# Patient Record
Sex: Female | Born: 1945 | Race: White | Hispanic: No | Marital: Married | Smoking: Never smoker
Health system: Southern US, Community
[De-identification: ages and names within clinical notes are randomized; demographics above are authoritative.]

## PROBLEM LIST (undated history)

## (undated) DIAGNOSIS — I2510 Atherosclerotic heart disease of native coronary artery without angina pectoris: Secondary | ICD-10-CM

## (undated) DIAGNOSIS — I499 Cardiac arrhythmia, unspecified: Secondary | ICD-10-CM

## (undated) DIAGNOSIS — Z9889 Other specified postprocedural states: Secondary | ICD-10-CM

## (undated) DIAGNOSIS — K219 Gastro-esophageal reflux disease without esophagitis: Secondary | ICD-10-CM

## (undated) DIAGNOSIS — M1990 Unspecified osteoarthritis, unspecified site: Secondary | ICD-10-CM

## (undated) DIAGNOSIS — I10 Essential (primary) hypertension: Secondary | ICD-10-CM

## (undated) DIAGNOSIS — R112 Nausea with vomiting, unspecified: Secondary | ICD-10-CM

## (undated) DIAGNOSIS — F419 Anxiety disorder, unspecified: Secondary | ICD-10-CM

## (undated) HISTORY — PX: FRACTURE SURGERY: SHX138

## (undated) HISTORY — PX: CHOLECYSTECTOMY: SHX55

## (undated) HISTORY — PX: TUBAL LIGATION: SHX77

## (undated) HISTORY — PX: EYE SURGERY: SHX253

## (undated) HISTORY — PX: BACK SURGERY: SHX140

---

## 2014-05-24 DIAGNOSIS — K219 Gastro-esophageal reflux disease without esophagitis: Secondary | ICD-10-CM | POA: Diagnosis not present

## 2014-05-24 DIAGNOSIS — J209 Acute bronchitis, unspecified: Secondary | ICD-10-CM | POA: Diagnosis not present

## 2014-08-09 DIAGNOSIS — I10 Essential (primary) hypertension: Secondary | ICD-10-CM | POA: Diagnosis not present

## 2014-08-09 DIAGNOSIS — Z0000 Encounter for general adult medical examination without abnormal findings: Secondary | ICD-10-CM | POA: Diagnosis not present

## 2014-08-09 DIAGNOSIS — D649 Anemia, unspecified: Secondary | ICD-10-CM | POA: Diagnosis not present

## 2014-08-09 DIAGNOSIS — E780 Pure hypercholesterolemia: Secondary | ICD-10-CM | POA: Diagnosis not present

## 2014-08-09 DIAGNOSIS — Z79899 Other long term (current) drug therapy: Secondary | ICD-10-CM | POA: Diagnosis not present

## 2014-08-09 DIAGNOSIS — E876 Hypokalemia: Secondary | ICD-10-CM | POA: Diagnosis not present

## 2014-08-21 DIAGNOSIS — Z1231 Encounter for screening mammogram for malignant neoplasm of breast: Secondary | ICD-10-CM | POA: Diagnosis not present

## 2014-09-05 DIAGNOSIS — K5730 Diverticulosis of large intestine without perforation or abscess without bleeding: Secondary | ICD-10-CM | POA: Diagnosis not present

## 2014-09-05 DIAGNOSIS — Z86010 Personal history of colonic polyps: Secondary | ICD-10-CM | POA: Diagnosis not present

## 2014-09-05 DIAGNOSIS — R194 Change in bowel habit: Secondary | ICD-10-CM | POA: Diagnosis not present

## 2014-10-04 DIAGNOSIS — H524 Presbyopia: Secondary | ICD-10-CM | POA: Diagnosis not present

## 2014-10-04 DIAGNOSIS — H5210 Myopia, unspecified eye: Secondary | ICD-10-CM | POA: Diagnosis not present

## 2014-10-15 DIAGNOSIS — N12 Tubulo-interstitial nephritis, not specified as acute or chronic: Secondary | ICD-10-CM | POA: Diagnosis not present

## 2014-10-17 DIAGNOSIS — N12 Tubulo-interstitial nephritis, not specified as acute or chronic: Secondary | ICD-10-CM | POA: Diagnosis not present

## 2014-11-21 DIAGNOSIS — M545 Low back pain: Secondary | ICD-10-CM | POA: Diagnosis not present

## 2014-11-21 DIAGNOSIS — N390 Urinary tract infection, site not specified: Secondary | ICD-10-CM | POA: Diagnosis not present

## 2014-11-28 DIAGNOSIS — M546 Pain in thoracic spine: Secondary | ICD-10-CM | POA: Diagnosis not present

## 2014-11-28 DIAGNOSIS — M5489 Other dorsalgia: Secondary | ICD-10-CM | POA: Diagnosis not present

## 2014-11-29 DIAGNOSIS — M5489 Other dorsalgia: Secondary | ICD-10-CM | POA: Diagnosis not present

## 2014-11-29 DIAGNOSIS — M546 Pain in thoracic spine: Secondary | ICD-10-CM | POA: Diagnosis not present

## 2014-12-03 DIAGNOSIS — M546 Pain in thoracic spine: Secondary | ICD-10-CM | POA: Diagnosis not present

## 2014-12-03 DIAGNOSIS — M5489 Other dorsalgia: Secondary | ICD-10-CM | POA: Diagnosis not present

## 2014-12-07 DIAGNOSIS — M546 Pain in thoracic spine: Secondary | ICD-10-CM | POA: Diagnosis not present

## 2014-12-07 DIAGNOSIS — M5489 Other dorsalgia: Secondary | ICD-10-CM | POA: Diagnosis not present

## 2014-12-12 DIAGNOSIS — M546 Pain in thoracic spine: Secondary | ICD-10-CM | POA: Diagnosis not present

## 2014-12-12 DIAGNOSIS — M5489 Other dorsalgia: Secondary | ICD-10-CM | POA: Diagnosis not present

## 2014-12-14 DIAGNOSIS — M5489 Other dorsalgia: Secondary | ICD-10-CM | POA: Diagnosis not present

## 2014-12-14 DIAGNOSIS — M546 Pain in thoracic spine: Secondary | ICD-10-CM | POA: Diagnosis not present

## 2014-12-17 DIAGNOSIS — M5489 Other dorsalgia: Secondary | ICD-10-CM | POA: Diagnosis not present

## 2014-12-17 DIAGNOSIS — M546 Pain in thoracic spine: Secondary | ICD-10-CM | POA: Diagnosis not present

## 2014-12-20 DIAGNOSIS — M546 Pain in thoracic spine: Secondary | ICD-10-CM | POA: Diagnosis not present

## 2014-12-20 DIAGNOSIS — M5489 Other dorsalgia: Secondary | ICD-10-CM | POA: Diagnosis not present

## 2014-12-24 DIAGNOSIS — M5489 Other dorsalgia: Secondary | ICD-10-CM | POA: Diagnosis not present

## 2014-12-24 DIAGNOSIS — M546 Pain in thoracic spine: Secondary | ICD-10-CM | POA: Diagnosis not present

## 2014-12-28 DIAGNOSIS — M5489 Other dorsalgia: Secondary | ICD-10-CM | POA: Diagnosis not present

## 2014-12-28 DIAGNOSIS — M546 Pain in thoracic spine: Secondary | ICD-10-CM | POA: Diagnosis not present

## 2014-12-31 DIAGNOSIS — N390 Urinary tract infection, site not specified: Secondary | ICD-10-CM | POA: Diagnosis not present

## 2014-12-31 DIAGNOSIS — M5489 Other dorsalgia: Secondary | ICD-10-CM | POA: Diagnosis not present

## 2014-12-31 DIAGNOSIS — Z23 Encounter for immunization: Secondary | ICD-10-CM | POA: Diagnosis not present

## 2014-12-31 DIAGNOSIS — M546 Pain in thoracic spine: Secondary | ICD-10-CM | POA: Diagnosis not present

## 2015-01-02 DIAGNOSIS — M546 Pain in thoracic spine: Secondary | ICD-10-CM | POA: Diagnosis not present

## 2015-01-02 DIAGNOSIS — M5489 Other dorsalgia: Secondary | ICD-10-CM | POA: Diagnosis not present

## 2015-01-07 DIAGNOSIS — M546 Pain in thoracic spine: Secondary | ICD-10-CM | POA: Diagnosis not present

## 2015-01-07 DIAGNOSIS — M5489 Other dorsalgia: Secondary | ICD-10-CM | POA: Diagnosis not present

## 2015-01-09 DIAGNOSIS — M5489 Other dorsalgia: Secondary | ICD-10-CM | POA: Diagnosis not present

## 2015-01-09 DIAGNOSIS — M546 Pain in thoracic spine: Secondary | ICD-10-CM | POA: Diagnosis not present

## 2015-01-14 DIAGNOSIS — M5489 Other dorsalgia: Secondary | ICD-10-CM | POA: Diagnosis not present

## 2015-01-14 DIAGNOSIS — M546 Pain in thoracic spine: Secondary | ICD-10-CM | POA: Diagnosis not present

## 2015-01-16 DIAGNOSIS — M5489 Other dorsalgia: Secondary | ICD-10-CM | POA: Diagnosis not present

## 2015-01-16 DIAGNOSIS — M546 Pain in thoracic spine: Secondary | ICD-10-CM | POA: Diagnosis not present

## 2015-05-20 DIAGNOSIS — M25551 Pain in right hip: Secondary | ICD-10-CM | POA: Diagnosis not present

## 2015-05-20 DIAGNOSIS — M5431 Sciatica, right side: Secondary | ICD-10-CM | POA: Diagnosis not present

## 2015-05-20 DIAGNOSIS — M5136 Other intervertebral disc degeneration, lumbar region: Secondary | ICD-10-CM | POA: Diagnosis not present

## 2015-05-20 DIAGNOSIS — M4807 Spinal stenosis, lumbosacral region: Secondary | ICD-10-CM | POA: Diagnosis not present

## 2015-08-28 DIAGNOSIS — D649 Anemia, unspecified: Secondary | ICD-10-CM | POA: Diagnosis not present

## 2015-08-28 DIAGNOSIS — Z79899 Other long term (current) drug therapy: Secondary | ICD-10-CM | POA: Diagnosis not present

## 2015-08-28 DIAGNOSIS — Z1231 Encounter for screening mammogram for malignant neoplasm of breast: Secondary | ICD-10-CM | POA: Diagnosis not present

## 2015-08-28 DIAGNOSIS — E876 Hypokalemia: Secondary | ICD-10-CM | POA: Diagnosis not present

## 2015-08-28 DIAGNOSIS — I10 Essential (primary) hypertension: Secondary | ICD-10-CM | POA: Diagnosis not present

## 2015-08-28 DIAGNOSIS — E785 Hyperlipidemia, unspecified: Secondary | ICD-10-CM | POA: Diagnosis not present

## 2015-08-28 DIAGNOSIS — M545 Low back pain: Secondary | ICD-10-CM | POA: Diagnosis not present

## 2015-08-28 DIAGNOSIS — R635 Abnormal weight gain: Secondary | ICD-10-CM | POA: Diagnosis not present

## 2015-08-28 DIAGNOSIS — Z0000 Encounter for general adult medical examination without abnormal findings: Secondary | ICD-10-CM | POA: Diagnosis not present

## 2015-09-06 DIAGNOSIS — Z1231 Encounter for screening mammogram for malignant neoplasm of breast: Secondary | ICD-10-CM | POA: Diagnosis not present

## 2015-09-25 DIAGNOSIS — N6489 Other specified disorders of breast: Secondary | ICD-10-CM | POA: Diagnosis not present

## 2015-09-25 DIAGNOSIS — R928 Other abnormal and inconclusive findings on diagnostic imaging of breast: Secondary | ICD-10-CM | POA: Diagnosis not present

## 2015-10-29 DIAGNOSIS — Z9181 History of falling: Secondary | ICD-10-CM | POA: Diagnosis not present

## 2015-10-29 DIAGNOSIS — R635 Abnormal weight gain: Secondary | ICD-10-CM | POA: Diagnosis not present

## 2015-10-29 DIAGNOSIS — Z1389 Encounter for screening for other disorder: Secondary | ICD-10-CM | POA: Diagnosis not present

## 2015-11-21 DIAGNOSIS — H524 Presbyopia: Secondary | ICD-10-CM | POA: Diagnosis not present

## 2015-11-21 DIAGNOSIS — H5210 Myopia, unspecified eye: Secondary | ICD-10-CM | POA: Diagnosis not present

## 2015-12-11 DIAGNOSIS — F410 Panic disorder [episodic paroxysmal anxiety] without agoraphobia: Secondary | ICD-10-CM | POA: Diagnosis not present

## 2015-12-11 DIAGNOSIS — J0120 Acute ethmoidal sinusitis, unspecified: Secondary | ICD-10-CM | POA: Diagnosis not present

## 2016-01-09 DIAGNOSIS — Z23 Encounter for immunization: Secondary | ICD-10-CM | POA: Diagnosis not present

## 2016-01-22 DIAGNOSIS — J0120 Acute ethmoidal sinusitis, unspecified: Secondary | ICD-10-CM | POA: Diagnosis not present

## 2016-11-24 DIAGNOSIS — K219 Gastro-esophageal reflux disease without esophagitis: Secondary | ICD-10-CM | POA: Diagnosis not present

## 2016-11-24 DIAGNOSIS — Z6823 Body mass index (BMI) 23.0-23.9, adult: Secondary | ICD-10-CM | POA: Diagnosis not present

## 2016-11-24 DIAGNOSIS — F410 Panic disorder [episodic paroxysmal anxiety] without agoraphobia: Secondary | ICD-10-CM | POA: Diagnosis not present

## 2016-11-24 DIAGNOSIS — Z0000 Encounter for general adult medical examination without abnormal findings: Secondary | ICD-10-CM | POA: Diagnosis not present

## 2016-11-24 DIAGNOSIS — Z79899 Other long term (current) drug therapy: Secondary | ICD-10-CM | POA: Diagnosis not present

## 2016-11-24 DIAGNOSIS — I10 Essential (primary) hypertension: Secondary | ICD-10-CM | POA: Diagnosis not present

## 2016-11-24 DIAGNOSIS — Z9181 History of falling: Secondary | ICD-10-CM | POA: Diagnosis not present

## 2016-11-24 DIAGNOSIS — E785 Hyperlipidemia, unspecified: Secondary | ICD-10-CM | POA: Diagnosis not present

## 2016-11-24 DIAGNOSIS — Z86010 Personal history of colonic polyps: Secondary | ICD-10-CM | POA: Diagnosis not present

## 2016-11-26 DIAGNOSIS — H5213 Myopia, bilateral: Secondary | ICD-10-CM | POA: Diagnosis not present

## 2016-12-07 DIAGNOSIS — H2513 Age-related nuclear cataract, bilateral: Secondary | ICD-10-CM | POA: Diagnosis not present

## 2016-12-07 DIAGNOSIS — H2511 Age-related nuclear cataract, right eye: Secondary | ICD-10-CM | POA: Diagnosis not present

## 2016-12-24 DIAGNOSIS — Z1231 Encounter for screening mammogram for malignant neoplasm of breast: Secondary | ICD-10-CM | POA: Diagnosis not present

## 2017-01-06 DIAGNOSIS — Z23 Encounter for immunization: Secondary | ICD-10-CM | POA: Diagnosis not present

## 2017-01-14 DIAGNOSIS — H2511 Age-related nuclear cataract, right eye: Secondary | ICD-10-CM | POA: Diagnosis not present

## 2017-01-15 DIAGNOSIS — H2512 Age-related nuclear cataract, left eye: Secondary | ICD-10-CM | POA: Diagnosis not present

## 2017-01-28 DIAGNOSIS — H2512 Age-related nuclear cataract, left eye: Secondary | ICD-10-CM | POA: Diagnosis not present

## 2017-02-05 DIAGNOSIS — S3991XA Unspecified injury of abdomen, initial encounter: Secondary | ICD-10-CM | POA: Diagnosis not present

## 2017-02-05 DIAGNOSIS — S31609A Unspecified open wound of abdominal wall, unspecified quadrant with penetration into peritoneal cavity, initial encounter: Secondary | ICD-10-CM | POA: Diagnosis not present

## 2017-02-05 DIAGNOSIS — S0990XA Unspecified injury of head, initial encounter: Secondary | ICD-10-CM | POA: Diagnosis not present

## 2017-02-05 DIAGNOSIS — R42 Dizziness and giddiness: Secondary | ICD-10-CM | POA: Diagnosis not present

## 2017-02-05 DIAGNOSIS — S199XXA Unspecified injury of neck, initial encounter: Secondary | ICD-10-CM | POA: Diagnosis not present

## 2017-02-05 DIAGNOSIS — M79604 Pain in right leg: Secondary | ICD-10-CM | POA: Diagnosis not present

## 2017-02-05 DIAGNOSIS — Z79899 Other long term (current) drug therapy: Secondary | ICD-10-CM | POA: Diagnosis not present

## 2017-02-05 DIAGNOSIS — Z7982 Long term (current) use of aspirin: Secondary | ICD-10-CM | POA: Diagnosis not present

## 2017-02-05 DIAGNOSIS — S3690XA Unspecified injury of unspecified intra-abdominal organ, initial encounter: Secondary | ICD-10-CM | POA: Diagnosis not present

## 2017-02-05 DIAGNOSIS — R109 Unspecified abdominal pain: Secondary | ICD-10-CM | POA: Diagnosis not present

## 2017-02-05 DIAGNOSIS — S2241XA Multiple fractures of ribs, right side, initial encounter for closed fracture: Secondary | ICD-10-CM | POA: Diagnosis not present

## 2017-02-05 DIAGNOSIS — I10 Essential (primary) hypertension: Secondary | ICD-10-CM | POA: Diagnosis not present

## 2017-02-06 DIAGNOSIS — R0781 Pleurodynia: Secondary | ICD-10-CM | POA: Diagnosis not present

## 2017-02-06 DIAGNOSIS — I10 Essential (primary) hypertension: Secondary | ICD-10-CM | POA: Diagnosis not present

## 2017-02-06 DIAGNOSIS — R079 Chest pain, unspecified: Secondary | ICD-10-CM | POA: Diagnosis not present

## 2017-02-06 DIAGNOSIS — Z7982 Long term (current) use of aspirin: Secondary | ICD-10-CM | POA: Diagnosis not present

## 2017-02-06 DIAGNOSIS — Y998 Other external cause status: Secondary | ICD-10-CM | POA: Diagnosis not present

## 2017-02-06 DIAGNOSIS — Z7409 Other reduced mobility: Secondary | ICD-10-CM | POA: Diagnosis not present

## 2017-02-06 DIAGNOSIS — S2241XA Multiple fractures of ribs, right side, initial encounter for closed fracture: Secondary | ICD-10-CM | POA: Diagnosis not present

## 2017-02-06 DIAGNOSIS — Y92410 Unspecified street and highway as the place of occurrence of the external cause: Secondary | ICD-10-CM | POA: Diagnosis not present

## 2017-02-06 DIAGNOSIS — R0602 Shortness of breath: Secondary | ICD-10-CM | POA: Diagnosis not present

## 2017-02-17 DIAGNOSIS — S2241XD Multiple fractures of ribs, right side, subsequent encounter for fracture with routine healing: Secondary | ICD-10-CM | POA: Diagnosis not present

## 2017-03-01 DIAGNOSIS — Z1339 Encounter for screening examination for other mental health and behavioral disorders: Secondary | ICD-10-CM | POA: Diagnosis not present

## 2017-03-01 DIAGNOSIS — Z6823 Body mass index (BMI) 23.0-23.9, adult: Secondary | ICD-10-CM | POA: Diagnosis not present

## 2017-03-01 DIAGNOSIS — M5432 Sciatica, left side: Secondary | ICD-10-CM | POA: Diagnosis not present

## 2017-03-17 DIAGNOSIS — M5431 Sciatica, right side: Secondary | ICD-10-CM | POA: Diagnosis not present

## 2017-03-17 DIAGNOSIS — Z6823 Body mass index (BMI) 23.0-23.9, adult: Secondary | ICD-10-CM | POA: Diagnosis not present

## 2017-03-30 ENCOUNTER — Other Ambulatory Visit: Payer: Self-pay | Admitting: Family Medicine

## 2017-03-30 DIAGNOSIS — M5431 Sciatica, right side: Secondary | ICD-10-CM

## 2017-04-02 ENCOUNTER — Inpatient Hospital Stay
Admission: RE | Admit: 2017-04-02 | Discharge: 2017-04-02 | Disposition: A | Discharge status: Home / Self Care | Payer: Self-pay | Source: Ambulatory Visit | Attending: Family Medicine | Admitting: Family Medicine

## 2017-04-16 ENCOUNTER — Ambulatory Visit
Admission: RE | Admit: 2017-04-16 | Discharge: 2017-04-16 | Disposition: A | Discharge status: Home / Self Care | Payer: Medicare HMO | Source: Ambulatory Visit | Attending: Family Medicine | Admitting: Family Medicine

## 2017-04-16 DIAGNOSIS — M5431 Sciatica, right side: Secondary | ICD-10-CM

## 2017-04-16 DIAGNOSIS — M48061 Spinal stenosis, lumbar region without neurogenic claudication: Secondary | ICD-10-CM | POA: Diagnosis not present

## 2017-04-29 DIAGNOSIS — M4316 Spondylolisthesis, lumbar region: Secondary | ICD-10-CM | POA: Diagnosis not present

## 2017-04-29 DIAGNOSIS — M5416 Radiculopathy, lumbar region: Secondary | ICD-10-CM | POA: Diagnosis not present

## 2017-04-29 DIAGNOSIS — M545 Low back pain: Secondary | ICD-10-CM | POA: Diagnosis not present

## 2017-04-29 DIAGNOSIS — M47816 Spondylosis without myelopathy or radiculopathy, lumbar region: Secondary | ICD-10-CM | POA: Diagnosis not present

## 2017-05-12 DIAGNOSIS — M5416 Radiculopathy, lumbar region: Secondary | ICD-10-CM | POA: Diagnosis not present

## 2017-05-17 DIAGNOSIS — Z961 Presence of intraocular lens: Secondary | ICD-10-CM | POA: Diagnosis not present

## 2017-05-17 DIAGNOSIS — H1820 Unspecified corneal edema: Secondary | ICD-10-CM | POA: Diagnosis not present

## 2017-06-01 DIAGNOSIS — H1851 Endothelial corneal dystrophy: Secondary | ICD-10-CM | POA: Diagnosis not present

## 2017-06-01 DIAGNOSIS — Z961 Presence of intraocular lens: Secondary | ICD-10-CM | POA: Diagnosis not present

## 2017-06-07 DIAGNOSIS — M5416 Radiculopathy, lumbar region: Secondary | ICD-10-CM | POA: Diagnosis not present

## 2017-06-23 DIAGNOSIS — H1851 Endothelial corneal dystrophy: Secondary | ICD-10-CM | POA: Diagnosis not present

## 2017-06-23 DIAGNOSIS — Z961 Presence of intraocular lens: Secondary | ICD-10-CM | POA: Diagnosis not present

## 2017-07-06 DIAGNOSIS — M7062 Trochanteric bursitis, left hip: Secondary | ICD-10-CM | POA: Diagnosis not present

## 2017-07-06 DIAGNOSIS — M47816 Spondylosis without myelopathy or radiculopathy, lumbar region: Secondary | ICD-10-CM | POA: Diagnosis not present

## 2017-07-13 DIAGNOSIS — M7062 Trochanteric bursitis, left hip: Secondary | ICD-10-CM | POA: Diagnosis not present

## 2017-07-13 DIAGNOSIS — M47816 Spondylosis without myelopathy or radiculopathy, lumbar region: Secondary | ICD-10-CM | POA: Diagnosis not present

## 2017-07-15 DIAGNOSIS — M7062 Trochanteric bursitis, left hip: Secondary | ICD-10-CM | POA: Diagnosis not present

## 2017-07-15 DIAGNOSIS — M47816 Spondylosis without myelopathy or radiculopathy, lumbar region: Secondary | ICD-10-CM | POA: Diagnosis not present

## 2017-07-20 DIAGNOSIS — R69 Illness, unspecified: Secondary | ICD-10-CM | POA: Diagnosis not present

## 2017-07-22 DIAGNOSIS — M7062 Trochanteric bursitis, left hip: Secondary | ICD-10-CM | POA: Diagnosis not present

## 2017-07-22 DIAGNOSIS — M47816 Spondylosis without myelopathy or radiculopathy, lumbar region: Secondary | ICD-10-CM | POA: Diagnosis not present

## 2017-07-27 DIAGNOSIS — M7062 Trochanteric bursitis, left hip: Secondary | ICD-10-CM | POA: Diagnosis not present

## 2017-07-27 DIAGNOSIS — M47816 Spondylosis without myelopathy or radiculopathy, lumbar region: Secondary | ICD-10-CM | POA: Diagnosis not present

## 2017-07-29 DIAGNOSIS — M7062 Trochanteric bursitis, left hip: Secondary | ICD-10-CM | POA: Diagnosis not present

## 2017-07-29 DIAGNOSIS — M47816 Spondylosis without myelopathy or radiculopathy, lumbar region: Secondary | ICD-10-CM | POA: Diagnosis not present

## 2017-08-02 DIAGNOSIS — M47816 Spondylosis without myelopathy or radiculopathy, lumbar region: Secondary | ICD-10-CM | POA: Diagnosis not present

## 2017-08-02 DIAGNOSIS — M7062 Trochanteric bursitis, left hip: Secondary | ICD-10-CM | POA: Diagnosis not present

## 2017-08-04 DIAGNOSIS — H04123 Dry eye syndrome of bilateral lacrimal glands: Secondary | ICD-10-CM | POA: Diagnosis not present

## 2017-08-04 DIAGNOSIS — Z961 Presence of intraocular lens: Secondary | ICD-10-CM | POA: Diagnosis not present

## 2017-08-04 DIAGNOSIS — H1851 Endothelial corneal dystrophy: Secondary | ICD-10-CM | POA: Diagnosis not present

## 2017-08-05 DIAGNOSIS — M7062 Trochanteric bursitis, left hip: Secondary | ICD-10-CM | POA: Diagnosis not present

## 2017-08-05 DIAGNOSIS — M47816 Spondylosis without myelopathy or radiculopathy, lumbar region: Secondary | ICD-10-CM | POA: Diagnosis not present

## 2017-08-12 DIAGNOSIS — M7062 Trochanteric bursitis, left hip: Secondary | ICD-10-CM | POA: Diagnosis not present

## 2017-08-12 DIAGNOSIS — M47816 Spondylosis without myelopathy or radiculopathy, lumbar region: Secondary | ICD-10-CM | POA: Diagnosis not present

## 2017-08-13 DIAGNOSIS — M47816 Spondylosis without myelopathy or radiculopathy, lumbar region: Secondary | ICD-10-CM | POA: Diagnosis not present

## 2017-08-13 DIAGNOSIS — M7062 Trochanteric bursitis, left hip: Secondary | ICD-10-CM | POA: Diagnosis not present

## 2017-08-24 DIAGNOSIS — M5416 Radiculopathy, lumbar region: Secondary | ICD-10-CM | POA: Diagnosis not present

## 2017-08-24 DIAGNOSIS — M47816 Spondylosis without myelopathy or radiculopathy, lumbar region: Secondary | ICD-10-CM | POA: Diagnosis not present

## 2017-08-24 DIAGNOSIS — M7062 Trochanteric bursitis, left hip: Secondary | ICD-10-CM | POA: Diagnosis not present

## 2017-08-24 DIAGNOSIS — M4316 Spondylolisthesis, lumbar region: Secondary | ICD-10-CM | POA: Diagnosis not present

## 2017-09-24 DIAGNOSIS — M47816 Spondylosis without myelopathy or radiculopathy, lumbar region: Secondary | ICD-10-CM | POA: Diagnosis not present

## 2017-09-24 DIAGNOSIS — M7062 Trochanteric bursitis, left hip: Secondary | ICD-10-CM | POA: Diagnosis not present

## 2017-09-27 DIAGNOSIS — M47816 Spondylosis without myelopathy or radiculopathy, lumbar region: Secondary | ICD-10-CM | POA: Diagnosis not present

## 2017-09-27 DIAGNOSIS — M7062 Trochanteric bursitis, left hip: Secondary | ICD-10-CM | POA: Diagnosis not present

## 2017-09-29 DIAGNOSIS — M7062 Trochanteric bursitis, left hip: Secondary | ICD-10-CM | POA: Diagnosis not present

## 2017-09-29 DIAGNOSIS — M47816 Spondylosis without myelopathy or radiculopathy, lumbar region: Secondary | ICD-10-CM | POA: Diagnosis not present

## 2017-10-04 DIAGNOSIS — M7062 Trochanteric bursitis, left hip: Secondary | ICD-10-CM | POA: Diagnosis not present

## 2017-10-04 DIAGNOSIS — M47816 Spondylosis without myelopathy or radiculopathy, lumbar region: Secondary | ICD-10-CM | POA: Diagnosis not present

## 2017-10-14 DIAGNOSIS — M47816 Spondylosis without myelopathy or radiculopathy, lumbar region: Secondary | ICD-10-CM | POA: Diagnosis not present

## 2017-10-14 DIAGNOSIS — M7062 Trochanteric bursitis, left hip: Secondary | ICD-10-CM | POA: Diagnosis not present

## 2017-12-01 DIAGNOSIS — I10 Essential (primary) hypertension: Secondary | ICD-10-CM | POA: Diagnosis not present

## 2017-12-01 DIAGNOSIS — K219 Gastro-esophageal reflux disease without esophagitis: Secondary | ICD-10-CM | POA: Diagnosis not present

## 2017-12-01 DIAGNOSIS — M5442 Lumbago with sciatica, left side: Secondary | ICD-10-CM | POA: Diagnosis not present

## 2017-12-01 DIAGNOSIS — Z0000 Encounter for general adult medical examination without abnormal findings: Secondary | ICD-10-CM | POA: Diagnosis not present

## 2017-12-01 DIAGNOSIS — Z23 Encounter for immunization: Secondary | ICD-10-CM | POA: Diagnosis not present

## 2017-12-01 DIAGNOSIS — Z1339 Encounter for screening examination for other mental health and behavioral disorders: Secondary | ICD-10-CM | POA: Diagnosis not present

## 2017-12-01 DIAGNOSIS — E785 Hyperlipidemia, unspecified: Secondary | ICD-10-CM | POA: Diagnosis not present

## 2017-12-01 DIAGNOSIS — Z79899 Other long term (current) drug therapy: Secondary | ICD-10-CM | POA: Diagnosis not present

## 2017-12-01 DIAGNOSIS — G8929 Other chronic pain: Secondary | ICD-10-CM | POA: Diagnosis not present

## 2017-12-14 DIAGNOSIS — H524 Presbyopia: Secondary | ICD-10-CM | POA: Diagnosis not present

## 2017-12-27 DIAGNOSIS — Z1231 Encounter for screening mammogram for malignant neoplasm of breast: Secondary | ICD-10-CM | POA: Diagnosis not present

## 2018-02-17 DIAGNOSIS — M5136 Other intervertebral disc degeneration, lumbar region: Secondary | ICD-10-CM | POA: Diagnosis not present

## 2018-02-17 DIAGNOSIS — M62830 Muscle spasm of back: Secondary | ICD-10-CM | POA: Diagnosis not present

## 2018-02-17 DIAGNOSIS — M9904 Segmental and somatic dysfunction of sacral region: Secondary | ICD-10-CM | POA: Diagnosis not present

## 2018-02-17 DIAGNOSIS — M9903 Segmental and somatic dysfunction of lumbar region: Secondary | ICD-10-CM | POA: Diagnosis not present

## 2018-02-17 DIAGNOSIS — M5137 Other intervertebral disc degeneration, lumbosacral region: Secondary | ICD-10-CM | POA: Diagnosis not present

## 2018-02-17 DIAGNOSIS — M5442 Lumbago with sciatica, left side: Secondary | ICD-10-CM | POA: Diagnosis not present

## 2018-02-22 DIAGNOSIS — M5137 Other intervertebral disc degeneration, lumbosacral region: Secondary | ICD-10-CM | POA: Diagnosis not present

## 2018-02-22 DIAGNOSIS — M9903 Segmental and somatic dysfunction of lumbar region: Secondary | ICD-10-CM | POA: Diagnosis not present

## 2018-02-22 DIAGNOSIS — M5442 Lumbago with sciatica, left side: Secondary | ICD-10-CM | POA: Diagnosis not present

## 2018-02-22 DIAGNOSIS — M62830 Muscle spasm of back: Secondary | ICD-10-CM | POA: Diagnosis not present

## 2018-02-22 DIAGNOSIS — M5136 Other intervertebral disc degeneration, lumbar region: Secondary | ICD-10-CM | POA: Diagnosis not present

## 2018-02-22 DIAGNOSIS — M9904 Segmental and somatic dysfunction of sacral region: Secondary | ICD-10-CM | POA: Diagnosis not present

## 2018-02-24 DIAGNOSIS — M5137 Other intervertebral disc degeneration, lumbosacral region: Secondary | ICD-10-CM | POA: Diagnosis not present

## 2018-02-24 DIAGNOSIS — M5136 Other intervertebral disc degeneration, lumbar region: Secondary | ICD-10-CM | POA: Diagnosis not present

## 2018-02-24 DIAGNOSIS — M62830 Muscle spasm of back: Secondary | ICD-10-CM | POA: Diagnosis not present

## 2018-02-24 DIAGNOSIS — M9904 Segmental and somatic dysfunction of sacral region: Secondary | ICD-10-CM | POA: Diagnosis not present

## 2018-02-24 DIAGNOSIS — M9903 Segmental and somatic dysfunction of lumbar region: Secondary | ICD-10-CM | POA: Diagnosis not present

## 2018-02-24 DIAGNOSIS — M5442 Lumbago with sciatica, left side: Secondary | ICD-10-CM | POA: Diagnosis not present

## 2018-02-25 DIAGNOSIS — M5137 Other intervertebral disc degeneration, lumbosacral region: Secondary | ICD-10-CM | POA: Diagnosis not present

## 2018-02-25 DIAGNOSIS — M9904 Segmental and somatic dysfunction of sacral region: Secondary | ICD-10-CM | POA: Diagnosis not present

## 2018-02-25 DIAGNOSIS — M62830 Muscle spasm of back: Secondary | ICD-10-CM | POA: Diagnosis not present

## 2018-02-25 DIAGNOSIS — M5136 Other intervertebral disc degeneration, lumbar region: Secondary | ICD-10-CM | POA: Diagnosis not present

## 2018-02-25 DIAGNOSIS — M9903 Segmental and somatic dysfunction of lumbar region: Secondary | ICD-10-CM | POA: Diagnosis not present

## 2018-02-25 DIAGNOSIS — M5442 Lumbago with sciatica, left side: Secondary | ICD-10-CM | POA: Diagnosis not present

## 2018-02-28 DIAGNOSIS — M9904 Segmental and somatic dysfunction of sacral region: Secondary | ICD-10-CM | POA: Diagnosis not present

## 2018-02-28 DIAGNOSIS — M62830 Muscle spasm of back: Secondary | ICD-10-CM | POA: Diagnosis not present

## 2018-02-28 DIAGNOSIS — M5442 Lumbago with sciatica, left side: Secondary | ICD-10-CM | POA: Diagnosis not present

## 2018-02-28 DIAGNOSIS — M5136 Other intervertebral disc degeneration, lumbar region: Secondary | ICD-10-CM | POA: Diagnosis not present

## 2018-02-28 DIAGNOSIS — M5137 Other intervertebral disc degeneration, lumbosacral region: Secondary | ICD-10-CM | POA: Diagnosis not present

## 2018-02-28 DIAGNOSIS — M9903 Segmental and somatic dysfunction of lumbar region: Secondary | ICD-10-CM | POA: Diagnosis not present

## 2018-03-03 DIAGNOSIS — M5136 Other intervertebral disc degeneration, lumbar region: Secondary | ICD-10-CM | POA: Diagnosis not present

## 2018-03-03 DIAGNOSIS — M62830 Muscle spasm of back: Secondary | ICD-10-CM | POA: Diagnosis not present

## 2018-03-03 DIAGNOSIS — M9904 Segmental and somatic dysfunction of sacral region: Secondary | ICD-10-CM | POA: Diagnosis not present

## 2018-03-03 DIAGNOSIS — M9903 Segmental and somatic dysfunction of lumbar region: Secondary | ICD-10-CM | POA: Diagnosis not present

## 2018-03-03 DIAGNOSIS — M5442 Lumbago with sciatica, left side: Secondary | ICD-10-CM | POA: Diagnosis not present

## 2018-03-03 DIAGNOSIS — M5137 Other intervertebral disc degeneration, lumbosacral region: Secondary | ICD-10-CM | POA: Diagnosis not present

## 2018-03-04 DIAGNOSIS — M5137 Other intervertebral disc degeneration, lumbosacral region: Secondary | ICD-10-CM | POA: Diagnosis not present

## 2018-03-04 DIAGNOSIS — M9904 Segmental and somatic dysfunction of sacral region: Secondary | ICD-10-CM | POA: Diagnosis not present

## 2018-03-04 DIAGNOSIS — M5442 Lumbago with sciatica, left side: Secondary | ICD-10-CM | POA: Diagnosis not present

## 2018-03-04 DIAGNOSIS — M62830 Muscle spasm of back: Secondary | ICD-10-CM | POA: Diagnosis not present

## 2018-03-04 DIAGNOSIS — M9903 Segmental and somatic dysfunction of lumbar region: Secondary | ICD-10-CM | POA: Diagnosis not present

## 2018-03-04 DIAGNOSIS — M5136 Other intervertebral disc degeneration, lumbar region: Secondary | ICD-10-CM | POA: Diagnosis not present

## 2018-03-07 DIAGNOSIS — M9904 Segmental and somatic dysfunction of sacral region: Secondary | ICD-10-CM | POA: Diagnosis not present

## 2018-03-07 DIAGNOSIS — M5442 Lumbago with sciatica, left side: Secondary | ICD-10-CM | POA: Diagnosis not present

## 2018-03-07 DIAGNOSIS — M62830 Muscle spasm of back: Secondary | ICD-10-CM | POA: Diagnosis not present

## 2018-03-07 DIAGNOSIS — M9903 Segmental and somatic dysfunction of lumbar region: Secondary | ICD-10-CM | POA: Diagnosis not present

## 2018-03-07 DIAGNOSIS — M5136 Other intervertebral disc degeneration, lumbar region: Secondary | ICD-10-CM | POA: Diagnosis not present

## 2018-03-07 DIAGNOSIS — M5137 Other intervertebral disc degeneration, lumbosacral region: Secondary | ICD-10-CM | POA: Diagnosis not present

## 2018-03-08 DIAGNOSIS — M5442 Lumbago with sciatica, left side: Secondary | ICD-10-CM | POA: Diagnosis not present

## 2018-03-08 DIAGNOSIS — M5136 Other intervertebral disc degeneration, lumbar region: Secondary | ICD-10-CM | POA: Diagnosis not present

## 2018-03-08 DIAGNOSIS — M62830 Muscle spasm of back: Secondary | ICD-10-CM | POA: Diagnosis not present

## 2018-03-08 DIAGNOSIS — M9904 Segmental and somatic dysfunction of sacral region: Secondary | ICD-10-CM | POA: Diagnosis not present

## 2018-03-08 DIAGNOSIS — M5137 Other intervertebral disc degeneration, lumbosacral region: Secondary | ICD-10-CM | POA: Diagnosis not present

## 2018-03-08 DIAGNOSIS — M9903 Segmental and somatic dysfunction of lumbar region: Secondary | ICD-10-CM | POA: Diagnosis not present

## 2018-03-15 DIAGNOSIS — M9904 Segmental and somatic dysfunction of sacral region: Secondary | ICD-10-CM | POA: Diagnosis not present

## 2018-03-15 DIAGNOSIS — M5137 Other intervertebral disc degeneration, lumbosacral region: Secondary | ICD-10-CM | POA: Diagnosis not present

## 2018-03-15 DIAGNOSIS — M62830 Muscle spasm of back: Secondary | ICD-10-CM | POA: Diagnosis not present

## 2018-03-15 DIAGNOSIS — M5136 Other intervertebral disc degeneration, lumbar region: Secondary | ICD-10-CM | POA: Diagnosis not present

## 2018-03-15 DIAGNOSIS — M5442 Lumbago with sciatica, left side: Secondary | ICD-10-CM | POA: Diagnosis not present

## 2018-03-15 DIAGNOSIS — M9903 Segmental and somatic dysfunction of lumbar region: Secondary | ICD-10-CM | POA: Diagnosis not present

## 2018-03-17 DIAGNOSIS — M62830 Muscle spasm of back: Secondary | ICD-10-CM | POA: Diagnosis not present

## 2018-03-17 DIAGNOSIS — M5136 Other intervertebral disc degeneration, lumbar region: Secondary | ICD-10-CM | POA: Diagnosis not present

## 2018-03-17 DIAGNOSIS — M5442 Lumbago with sciatica, left side: Secondary | ICD-10-CM | POA: Diagnosis not present

## 2018-03-17 DIAGNOSIS — M9904 Segmental and somatic dysfunction of sacral region: Secondary | ICD-10-CM | POA: Diagnosis not present

## 2018-03-17 DIAGNOSIS — M5137 Other intervertebral disc degeneration, lumbosacral region: Secondary | ICD-10-CM | POA: Diagnosis not present

## 2018-03-17 DIAGNOSIS — M9903 Segmental and somatic dysfunction of lumbar region: Secondary | ICD-10-CM | POA: Diagnosis not present

## 2018-03-18 DIAGNOSIS — M5136 Other intervertebral disc degeneration, lumbar region: Secondary | ICD-10-CM | POA: Diagnosis not present

## 2018-03-18 DIAGNOSIS — M5137 Other intervertebral disc degeneration, lumbosacral region: Secondary | ICD-10-CM | POA: Diagnosis not present

## 2018-03-18 DIAGNOSIS — M9903 Segmental and somatic dysfunction of lumbar region: Secondary | ICD-10-CM | POA: Diagnosis not present

## 2018-03-18 DIAGNOSIS — M9904 Segmental and somatic dysfunction of sacral region: Secondary | ICD-10-CM | POA: Diagnosis not present

## 2018-03-18 DIAGNOSIS — M62830 Muscle spasm of back: Secondary | ICD-10-CM | POA: Diagnosis not present

## 2018-03-18 DIAGNOSIS — M5442 Lumbago with sciatica, left side: Secondary | ICD-10-CM | POA: Diagnosis not present

## 2018-03-21 DIAGNOSIS — M62830 Muscle spasm of back: Secondary | ICD-10-CM | POA: Diagnosis not present

## 2018-03-21 DIAGNOSIS — M9903 Segmental and somatic dysfunction of lumbar region: Secondary | ICD-10-CM | POA: Diagnosis not present

## 2018-03-21 DIAGNOSIS — M5136 Other intervertebral disc degeneration, lumbar region: Secondary | ICD-10-CM | POA: Diagnosis not present

## 2018-03-21 DIAGNOSIS — M5137 Other intervertebral disc degeneration, lumbosacral region: Secondary | ICD-10-CM | POA: Diagnosis not present

## 2018-03-21 DIAGNOSIS — M5442 Lumbago with sciatica, left side: Secondary | ICD-10-CM | POA: Diagnosis not present

## 2018-03-21 DIAGNOSIS — M9904 Segmental and somatic dysfunction of sacral region: Secondary | ICD-10-CM | POA: Diagnosis not present

## 2018-03-24 DIAGNOSIS — M5136 Other intervertebral disc degeneration, lumbar region: Secondary | ICD-10-CM | POA: Diagnosis not present

## 2018-03-24 DIAGNOSIS — M9904 Segmental and somatic dysfunction of sacral region: Secondary | ICD-10-CM | POA: Diagnosis not present

## 2018-03-24 DIAGNOSIS — M5442 Lumbago with sciatica, left side: Secondary | ICD-10-CM | POA: Diagnosis not present

## 2018-03-24 DIAGNOSIS — M5137 Other intervertebral disc degeneration, lumbosacral region: Secondary | ICD-10-CM | POA: Diagnosis not present

## 2018-03-24 DIAGNOSIS — M62830 Muscle spasm of back: Secondary | ICD-10-CM | POA: Diagnosis not present

## 2018-03-24 DIAGNOSIS — M9903 Segmental and somatic dysfunction of lumbar region: Secondary | ICD-10-CM | POA: Diagnosis not present

## 2018-03-25 DIAGNOSIS — M5442 Lumbago with sciatica, left side: Secondary | ICD-10-CM | POA: Diagnosis not present

## 2018-03-25 DIAGNOSIS — M5136 Other intervertebral disc degeneration, lumbar region: Secondary | ICD-10-CM | POA: Diagnosis not present

## 2018-03-25 DIAGNOSIS — M62830 Muscle spasm of back: Secondary | ICD-10-CM | POA: Diagnosis not present

## 2018-03-25 DIAGNOSIS — M5137 Other intervertebral disc degeneration, lumbosacral region: Secondary | ICD-10-CM | POA: Diagnosis not present

## 2018-03-25 DIAGNOSIS — M9904 Segmental and somatic dysfunction of sacral region: Secondary | ICD-10-CM | POA: Diagnosis not present

## 2018-03-25 DIAGNOSIS — M9903 Segmental and somatic dysfunction of lumbar region: Secondary | ICD-10-CM | POA: Diagnosis not present

## 2018-03-28 DIAGNOSIS — M9903 Segmental and somatic dysfunction of lumbar region: Secondary | ICD-10-CM | POA: Diagnosis not present

## 2018-03-28 DIAGNOSIS — M5442 Lumbago with sciatica, left side: Secondary | ICD-10-CM | POA: Diagnosis not present

## 2018-03-28 DIAGNOSIS — M9904 Segmental and somatic dysfunction of sacral region: Secondary | ICD-10-CM | POA: Diagnosis not present

## 2018-03-28 DIAGNOSIS — M5137 Other intervertebral disc degeneration, lumbosacral region: Secondary | ICD-10-CM | POA: Diagnosis not present

## 2018-03-28 DIAGNOSIS — M5136 Other intervertebral disc degeneration, lumbar region: Secondary | ICD-10-CM | POA: Diagnosis not present

## 2018-03-28 DIAGNOSIS — M62830 Muscle spasm of back: Secondary | ICD-10-CM | POA: Diagnosis not present

## 2018-04-05 DIAGNOSIS — M5137 Other intervertebral disc degeneration, lumbosacral region: Secondary | ICD-10-CM | POA: Diagnosis not present

## 2018-04-05 DIAGNOSIS — M5442 Lumbago with sciatica, left side: Secondary | ICD-10-CM | POA: Diagnosis not present

## 2018-04-05 DIAGNOSIS — M5136 Other intervertebral disc degeneration, lumbar region: Secondary | ICD-10-CM | POA: Diagnosis not present

## 2018-04-05 DIAGNOSIS — M9903 Segmental and somatic dysfunction of lumbar region: Secondary | ICD-10-CM | POA: Diagnosis not present

## 2018-04-05 DIAGNOSIS — M9904 Segmental and somatic dysfunction of sacral region: Secondary | ICD-10-CM | POA: Diagnosis not present

## 2018-04-05 DIAGNOSIS — M62830 Muscle spasm of back: Secondary | ICD-10-CM | POA: Diagnosis not present

## 2018-04-05 DIAGNOSIS — H1820 Unspecified corneal edema: Secondary | ICD-10-CM | POA: Diagnosis not present

## 2018-04-05 DIAGNOSIS — H1851 Endothelial corneal dystrophy: Secondary | ICD-10-CM | POA: Diagnosis not present

## 2018-04-11 DIAGNOSIS — H1851 Endothelial corneal dystrophy: Secondary | ICD-10-CM | POA: Diagnosis not present

## 2018-04-12 DIAGNOSIS — M62830 Muscle spasm of back: Secondary | ICD-10-CM | POA: Diagnosis not present

## 2018-04-12 DIAGNOSIS — M5137 Other intervertebral disc degeneration, lumbosacral region: Secondary | ICD-10-CM | POA: Diagnosis not present

## 2018-04-12 DIAGNOSIS — M5442 Lumbago with sciatica, left side: Secondary | ICD-10-CM | POA: Diagnosis not present

## 2018-04-12 DIAGNOSIS — M5136 Other intervertebral disc degeneration, lumbar region: Secondary | ICD-10-CM | POA: Diagnosis not present

## 2018-04-12 DIAGNOSIS — M9904 Segmental and somatic dysfunction of sacral region: Secondary | ICD-10-CM | POA: Diagnosis not present

## 2018-04-12 DIAGNOSIS — M9903 Segmental and somatic dysfunction of lumbar region: Secondary | ICD-10-CM | POA: Diagnosis not present

## 2018-04-21 DIAGNOSIS — H1851 Endothelial corneal dystrophy: Secondary | ICD-10-CM | POA: Diagnosis not present

## 2018-04-22 DIAGNOSIS — M5137 Other intervertebral disc degeneration, lumbosacral region: Secondary | ICD-10-CM | POA: Diagnosis not present

## 2018-04-22 DIAGNOSIS — M62830 Muscle spasm of back: Secondary | ICD-10-CM | POA: Diagnosis not present

## 2018-04-22 DIAGNOSIS — M9904 Segmental and somatic dysfunction of sacral region: Secondary | ICD-10-CM | POA: Diagnosis not present

## 2018-04-22 DIAGNOSIS — M5442 Lumbago with sciatica, left side: Secondary | ICD-10-CM | POA: Diagnosis not present

## 2018-04-22 DIAGNOSIS — M5136 Other intervertebral disc degeneration, lumbar region: Secondary | ICD-10-CM | POA: Diagnosis not present

## 2018-04-22 DIAGNOSIS — M9903 Segmental and somatic dysfunction of lumbar region: Secondary | ICD-10-CM | POA: Diagnosis not present

## 2018-04-26 DIAGNOSIS — M9903 Segmental and somatic dysfunction of lumbar region: Secondary | ICD-10-CM | POA: Diagnosis not present

## 2018-04-26 DIAGNOSIS — M5137 Other intervertebral disc degeneration, lumbosacral region: Secondary | ICD-10-CM | POA: Diagnosis not present

## 2018-04-26 DIAGNOSIS — M62830 Muscle spasm of back: Secondary | ICD-10-CM | POA: Diagnosis not present

## 2018-04-26 DIAGNOSIS — M9904 Segmental and somatic dysfunction of sacral region: Secondary | ICD-10-CM | POA: Diagnosis not present

## 2018-04-26 DIAGNOSIS — M5442 Lumbago with sciatica, left side: Secondary | ICD-10-CM | POA: Diagnosis not present

## 2018-04-26 DIAGNOSIS — M5136 Other intervertebral disc degeneration, lumbar region: Secondary | ICD-10-CM | POA: Diagnosis not present

## 2018-05-10 DIAGNOSIS — M5136 Other intervertebral disc degeneration, lumbar region: Secondary | ICD-10-CM | POA: Diagnosis not present

## 2018-05-10 DIAGNOSIS — M9903 Segmental and somatic dysfunction of lumbar region: Secondary | ICD-10-CM | POA: Diagnosis not present

## 2018-05-10 DIAGNOSIS — M9904 Segmental and somatic dysfunction of sacral region: Secondary | ICD-10-CM | POA: Diagnosis not present

## 2018-05-10 DIAGNOSIS — M5137 Other intervertebral disc degeneration, lumbosacral region: Secondary | ICD-10-CM | POA: Diagnosis not present

## 2018-05-10 DIAGNOSIS — M62830 Muscle spasm of back: Secondary | ICD-10-CM | POA: Diagnosis not present

## 2018-05-10 DIAGNOSIS — M5442 Lumbago with sciatica, left side: Secondary | ICD-10-CM | POA: Diagnosis not present

## 2018-05-17 DIAGNOSIS — H1851 Endothelial corneal dystrophy: Secondary | ICD-10-CM | POA: Diagnosis not present

## 2018-05-17 DIAGNOSIS — H1820 Unspecified corneal edema: Secondary | ICD-10-CM | POA: Diagnosis not present

## 2018-05-24 DIAGNOSIS — M5442 Lumbago with sciatica, left side: Secondary | ICD-10-CM | POA: Diagnosis not present

## 2018-05-24 DIAGNOSIS — M62830 Muscle spasm of back: Secondary | ICD-10-CM | POA: Diagnosis not present

## 2018-05-24 DIAGNOSIS — M9904 Segmental and somatic dysfunction of sacral region: Secondary | ICD-10-CM | POA: Diagnosis not present

## 2018-05-24 DIAGNOSIS — M9903 Segmental and somatic dysfunction of lumbar region: Secondary | ICD-10-CM | POA: Diagnosis not present

## 2018-05-24 DIAGNOSIS — M5137 Other intervertebral disc degeneration, lumbosacral region: Secondary | ICD-10-CM | POA: Diagnosis not present

## 2018-05-24 DIAGNOSIS — M5136 Other intervertebral disc degeneration, lumbar region: Secondary | ICD-10-CM | POA: Diagnosis not present

## 2018-06-21 DIAGNOSIS — M62830 Muscle spasm of back: Secondary | ICD-10-CM | POA: Diagnosis not present

## 2018-06-21 DIAGNOSIS — M9903 Segmental and somatic dysfunction of lumbar region: Secondary | ICD-10-CM | POA: Diagnosis not present

## 2018-06-21 DIAGNOSIS — M9904 Segmental and somatic dysfunction of sacral region: Secondary | ICD-10-CM | POA: Diagnosis not present

## 2018-06-21 DIAGNOSIS — M5442 Lumbago with sciatica, left side: Secondary | ICD-10-CM | POA: Diagnosis not present

## 2018-06-21 DIAGNOSIS — M5136 Other intervertebral disc degeneration, lumbar region: Secondary | ICD-10-CM | POA: Diagnosis not present

## 2018-06-21 DIAGNOSIS — M5137 Other intervertebral disc degeneration, lumbosacral region: Secondary | ICD-10-CM | POA: Diagnosis not present

## 2018-07-19 DIAGNOSIS — M5137 Other intervertebral disc degeneration, lumbosacral region: Secondary | ICD-10-CM | POA: Diagnosis not present

## 2018-07-19 DIAGNOSIS — M62830 Muscle spasm of back: Secondary | ICD-10-CM | POA: Diagnosis not present

## 2018-07-19 DIAGNOSIS — M5136 Other intervertebral disc degeneration, lumbar region: Secondary | ICD-10-CM | POA: Diagnosis not present

## 2018-07-19 DIAGNOSIS — M9904 Segmental and somatic dysfunction of sacral region: Secondary | ICD-10-CM | POA: Diagnosis not present

## 2018-07-19 DIAGNOSIS — M9903 Segmental and somatic dysfunction of lumbar region: Secondary | ICD-10-CM | POA: Diagnosis not present

## 2018-07-19 DIAGNOSIS — M5442 Lumbago with sciatica, left side: Secondary | ICD-10-CM | POA: Diagnosis not present

## 2018-08-23 DIAGNOSIS — M9903 Segmental and somatic dysfunction of lumbar region: Secondary | ICD-10-CM | POA: Diagnosis not present

## 2018-08-23 DIAGNOSIS — M5137 Other intervertebral disc degeneration, lumbosacral region: Secondary | ICD-10-CM | POA: Diagnosis not present

## 2018-08-23 DIAGNOSIS — M9904 Segmental and somatic dysfunction of sacral region: Secondary | ICD-10-CM | POA: Diagnosis not present

## 2018-08-23 DIAGNOSIS — M5442 Lumbago with sciatica, left side: Secondary | ICD-10-CM | POA: Diagnosis not present

## 2018-08-23 DIAGNOSIS — M5136 Other intervertebral disc degeneration, lumbar region: Secondary | ICD-10-CM | POA: Diagnosis not present

## 2018-08-23 DIAGNOSIS — M62830 Muscle spasm of back: Secondary | ICD-10-CM | POA: Diagnosis not present

## 2018-09-06 DIAGNOSIS — J0120 Acute ethmoidal sinusitis, unspecified: Secondary | ICD-10-CM | POA: Diagnosis not present

## 2018-09-06 DIAGNOSIS — Z6824 Body mass index (BMI) 24.0-24.9, adult: Secondary | ICD-10-CM | POA: Diagnosis not present

## 2018-09-27 DIAGNOSIS — M5442 Lumbago with sciatica, left side: Secondary | ICD-10-CM | POA: Diagnosis not present

## 2018-09-27 DIAGNOSIS — M5136 Other intervertebral disc degeneration, lumbar region: Secondary | ICD-10-CM | POA: Diagnosis not present

## 2018-09-27 DIAGNOSIS — M62830 Muscle spasm of back: Secondary | ICD-10-CM | POA: Diagnosis not present

## 2018-09-27 DIAGNOSIS — M9903 Segmental and somatic dysfunction of lumbar region: Secondary | ICD-10-CM | POA: Diagnosis not present

## 2018-09-27 DIAGNOSIS — M9904 Segmental and somatic dysfunction of sacral region: Secondary | ICD-10-CM | POA: Diagnosis not present

## 2018-09-27 DIAGNOSIS — M5137 Other intervertebral disc degeneration, lumbosacral region: Secondary | ICD-10-CM | POA: Diagnosis not present

## 2018-10-25 DIAGNOSIS — M9904 Segmental and somatic dysfunction of sacral region: Secondary | ICD-10-CM | POA: Diagnosis not present

## 2018-10-25 DIAGNOSIS — M5136 Other intervertebral disc degeneration, lumbar region: Secondary | ICD-10-CM | POA: Diagnosis not present

## 2018-10-25 DIAGNOSIS — M62830 Muscle spasm of back: Secondary | ICD-10-CM | POA: Diagnosis not present

## 2018-10-25 DIAGNOSIS — M5137 Other intervertebral disc degeneration, lumbosacral region: Secondary | ICD-10-CM | POA: Diagnosis not present

## 2018-10-25 DIAGNOSIS — M9903 Segmental and somatic dysfunction of lumbar region: Secondary | ICD-10-CM | POA: Diagnosis not present

## 2018-10-25 DIAGNOSIS — M5442 Lumbago with sciatica, left side: Secondary | ICD-10-CM | POA: Diagnosis not present

## 2018-11-22 DIAGNOSIS — M9904 Segmental and somatic dysfunction of sacral region: Secondary | ICD-10-CM | POA: Diagnosis not present

## 2018-11-22 DIAGNOSIS — M62830 Muscle spasm of back: Secondary | ICD-10-CM | POA: Diagnosis not present

## 2018-11-22 DIAGNOSIS — M5137 Other intervertebral disc degeneration, lumbosacral region: Secondary | ICD-10-CM | POA: Diagnosis not present

## 2018-11-22 DIAGNOSIS — M5136 Other intervertebral disc degeneration, lumbar region: Secondary | ICD-10-CM | POA: Diagnosis not present

## 2018-11-22 DIAGNOSIS — M9903 Segmental and somatic dysfunction of lumbar region: Secondary | ICD-10-CM | POA: Diagnosis not present

## 2018-11-22 DIAGNOSIS — M5442 Lumbago with sciatica, left side: Secondary | ICD-10-CM | POA: Diagnosis not present

## 2018-12-20 DIAGNOSIS — M5442 Lumbago with sciatica, left side: Secondary | ICD-10-CM | POA: Diagnosis not present

## 2018-12-20 DIAGNOSIS — M5137 Other intervertebral disc degeneration, lumbosacral region: Secondary | ICD-10-CM | POA: Diagnosis not present

## 2018-12-20 DIAGNOSIS — M9903 Segmental and somatic dysfunction of lumbar region: Secondary | ICD-10-CM | POA: Diagnosis not present

## 2018-12-20 DIAGNOSIS — M62830 Muscle spasm of back: Secondary | ICD-10-CM | POA: Diagnosis not present

## 2018-12-20 DIAGNOSIS — H18513 Endothelial corneal dystrophy, bilateral: Secondary | ICD-10-CM | POA: Diagnosis not present

## 2018-12-20 DIAGNOSIS — M5136 Other intervertebral disc degeneration, lumbar region: Secondary | ICD-10-CM | POA: Diagnosis not present

## 2018-12-20 DIAGNOSIS — H5203 Hypermetropia, bilateral: Secondary | ICD-10-CM | POA: Diagnosis not present

## 2018-12-20 DIAGNOSIS — M9904 Segmental and somatic dysfunction of sacral region: Secondary | ICD-10-CM | POA: Diagnosis not present

## 2018-12-23 DIAGNOSIS — Z0000 Encounter for general adult medical examination without abnormal findings: Secondary | ICD-10-CM | POA: Diagnosis not present

## 2018-12-23 DIAGNOSIS — K219 Gastro-esophageal reflux disease without esophagitis: Secondary | ICD-10-CM | POA: Diagnosis not present

## 2018-12-23 DIAGNOSIS — Z1331 Encounter for screening for depression: Secondary | ICD-10-CM | POA: Diagnosis not present

## 2018-12-23 DIAGNOSIS — Z23 Encounter for immunization: Secondary | ICD-10-CM | POA: Diagnosis not present

## 2018-12-23 DIAGNOSIS — Z79899 Other long term (current) drug therapy: Secondary | ICD-10-CM | POA: Diagnosis not present

## 2018-12-23 DIAGNOSIS — I10 Essential (primary) hypertension: Secondary | ICD-10-CM | POA: Diagnosis not present

## 2018-12-23 DIAGNOSIS — Z6824 Body mass index (BMI) 24.0-24.9, adult: Secondary | ICD-10-CM | POA: Diagnosis not present

## 2018-12-23 DIAGNOSIS — F410 Panic disorder [episodic paroxysmal anxiety] without agoraphobia: Secondary | ICD-10-CM | POA: Diagnosis not present

## 2019-01-12 DIAGNOSIS — Z1231 Encounter for screening mammogram for malignant neoplasm of breast: Secondary | ICD-10-CM | POA: Diagnosis not present

## 2019-01-17 DIAGNOSIS — M5442 Lumbago with sciatica, left side: Secondary | ICD-10-CM | POA: Diagnosis not present

## 2019-01-17 DIAGNOSIS — M62830 Muscle spasm of back: Secondary | ICD-10-CM | POA: Diagnosis not present

## 2019-01-17 DIAGNOSIS — M5136 Other intervertebral disc degeneration, lumbar region: Secondary | ICD-10-CM | POA: Diagnosis not present

## 2019-01-17 DIAGNOSIS — M9904 Segmental and somatic dysfunction of sacral region: Secondary | ICD-10-CM | POA: Diagnosis not present

## 2019-01-17 DIAGNOSIS — M9903 Segmental and somatic dysfunction of lumbar region: Secondary | ICD-10-CM | POA: Diagnosis not present

## 2019-01-17 DIAGNOSIS — M5137 Other intervertebral disc degeneration, lumbosacral region: Secondary | ICD-10-CM | POA: Diagnosis not present

## 2019-01-27 DIAGNOSIS — J309 Allergic rhinitis, unspecified: Secondary | ICD-10-CM | POA: Diagnosis not present

## 2019-01-27 DIAGNOSIS — Z6824 Body mass index (BMI) 24.0-24.9, adult: Secondary | ICD-10-CM | POA: Diagnosis not present

## 2019-02-02 DIAGNOSIS — Z20828 Contact with and (suspected) exposure to other viral communicable diseases: Secondary | ICD-10-CM | POA: Diagnosis not present

## 2019-02-14 DIAGNOSIS — M5136 Other intervertebral disc degeneration, lumbar region: Secondary | ICD-10-CM | POA: Diagnosis not present

## 2019-02-14 DIAGNOSIS — M5442 Lumbago with sciatica, left side: Secondary | ICD-10-CM | POA: Diagnosis not present

## 2019-02-14 DIAGNOSIS — M62830 Muscle spasm of back: Secondary | ICD-10-CM | POA: Diagnosis not present

## 2019-02-14 DIAGNOSIS — M9904 Segmental and somatic dysfunction of sacral region: Secondary | ICD-10-CM | POA: Diagnosis not present

## 2019-02-14 DIAGNOSIS — M5137 Other intervertebral disc degeneration, lumbosacral region: Secondary | ICD-10-CM | POA: Diagnosis not present

## 2019-02-14 DIAGNOSIS — M9903 Segmental and somatic dysfunction of lumbar region: Secondary | ICD-10-CM | POA: Diagnosis not present

## 2019-03-29 DIAGNOSIS — M5136 Other intervertebral disc degeneration, lumbar region: Secondary | ICD-10-CM | POA: Diagnosis not present

## 2019-03-29 DIAGNOSIS — M9904 Segmental and somatic dysfunction of sacral region: Secondary | ICD-10-CM | POA: Diagnosis not present

## 2019-03-29 DIAGNOSIS — M5442 Lumbago with sciatica, left side: Secondary | ICD-10-CM | POA: Diagnosis not present

## 2019-03-29 DIAGNOSIS — M5137 Other intervertebral disc degeneration, lumbosacral region: Secondary | ICD-10-CM | POA: Diagnosis not present

## 2019-03-29 DIAGNOSIS — M62830 Muscle spasm of back: Secondary | ICD-10-CM | POA: Diagnosis not present

## 2019-03-29 DIAGNOSIS — M9903 Segmental and somatic dysfunction of lumbar region: Secondary | ICD-10-CM | POA: Diagnosis not present

## 2019-04-24 DIAGNOSIS — H18513 Endothelial corneal dystrophy, bilateral: Secondary | ICD-10-CM | POA: Diagnosis not present

## 2019-04-26 DIAGNOSIS — M5442 Lumbago with sciatica, left side: Secondary | ICD-10-CM | POA: Diagnosis not present

## 2019-04-26 DIAGNOSIS — M9903 Segmental and somatic dysfunction of lumbar region: Secondary | ICD-10-CM | POA: Diagnosis not present

## 2019-04-26 DIAGNOSIS — M5136 Other intervertebral disc degeneration, lumbar region: Secondary | ICD-10-CM | POA: Diagnosis not present

## 2019-04-26 DIAGNOSIS — M5137 Other intervertebral disc degeneration, lumbosacral region: Secondary | ICD-10-CM | POA: Diagnosis not present

## 2019-04-26 DIAGNOSIS — M62830 Muscle spasm of back: Secondary | ICD-10-CM | POA: Diagnosis not present

## 2019-04-26 DIAGNOSIS — M9904 Segmental and somatic dysfunction of sacral region: Secondary | ICD-10-CM | POA: Diagnosis not present

## 2019-05-24 DIAGNOSIS — M5136 Other intervertebral disc degeneration, lumbar region: Secondary | ICD-10-CM | POA: Diagnosis not present

## 2019-05-24 DIAGNOSIS — M5137 Other intervertebral disc degeneration, lumbosacral region: Secondary | ICD-10-CM | POA: Diagnosis not present

## 2019-05-24 DIAGNOSIS — M62830 Muscle spasm of back: Secondary | ICD-10-CM | POA: Diagnosis not present

## 2019-05-24 DIAGNOSIS — M5442 Lumbago with sciatica, left side: Secondary | ICD-10-CM | POA: Diagnosis not present

## 2019-05-24 DIAGNOSIS — M9904 Segmental and somatic dysfunction of sacral region: Secondary | ICD-10-CM | POA: Diagnosis not present

## 2019-05-24 DIAGNOSIS — M9903 Segmental and somatic dysfunction of lumbar region: Secondary | ICD-10-CM | POA: Diagnosis not present

## 2019-06-08 DIAGNOSIS — H1812 Bullous keratopathy, left eye: Secondary | ICD-10-CM | POA: Diagnosis not present

## 2019-06-08 DIAGNOSIS — Z961 Presence of intraocular lens: Secondary | ICD-10-CM | POA: Diagnosis not present

## 2019-06-08 DIAGNOSIS — H18519 Endothelial corneal dystrophy, unspecified eye: Secondary | ICD-10-CM | POA: Diagnosis not present

## 2019-06-21 DIAGNOSIS — M9904 Segmental and somatic dysfunction of sacral region: Secondary | ICD-10-CM | POA: Diagnosis not present

## 2019-06-21 DIAGNOSIS — M9903 Segmental and somatic dysfunction of lumbar region: Secondary | ICD-10-CM | POA: Diagnosis not present

## 2019-06-21 DIAGNOSIS — M5136 Other intervertebral disc degeneration, lumbar region: Secondary | ICD-10-CM | POA: Diagnosis not present

## 2019-06-21 DIAGNOSIS — M5137 Other intervertebral disc degeneration, lumbosacral region: Secondary | ICD-10-CM | POA: Diagnosis not present

## 2019-06-21 DIAGNOSIS — M5442 Lumbago with sciatica, left side: Secondary | ICD-10-CM | POA: Diagnosis not present

## 2019-06-21 DIAGNOSIS — M62830 Muscle spasm of back: Secondary | ICD-10-CM | POA: Diagnosis not present

## 2019-07-05 DIAGNOSIS — H18512 Endothelial corneal dystrophy, left eye: Secondary | ICD-10-CM | POA: Diagnosis not present

## 2019-07-05 DIAGNOSIS — I10 Essential (primary) hypertension: Secondary | ICD-10-CM | POA: Diagnosis not present

## 2019-07-05 DIAGNOSIS — M1990 Unspecified osteoarthritis, unspecified site: Secondary | ICD-10-CM | POA: Diagnosis not present

## 2019-07-05 DIAGNOSIS — Z7982 Long term (current) use of aspirin: Secondary | ICD-10-CM | POA: Diagnosis not present

## 2019-07-05 DIAGNOSIS — Z9841 Cataract extraction status, right eye: Secondary | ICD-10-CM | POA: Diagnosis not present

## 2019-07-05 DIAGNOSIS — H1812 Bullous keratopathy, left eye: Secondary | ICD-10-CM | POA: Diagnosis not present

## 2019-07-05 DIAGNOSIS — Z79899 Other long term (current) drug therapy: Secondary | ICD-10-CM | POA: Diagnosis not present

## 2019-07-05 DIAGNOSIS — K219 Gastro-esophageal reflux disease without esophagitis: Secondary | ICD-10-CM | POA: Diagnosis not present

## 2019-07-05 DIAGNOSIS — Z9842 Cataract extraction status, left eye: Secondary | ICD-10-CM | POA: Diagnosis not present

## 2019-08-10 DIAGNOSIS — M62830 Muscle spasm of back: Secondary | ICD-10-CM | POA: Diagnosis not present

## 2019-08-10 DIAGNOSIS — M9904 Segmental and somatic dysfunction of sacral region: Secondary | ICD-10-CM | POA: Diagnosis not present

## 2019-08-10 DIAGNOSIS — M5442 Lumbago with sciatica, left side: Secondary | ICD-10-CM | POA: Diagnosis not present

## 2019-08-10 DIAGNOSIS — M5137 Other intervertebral disc degeneration, lumbosacral region: Secondary | ICD-10-CM | POA: Diagnosis not present

## 2019-08-10 DIAGNOSIS — M5136 Other intervertebral disc degeneration, lumbar region: Secondary | ICD-10-CM | POA: Diagnosis not present

## 2019-08-10 DIAGNOSIS — M9903 Segmental and somatic dysfunction of lumbar region: Secondary | ICD-10-CM | POA: Diagnosis not present

## 2019-08-24 DIAGNOSIS — H18513 Endothelial corneal dystrophy, bilateral: Secondary | ICD-10-CM | POA: Diagnosis not present

## 2019-09-07 DIAGNOSIS — M62830 Muscle spasm of back: Secondary | ICD-10-CM | POA: Diagnosis not present

## 2019-09-07 DIAGNOSIS — M5136 Other intervertebral disc degeneration, lumbar region: Secondary | ICD-10-CM | POA: Diagnosis not present

## 2019-09-07 DIAGNOSIS — M5137 Other intervertebral disc degeneration, lumbosacral region: Secondary | ICD-10-CM | POA: Diagnosis not present

## 2019-09-07 DIAGNOSIS — M5442 Lumbago with sciatica, left side: Secondary | ICD-10-CM | POA: Diagnosis not present

## 2019-09-07 DIAGNOSIS — M9904 Segmental and somatic dysfunction of sacral region: Secondary | ICD-10-CM | POA: Diagnosis not present

## 2019-09-07 DIAGNOSIS — M9903 Segmental and somatic dysfunction of lumbar region: Secondary | ICD-10-CM | POA: Diagnosis not present

## 2019-10-02 DIAGNOSIS — M5136 Other intervertebral disc degeneration, lumbar region: Secondary | ICD-10-CM | POA: Diagnosis not present

## 2019-10-02 DIAGNOSIS — M5442 Lumbago with sciatica, left side: Secondary | ICD-10-CM | POA: Diagnosis not present

## 2019-10-02 DIAGNOSIS — M9904 Segmental and somatic dysfunction of sacral region: Secondary | ICD-10-CM | POA: Diagnosis not present

## 2019-10-02 DIAGNOSIS — M62830 Muscle spasm of back: Secondary | ICD-10-CM | POA: Diagnosis not present

## 2019-10-02 DIAGNOSIS — M9903 Segmental and somatic dysfunction of lumbar region: Secondary | ICD-10-CM | POA: Diagnosis not present

## 2019-10-02 DIAGNOSIS — M5137 Other intervertebral disc degeneration, lumbosacral region: Secondary | ICD-10-CM | POA: Diagnosis not present

## 2019-10-05 IMAGING — MR MR LUMBAR SPINE W/O CM
4 of 5 series · 20 of 48 positions shown · non-contrast
Comparison: Plain films lumbar spine 05/20/2015.

CLINICAL DATA: Low back pain radiating into the left buttock. Left
leg numbness. Symptoms are chronic. No known injury.

EXAM:
MRI LUMBAR SPINE WITHOUT CONTRAST
TECHNIQUE: Multiplanar, multisequence MR imaging of the lumbar spine was
performed. No intravenous contrast was administered.

[Series 2: T2 · sagittal · 4.0mm · 0.73mm/px · 4 of 16 slices shown (1 of 2)]
[im 1/16]
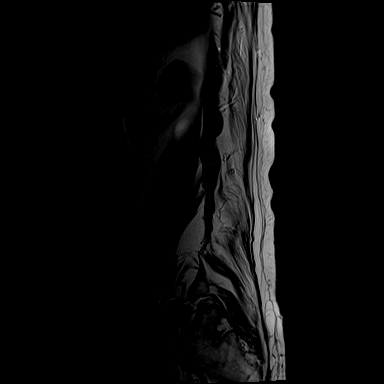
[im 6/16]
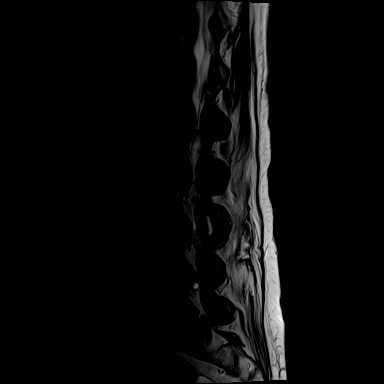
[im 11/16]
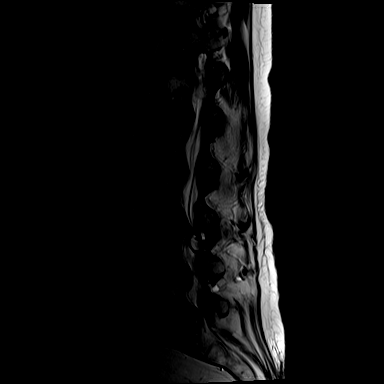
[im 16/16]
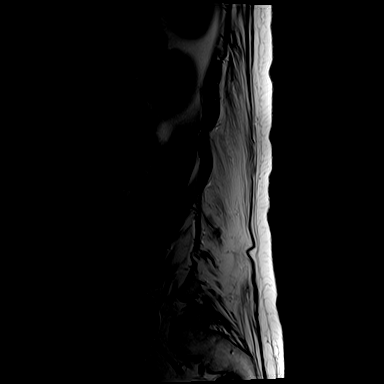

[Series 4: T1 · sagittal · 4.0mm · 0.73mm/px · 3 of 16 slices shown (1 of 2)]
[im 1/16]
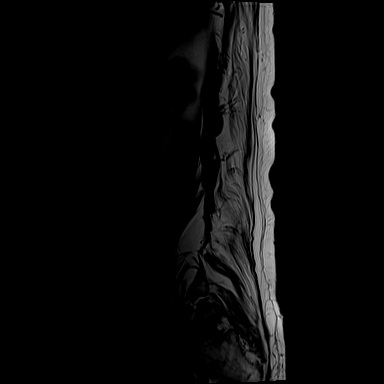
[im 8/16]
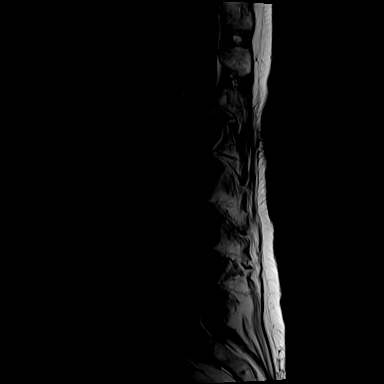
[im 16/16]
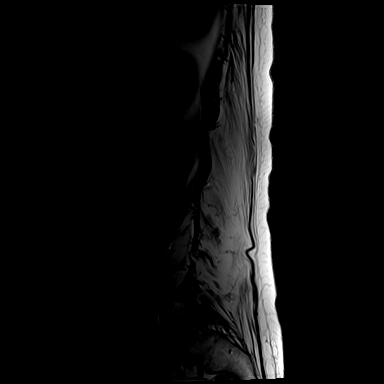

[Series 5: T2 · axial · 4.0mm · 0.39mm/px · z∈[-83,+134]mm · 10 of 54 slices shown (2 of 2)]
[im 4/54]
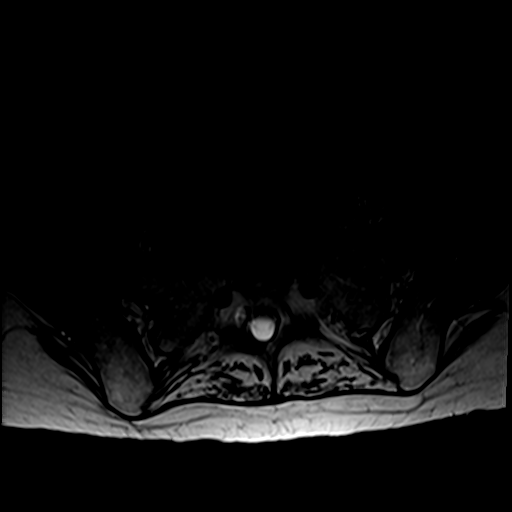
[im 7/54]
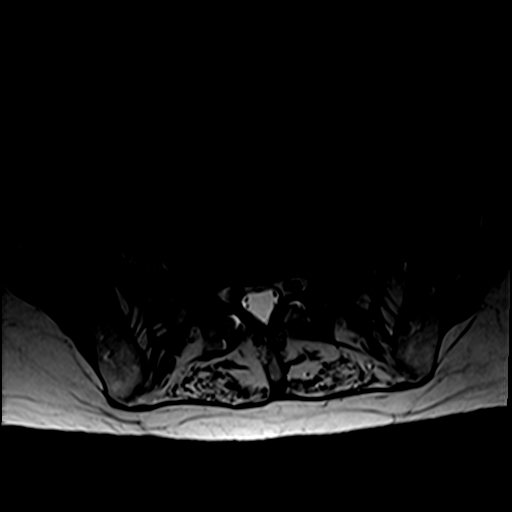
[im 10/54]
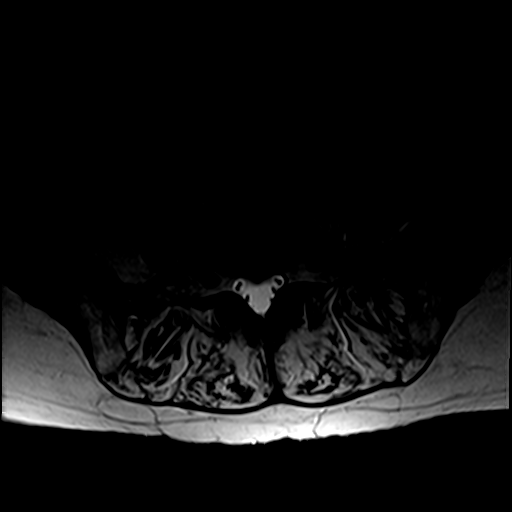
[im 17/54]
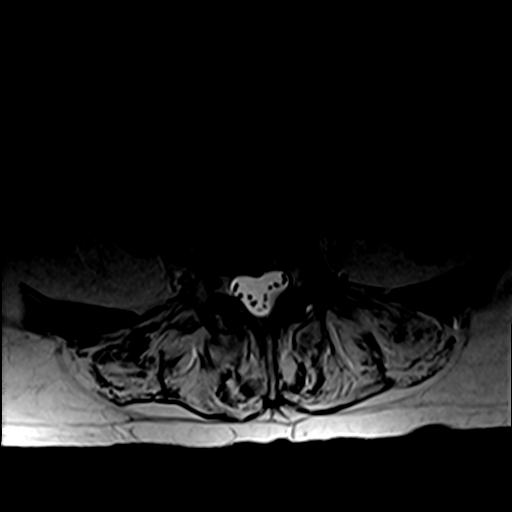
[im 24/54]
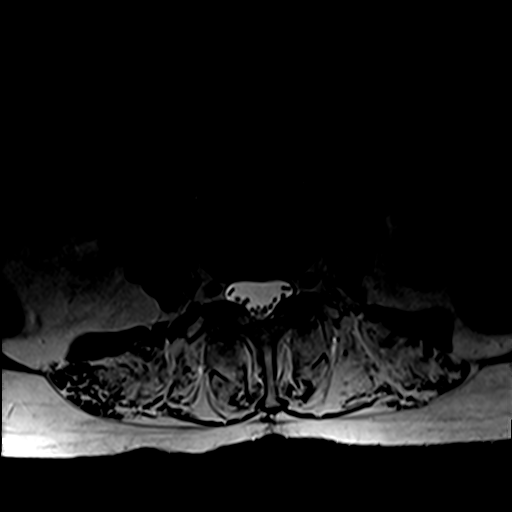
[im 27/54]
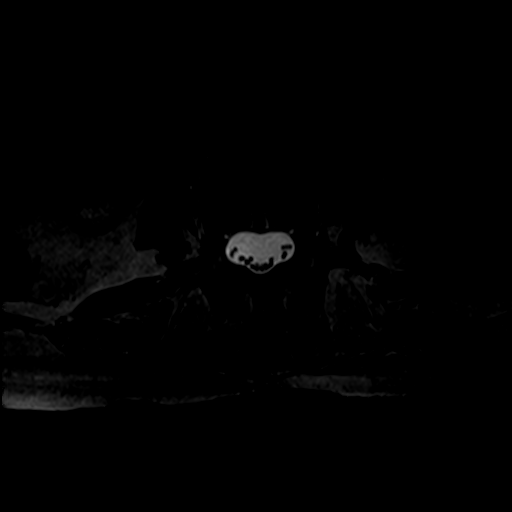
[im 30/54]
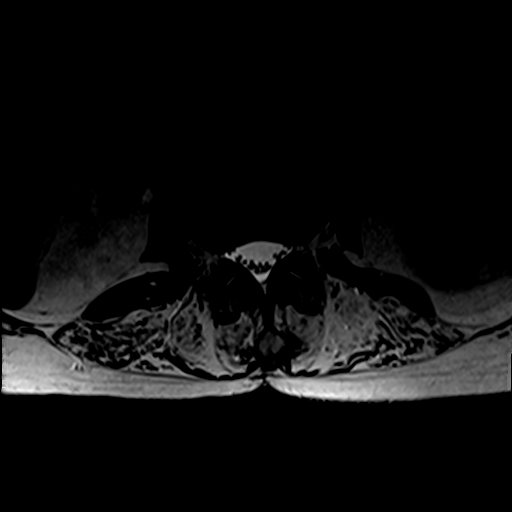
[im 37/54]
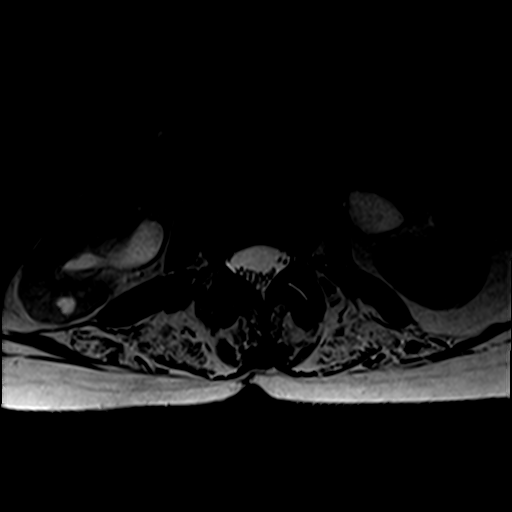
[im 44/54]
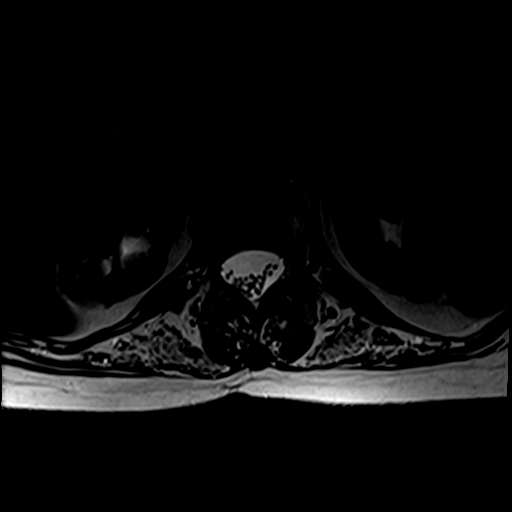
[im 47/54]
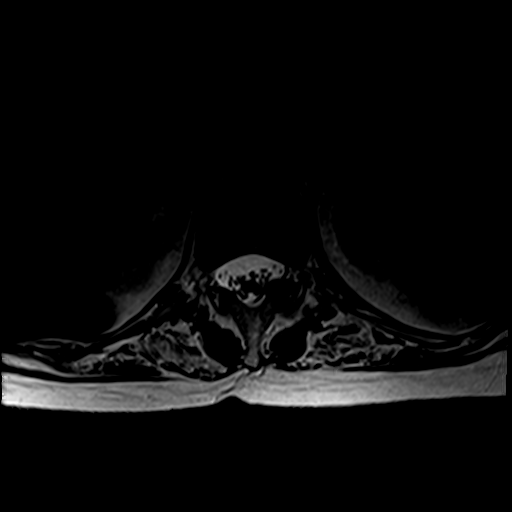

[Series 6: T1 · axial · 4.0mm · 0.31mm/px · z∈[-68,+134]mm · 3 of 54 slices shown (2 of 2)]
[im 7/54]
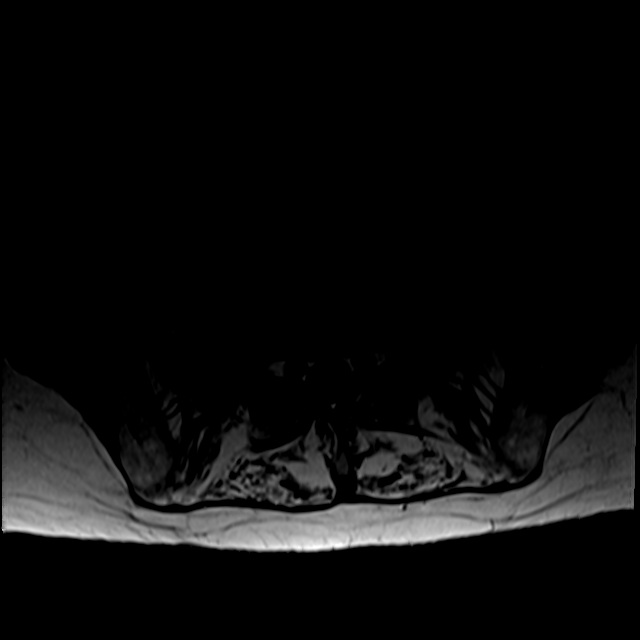
[im 27/54]
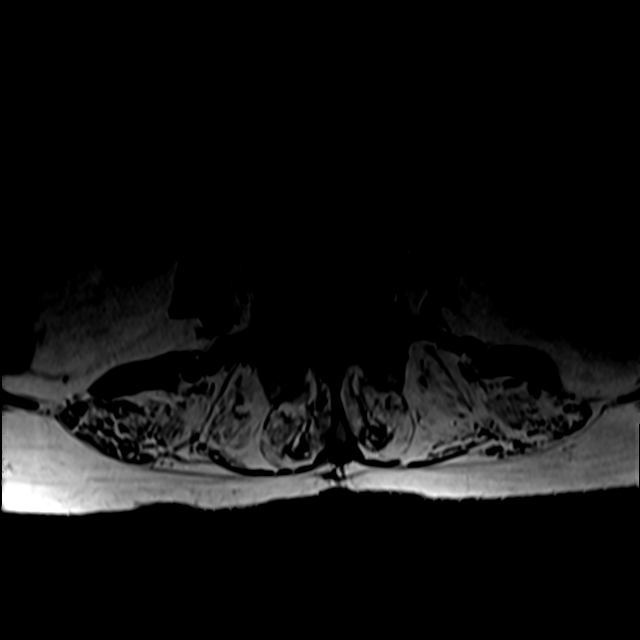
[im 47/54]
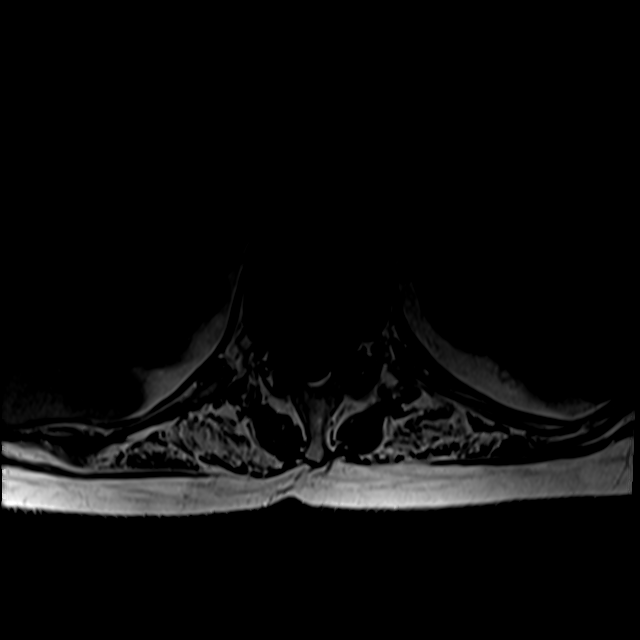

[20 of 48 positions shown; findings below may reference images not displayed]

FINDINGS: Segmentation:  Standard.

Alignment: Trace anterolisthesis L3 on L4 due to facet arthropathy
is noted. Alignment is otherwise maintained.

Vertebrae: No fracture or worrisome lesion. Small Schmorl's node in
the superior endplate of L3 is noted.

Conus medullaris and cauda equina: Conus extends to the T12-L1
level. Conus and cauda equina appear normal.

Paraspinal and other soft tissues: Small bilateral renal cysts are
seen.

Disc levels:

T11-12: Negative.

T12-L1: Facet degenerative disease on the left. No disc bulge or
protrusion. The central canal and foramina are open.

L1-2: Mild facet degenerative change. Small left paracentral
protrusion. The central canal and foramina are open.

L2-3: Mild facet degenerative change and a shallow disc bulge
without stenosis.

L3-4: Moderate to advanced facet arthropathy is worse on the left.
The disc is uncovered with a broad-based bulge. There is a synovial
cyst off the anterior margin of the left facet extending caudally in
the left subarticular recess. The cyst measures 1.3 cm transverse by
0.6 cm AP by 1.4 cm craniocaudal and impinges on the descending left
L4 root. The cyst also deforms the posterior aspect of the thecal
sac. There is moderate central canal stenosis overall at L3-4 and
narrowing in the right subarticular recess. The foramina are open.

L4-5: The patient has a broad-based central and left-sided disc
protrusion with caudal extension in the left subarticular recess.
The disc protrusion impinges on the descending right L5 and S1
roots.

L5-S1: Shallow broad-based disc bulge and mild-to-moderate facet
degenerative change. There is mild central canal stenosis overall.
Also seen is some narrowing in the subarticular recesses which could
irritate the descending S1 roots.
IMPRESSION: Large synovial cyst off the anterior margin of the left facet with
caudal extension at L3-4 disc in the left subarticular recess and
impinges on the descending left L4 root. There is moderate central
canal stenosis overall at L3-4 and narrowing in the right
subarticular recess.

Broad-based central and right side disc protrusion at L4-5 with
caudal extending disc in the right subarticular recess. The disc
causes impingement on the descending right L4 and L5 roots.

Narrowing in the subarticular recesses at L5-S1 due to shallow disc
bulge and facet arthropathy.

## 2019-10-30 DIAGNOSIS — M62830 Muscle spasm of back: Secondary | ICD-10-CM | POA: Diagnosis not present

## 2019-10-30 DIAGNOSIS — M9903 Segmental and somatic dysfunction of lumbar region: Secondary | ICD-10-CM | POA: Diagnosis not present

## 2019-10-30 DIAGNOSIS — M9904 Segmental and somatic dysfunction of sacral region: Secondary | ICD-10-CM | POA: Diagnosis not present

## 2019-10-30 DIAGNOSIS — M5136 Other intervertebral disc degeneration, lumbar region: Secondary | ICD-10-CM | POA: Diagnosis not present

## 2019-10-30 DIAGNOSIS — M5137 Other intervertebral disc degeneration, lumbosacral region: Secondary | ICD-10-CM | POA: Diagnosis not present

## 2019-10-30 DIAGNOSIS — M5442 Lumbago with sciatica, left side: Secondary | ICD-10-CM | POA: Diagnosis not present

## 2019-11-17 DIAGNOSIS — Z79899 Other long term (current) drug therapy: Secondary | ICD-10-CM | POA: Diagnosis not present

## 2019-11-17 DIAGNOSIS — S52612A Displaced fracture of left ulna styloid process, initial encounter for closed fracture: Secondary | ICD-10-CM | POA: Diagnosis not present

## 2019-11-17 DIAGNOSIS — S52502A Unspecified fracture of the lower end of left radius, initial encounter for closed fracture: Secondary | ICD-10-CM | POA: Diagnosis not present

## 2019-11-17 DIAGNOSIS — S52572A Other intraarticular fracture of lower end of left radius, initial encounter for closed fracture: Secondary | ICD-10-CM | POA: Diagnosis not present

## 2019-11-17 DIAGNOSIS — Z7982 Long term (current) use of aspirin: Secondary | ICD-10-CM | POA: Diagnosis not present

## 2019-11-17 DIAGNOSIS — I10 Essential (primary) hypertension: Secondary | ICD-10-CM | POA: Diagnosis not present

## 2019-11-18 DIAGNOSIS — S52502A Unspecified fracture of the lower end of left radius, initial encounter for closed fracture: Secondary | ICD-10-CM | POA: Diagnosis not present

## 2019-11-18 DIAGNOSIS — S62002A Unspecified fracture of navicular [scaphoid] bone of left wrist, initial encounter for closed fracture: Secondary | ICD-10-CM | POA: Diagnosis not present

## 2019-11-21 DIAGNOSIS — S52502A Unspecified fracture of the lower end of left radius, initial encounter for closed fracture: Secondary | ICD-10-CM | POA: Diagnosis not present

## 2019-11-21 DIAGNOSIS — Z1152 Encounter for screening for COVID-19: Secondary | ICD-10-CM | POA: Diagnosis not present

## 2019-11-22 DIAGNOSIS — Z79899 Other long term (current) drug therapy: Secondary | ICD-10-CM | POA: Diagnosis not present

## 2019-11-22 DIAGNOSIS — Z7982 Long term (current) use of aspirin: Secondary | ICD-10-CM | POA: Diagnosis not present

## 2019-11-22 DIAGNOSIS — I10 Essential (primary) hypertension: Secondary | ICD-10-CM | POA: Diagnosis not present

## 2019-11-22 DIAGNOSIS — M1990 Unspecified osteoarthritis, unspecified site: Secondary | ICD-10-CM | POA: Diagnosis not present

## 2019-11-22 DIAGNOSIS — S52572A Other intraarticular fracture of lower end of left radius, initial encounter for closed fracture: Secondary | ICD-10-CM | POA: Diagnosis not present

## 2019-11-22 DIAGNOSIS — G8918 Other acute postprocedural pain: Secondary | ICD-10-CM | POA: Diagnosis not present

## 2019-11-22 DIAGNOSIS — Z86010 Personal history of colonic polyps: Secondary | ICD-10-CM | POA: Diagnosis not present

## 2019-11-22 DIAGNOSIS — K219 Gastro-esophageal reflux disease without esophagitis: Secondary | ICD-10-CM | POA: Diagnosis not present

## 2019-11-22 DIAGNOSIS — Z886 Allergy status to analgesic agent status: Secondary | ICD-10-CM | POA: Diagnosis not present

## 2019-11-22 DIAGNOSIS — Z881 Allergy status to other antibiotic agents status: Secondary | ICD-10-CM | POA: Diagnosis not present

## 2019-12-01 DIAGNOSIS — H1812 Bullous keratopathy, left eye: Secondary | ICD-10-CM | POA: Diagnosis not present

## 2019-12-01 DIAGNOSIS — H18519 Endothelial corneal dystrophy, unspecified eye: Secondary | ICD-10-CM | POA: Diagnosis not present

## 2019-12-01 DIAGNOSIS — Z947 Corneal transplant status: Secondary | ICD-10-CM | POA: Diagnosis not present

## 2019-12-05 DIAGNOSIS — S52509A Unspecified fracture of the lower end of unspecified radius, initial encounter for closed fracture: Secondary | ICD-10-CM | POA: Diagnosis not present

## 2019-12-05 DIAGNOSIS — S52502A Unspecified fracture of the lower end of left radius, initial encounter for closed fracture: Secondary | ICD-10-CM | POA: Diagnosis not present

## 2019-12-09 DIAGNOSIS — Z23 Encounter for immunization: Secondary | ICD-10-CM | POA: Diagnosis not present

## 2020-01-04 DIAGNOSIS — M5136 Other intervertebral disc degeneration, lumbar region: Secondary | ICD-10-CM | POA: Diagnosis not present

## 2020-01-04 DIAGNOSIS — M5137 Other intervertebral disc degeneration, lumbosacral region: Secondary | ICD-10-CM | POA: Diagnosis not present

## 2020-01-04 DIAGNOSIS — M9903 Segmental and somatic dysfunction of lumbar region: Secondary | ICD-10-CM | POA: Diagnosis not present

## 2020-01-04 DIAGNOSIS — M9904 Segmental and somatic dysfunction of sacral region: Secondary | ICD-10-CM | POA: Diagnosis not present

## 2020-01-04 DIAGNOSIS — M62830 Muscle spasm of back: Secondary | ICD-10-CM | POA: Diagnosis not present

## 2020-01-04 DIAGNOSIS — M5442 Lumbago with sciatica, left side: Secondary | ICD-10-CM | POA: Diagnosis not present

## 2020-01-16 DIAGNOSIS — S52502A Unspecified fracture of the lower end of left radius, initial encounter for closed fracture: Secondary | ICD-10-CM | POA: Diagnosis not present

## 2020-01-18 DIAGNOSIS — Z79899 Other long term (current) drug therapy: Secondary | ICD-10-CM | POA: Diagnosis not present

## 2020-01-18 DIAGNOSIS — K219 Gastro-esophageal reflux disease without esophagitis: Secondary | ICD-10-CM | POA: Diagnosis not present

## 2020-01-18 DIAGNOSIS — F410 Panic disorder [episodic paroxysmal anxiety] without agoraphobia: Secondary | ICD-10-CM | POA: Diagnosis not present

## 2020-01-18 DIAGNOSIS — E7800 Pure hypercholesterolemia, unspecified: Secondary | ICD-10-CM | POA: Diagnosis not present

## 2020-01-18 DIAGNOSIS — Z8781 Personal history of (healed) traumatic fracture: Secondary | ICD-10-CM | POA: Diagnosis not present

## 2020-01-18 DIAGNOSIS — Z86010 Personal history of colonic polyps: Secondary | ICD-10-CM | POA: Diagnosis not present

## 2020-01-18 DIAGNOSIS — J309 Allergic rhinitis, unspecified: Secondary | ICD-10-CM | POA: Diagnosis not present

## 2020-01-18 DIAGNOSIS — Z0000 Encounter for general adult medical examination without abnormal findings: Secondary | ICD-10-CM | POA: Diagnosis not present

## 2020-01-18 DIAGNOSIS — I10 Essential (primary) hypertension: Secondary | ICD-10-CM | POA: Diagnosis not present

## 2020-02-15 DIAGNOSIS — M5136 Other intervertebral disc degeneration, lumbar region: Secondary | ICD-10-CM | POA: Diagnosis not present

## 2020-02-15 DIAGNOSIS — M62830 Muscle spasm of back: Secondary | ICD-10-CM | POA: Diagnosis not present

## 2020-02-15 DIAGNOSIS — M9903 Segmental and somatic dysfunction of lumbar region: Secondary | ICD-10-CM | POA: Diagnosis not present

## 2020-02-15 DIAGNOSIS — M5137 Other intervertebral disc degeneration, lumbosacral region: Secondary | ICD-10-CM | POA: Diagnosis not present

## 2020-02-15 DIAGNOSIS — M9904 Segmental and somatic dysfunction of sacral region: Secondary | ICD-10-CM | POA: Diagnosis not present

## 2020-02-15 DIAGNOSIS — M5442 Lumbago with sciatica, left side: Secondary | ICD-10-CM | POA: Diagnosis not present

## 2020-02-27 DIAGNOSIS — H1812 Bullous keratopathy, left eye: Secondary | ICD-10-CM | POA: Diagnosis not present

## 2020-02-27 DIAGNOSIS — H18513 Endothelial corneal dystrophy, bilateral: Secondary | ICD-10-CM | POA: Diagnosis not present

## 2020-02-27 DIAGNOSIS — Z947 Corneal transplant status: Secondary | ICD-10-CM | POA: Diagnosis not present

## 2020-02-29 DIAGNOSIS — Z1231 Encounter for screening mammogram for malignant neoplasm of breast: Secondary | ICD-10-CM | POA: Diagnosis not present

## 2020-03-21 DIAGNOSIS — M9904 Segmental and somatic dysfunction of sacral region: Secondary | ICD-10-CM | POA: Diagnosis not present

## 2020-03-21 DIAGNOSIS — M62830 Muscle spasm of back: Secondary | ICD-10-CM | POA: Diagnosis not present

## 2020-03-21 DIAGNOSIS — M5137 Other intervertebral disc degeneration, lumbosacral region: Secondary | ICD-10-CM | POA: Diagnosis not present

## 2020-03-21 DIAGNOSIS — M5136 Other intervertebral disc degeneration, lumbar region: Secondary | ICD-10-CM | POA: Diagnosis not present

## 2020-03-21 DIAGNOSIS — M9903 Segmental and somatic dysfunction of lumbar region: Secondary | ICD-10-CM | POA: Diagnosis not present

## 2020-03-21 DIAGNOSIS — M5442 Lumbago with sciatica, left side: Secondary | ICD-10-CM | POA: Diagnosis not present

## 2020-05-29 DIAGNOSIS — Z86010 Personal history of colonic polyps: Secondary | ICD-10-CM | POA: Diagnosis not present

## 2020-05-29 DIAGNOSIS — K5730 Diverticulosis of large intestine without perforation or abscess without bleeding: Secondary | ICD-10-CM | POA: Diagnosis not present

## 2020-05-29 DIAGNOSIS — Z1211 Encounter for screening for malignant neoplasm of colon: Secondary | ICD-10-CM | POA: Diagnosis not present

## 2020-06-11 DIAGNOSIS — M48062 Spinal stenosis, lumbar region with neurogenic claudication: Secondary | ICD-10-CM | POA: Diagnosis not present

## 2020-06-11 DIAGNOSIS — M4316 Spondylolisthesis, lumbar region: Secondary | ICD-10-CM | POA: Diagnosis not present

## 2020-06-11 DIAGNOSIS — M7138 Other bursal cyst, other site: Secondary | ICD-10-CM | POA: Diagnosis not present

## 2020-06-17 DIAGNOSIS — M545 Low back pain, unspecified: Secondary | ICD-10-CM | POA: Diagnosis not present

## 2020-06-17 DIAGNOSIS — M4316 Spondylolisthesis, lumbar region: Secondary | ICD-10-CM | POA: Diagnosis not present

## 2020-06-20 DIAGNOSIS — Z6824 Body mass index (BMI) 24.0-24.9, adult: Secondary | ICD-10-CM | POA: Diagnosis not present

## 2020-06-20 DIAGNOSIS — J309 Allergic rhinitis, unspecified: Secondary | ICD-10-CM | POA: Diagnosis not present

## 2020-07-09 DIAGNOSIS — M4316 Spondylolisthesis, lumbar region: Secondary | ICD-10-CM | POA: Diagnosis not present

## 2020-07-09 DIAGNOSIS — M48062 Spinal stenosis, lumbar region with neurogenic claudication: Secondary | ICD-10-CM | POA: Diagnosis not present

## 2020-07-09 DIAGNOSIS — M5416 Radiculopathy, lumbar region: Secondary | ICD-10-CM | POA: Diagnosis not present

## 2020-07-09 DIAGNOSIS — R030 Elevated blood-pressure reading, without diagnosis of hypertension: Secondary | ICD-10-CM | POA: Diagnosis not present

## 2020-09-30 DIAGNOSIS — L820 Inflamed seborrheic keratosis: Secondary | ICD-10-CM | POA: Diagnosis not present

## 2020-09-30 DIAGNOSIS — D1801 Hemangioma of skin and subcutaneous tissue: Secondary | ICD-10-CM | POA: Diagnosis not present

## 2020-09-30 DIAGNOSIS — L821 Other seborrheic keratosis: Secondary | ICD-10-CM | POA: Diagnosis not present

## 2020-09-30 DIAGNOSIS — L578 Other skin changes due to chronic exposure to nonionizing radiation: Secondary | ICD-10-CM | POA: Diagnosis not present

## 2020-10-11 DIAGNOSIS — M4316 Spondylolisthesis, lumbar region: Secondary | ICD-10-CM | POA: Diagnosis not present

## 2020-10-11 DIAGNOSIS — M48062 Spinal stenosis, lumbar region with neurogenic claudication: Secondary | ICD-10-CM | POA: Diagnosis not present

## 2020-10-14 ENCOUNTER — Other Ambulatory Visit: Payer: Self-pay | Admitting: Neurosurgery

## 2020-10-24 DIAGNOSIS — H52223 Regular astigmatism, bilateral: Secondary | ICD-10-CM | POA: Diagnosis not present

## 2020-10-24 DIAGNOSIS — H524 Presbyopia: Secondary | ICD-10-CM | POA: Diagnosis not present

## 2020-10-24 DIAGNOSIS — H5212 Myopia, left eye: Secondary | ICD-10-CM | POA: Diagnosis not present

## 2020-10-24 DIAGNOSIS — H18513 Endothelial corneal dystrophy, bilateral: Secondary | ICD-10-CM | POA: Diagnosis not present

## 2020-11-07 ENCOUNTER — Other Ambulatory Visit: Payer: Self-pay | Admitting: Neurosurgery

## 2020-11-13 NOTE — Progress Notes (Signed)
Surgical Instructions    Your procedure is scheduled on 11/25/20.  Report to [facility] Main Entrance "A" at 5:30 A.M., then check in with the Admitting office.  Call this number if you have problems the morning of surgery:  [phone]   If you have any questions prior to your surgery date call [phone]: Open Monday-Friday 8am-4pm    Remember:  Do not eat after midnight the night before your surgery  You may drink clear liquids until 4:30 am  the morning of your surgery.   Clear liquids allowed are: Water, Non-Citrus Juices (without pulp), Carbonated Beverages, Clear Tea, Black Coffee ONLY (NO MILK, CREAM OR POWDERED CREAMER of any kind), and Gatorade    Take these medicines the morning of surgery with A SIP OF WATER:  prednisoLONE acetate (PRED FORTE)  If Needed: acetaminophen (TYLENOL) ALPRAZolam (XANAX) famotidine (PEPCID)    As of today, STOP taking any Aspirin (unless otherwise instructed by your surgeon) Aleve, Naproxen, Ibuprofen, Motrin, Advil, Goody's, BC's, all herbal medications, fish oil, and all vitamins.          Do not wear jewelry or makeup Do not wear lotions, powders, perfumes or deodorant. Do not shave 48 hours prior to surgery.   Do not bring valuables to the hospital. DO Not wear nail polish, gel polish, artificial nails, or any other type of covering on  natural nails including finger and toenails. If patients have artificial nails, gel coating, etc. that need to be removed by a nail salon please have this removed prior to surgery or surgery may need to be canceled/delayed if the surgeon/ anesthesia feels like the patient is unable to be adequately monitored.             [facility] is not responsible for any belongings or valuables.  Do NOT Smoke (Tobacco/Vaping) or drink Alcohol 24 hours prior to your procedure If you use a CPAP at night, you may bring all equipment for your overnight stay.   Contacts, glasses, dentures or bridgework may not be  worn into surgery, please bring cases for these belongings   For patients admitted to the hospital, discharge time will be determined by your treatment team.   Patients discharged the day of surgery will not be allowed to drive home, and someone needs to stay with them for 24 hours.  ONLY 1 SUPPORT PERSON MAY BE PRESENT WHILE YOU ARE IN SURGERY. IF YOU ARE TO BE ADMITTED ONCE YOU ARE IN YOUR ROOM YOU WILL BE ALLOWED TWO (2) VISITORS.  Minor children may have two parents present. Special consideration for safety and communication needs will be reviewed on a case by case basis.  Special instructions:    Oral Hygiene is also important to reduce your risk of infection.  Remember - BRUSH YOUR TEETH THE MORNING OF SURGERY WITH YOUR REGULAR TOOTHPASTE   [place]- Preparing For Surgery  Before surgery, you can play an important role. Because skin is not sterile, your skin needs to be as free of germs as possible. You can reduce the number of germs on your skin by washing with CHG (chlorahexidine gluconate) Soap before surgery.  CHG is an antiseptic cleaner which kills germs and bonds with the skin to continue killing germs even after washing.     Please do not use if you have an allergy to CHG or antibacterial soaps. If your skin becomes reddened/irritated stop using the CHG.  Do not shave (including legs and underarms) for at least 48 hours  prior to first CHG shower. It is OK to shave your face.  Please follow these instructions carefully.     Shower the NIGHT BEFORE SURGERY and the MORNING OF SURGERY with CHG Soap.   If you chose to wash your hair, wash your hair first as usual with your normal shampoo. After you shampoo, rinse your hair and body thoroughly to remove the shampoo.  Then [employer] and genitals (private parts) with your normal soap and rinse thoroughly to remove soap.  After that Use CHG Soap as you would any other liquid soap. You can apply CHG directly to the skin and wash  gently with a scrungie or a clean washcloth.   Apply the CHG Soap to your body ONLY FROM THE NECK DOWN.  Do not use on open wounds or open sores. Avoid contact with your eyes, ears, mouth and genitals (private parts). Wash Face and genitals (private parts)  with your normal soap.   Wash thoroughly, paying special attention to the area where your surgery will be performed.  Thoroughly rinse your body with warm water from the neck down.  DO NOT shower/wash with your normal soap after using and rinsing off the CHG Soap.  Pat yourself dry with a CLEAN TOWEL.  Wear CLEAN PAJAMAS to bed the night before surgery  Place CLEAN SHEETS on your bed the night before your surgery  DO NOT SLEEP WITH PETS.   Day of Surgery:  Take a shower with CHG soap. Wear Clean/Comfortable clothing the morning of surgery Do not apply any deodorants/lotions.   Remember to brush your teeth WITH YOUR REGULAR TOOTHPASTE.   Please read over the following fact sheets that you were given.

## 2020-11-14 ENCOUNTER — Encounter (HOSPITAL_COMMUNITY)
Admission: RE | Admit: 2020-11-14 | Discharge: 2020-11-14 | Disposition: A | Discharge status: Home / Self Care | Payer: Medicare HMO | Source: Ambulatory Visit | Attending: Neurosurgery | Admitting: Neurosurgery

## 2020-11-14 ENCOUNTER — Encounter (HOSPITAL_COMMUNITY): Payer: Self-pay

## 2020-11-14 ENCOUNTER — Other Ambulatory Visit: Payer: Self-pay

## 2020-11-14 DIAGNOSIS — Z01818 Encounter for other preprocedural examination: Secondary | ICD-10-CM | POA: Insufficient documentation

## 2020-11-14 HISTORY — DX: Unspecified osteoarthritis, unspecified site: M19.90

## 2020-11-14 HISTORY — DX: Nausea with vomiting, unspecified: R11.2

## 2020-11-14 HISTORY — DX: Gastro-esophageal reflux disease without esophagitis: K21.9

## 2020-11-14 HISTORY — DX: Anxiety disorder, unspecified: F41.9

## 2020-11-14 HISTORY — DX: Other specified postprocedural states: Z98.890

## 2020-11-14 LAB — BASIC METABOLIC PANEL
Anion gap: 10 (ref 5–15)
BUN: 17 mg/dL (ref 8–23)
CO2: 25 mmol/L (ref 22–32)
Calcium: 9.8 mg/dL (ref 8.9–10.3)
Chloride: 105 mmol/L (ref 98–111)
Creatinine, Ser: 0.98 mg/dL (ref 0.44–1.00)
GFR, Estimated: >60 mL/min (ref >60)
Glucose, Bld: 84 mg/dL (ref 70–99)
Potassium: 4.3 mmol/L (ref 3.5–5.1)
Sodium: 140 mmol/L (ref 135–145)

## 2020-11-14 NOTE — Progress Notes (Signed)
Pt was a difficult blood draw and only able to obtain sample for BMP. Pt is coming for Covid test on 9/8. CBC and T&S will be drawn at that time.

## 2020-11-14 NOTE — Progress Notes (Addendum)
PCP - Mateo Flow MD Cardiologist - none  PPM/ICD - denies Device Orders -  Rep Notified -   Chest x-ray - n/a EKG - 11/14/20 Stress Test - none ECHO - none Cardiac Cath - none  Sleep Study - no CPAP -   Fasting Blood Sugar - n/a Checks Blood Sugar _____ times a day  Blood Thinner Instructions:n/a Aspirin Instructions:Pt states she has instructions from Dr. Arnoldo Morale on when to stop Aspirin.   ERAS Protcol -clears until 0430 PRE-SURGERY Ensure or G2-   COVID TEST- scheduled for 8/8 at 1000 am    Anesthesia review: no.  Patient denies shortness of breath, fever, cough and chest pain at PAT appointment   All instructions explained to the patient, with a verbal understanding of the material. Patient agrees to go over the instructions while at home for a better understanding. Patient also instructed to self quarantine after being tested for COVID-19. The opportunity to ask questions was provided.

## 2020-11-15 LAB — SURGICAL PCR SCREEN
MRSA, PCR: POSITIVE — AB
Staphylococcus aureus: POSITIVE — AB

## 2020-11-15 NOTE — Progress Notes (Signed)
Spoke with Nickie at Dr. Cam Hai office. 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

## 2020-11-21 ENCOUNTER — Other Ambulatory Visit (HOSPITAL_COMMUNITY)
Admission: RE | Admit: 2020-11-21 | Discharge: 2020-11-21 | Disposition: A | Discharge status: Home / Self Care | Payer: Medicare HMO | Source: Ambulatory Visit | Attending: Neurosurgery | Admitting: Neurosurgery

## 2020-11-21 DIAGNOSIS — Z20822 Contact with and (suspected) exposure to covid-19: Secondary | ICD-10-CM | POA: Diagnosis not present

## 2020-11-21 DIAGNOSIS — Z01812 Encounter for preprocedural laboratory examination: Secondary | ICD-10-CM | POA: Diagnosis not present

## 2020-11-21 LAB — CBC
HCT: 42.2 % (ref 36.0–46.0)
Hemoglobin: 13.9 g/dL (ref 12.0–15.0)
MCH: 28.8 pg (ref 26.0–34.0)
MCHC: 32.9 g/dL (ref 30.0–36.0)
MCV: 87.4 fL (ref 80.0–100.0)
Platelets: 207 10*3/uL (ref 150–400)
RBC: 4.83 MIL/uL (ref 3.87–5.11)
RDW: 13.5 % (ref 11.5–15.5)
WBC: 7.2 10*3/uL (ref 4.0–10.5)
nRBC: 0 % (ref 0.0–0.2)

## 2020-11-21 LAB — TYPE AND SCREEN
ABO/RH(D): A NEG
Antibody Screen: NEGATIVE

## 2020-11-21 LAB — SARS CORONAVIRUS 2 (TAT 6-24 HRS): SARS Coronavirus 2: NEGATIVE

## 2020-11-24 NOTE — Anesthesia Preprocedure Evaluation (Addendum)
Anesthesia Evaluation  Patient identified by MRN, date of birth, ID band Patient awake    Reviewed: Allergy & Precautions, NPO status , Patient's Chart, lab work & pertinent test results  History of Anesthesia Complications (+) PONV and history of anesthetic complications  Airway Mallampati: II  TM Distance: >3 FB Neck ROM: Full    Dental no notable dental hx.    Pulmonary neg pulmonary ROS,    Pulmonary exam normal breath sounds clear to auscultation       Cardiovascular Exercise Tolerance: Good negative cardio ROS Normal cardiovascular exam Rhythm:Regular Rate:Normal  Sinus bradycardia Septal infarct , age undetermined Abnormal ECG No previous tracing Confirmed by Shelva Majestic [id number]) on 11/14/2020 6:14:18 PM   Neuro/Psych Anxiety negative neurological ROS     GI/Hepatic Neg liver ROS, GERD  ,  Endo/Other  negative endocrine ROS  Renal/GU negative Renal ROS  negative genitourinary   Musculoskeletal  (+) Arthritis ,   Abdominal   Peds negative pediatric ROS (+)  Hematology negative hematology ROS (+)   Anesthesia Other Findings   Reproductive/Obstetrics negative OB ROS                           Anesthesia Physical Anesthesia Plan  ASA: 2  Anesthesia Plan: General   Post-op Pain Management:    Induction: Intravenous  PONV Risk Score and Plan: Treatment may vary due to age or medical condition, Midazolam, Ondansetron, Dexamethasone and Propofol infusion  Airway Management Planned: Oral ETT  Additional Equipment:   Intra-op Plan:   Post-operative Plan: Extubation in OR  Informed Consent: I have reviewed the patients History and Physical, chart, labs and discussed the procedure including the risks, benefits and alternatives for the proposed anesthesia with the patient or authorized representative who has indicated his/her understanding and acceptance.     Dental advisory  given  Plan Discussed with: CRNA, Anesthesiologist and Surgeon  Anesthesia Plan Comments:        Anesthesia Quick Evaluation

## 2020-11-25 ENCOUNTER — Encounter (HOSPITAL_COMMUNITY): Payer: Self-pay | Admitting: Neurosurgery

## 2020-11-25 ENCOUNTER — Inpatient Hospital Stay (HOSPITAL_COMMUNITY): Payer: Medicare HMO

## 2020-11-25 ENCOUNTER — Other Ambulatory Visit: Payer: Self-pay

## 2020-11-25 ENCOUNTER — Inpatient Hospital Stay (HOSPITAL_COMMUNITY)
Admission: RE | Disposition: A | Discharge status: Home / Self Care | Payer: Self-pay | Source: Home / Self Care | Attending: Neurosurgery

## 2020-11-25 ENCOUNTER — Inpatient Hospital Stay (HOSPITAL_COMMUNITY): Payer: Medicare HMO | Admitting: Certified Registered Nurse Anesthetist

## 2020-11-25 ENCOUNTER — Inpatient Hospital Stay (HOSPITAL_COMMUNITY)
Admission: RE | Admit: 2020-11-25 | Discharge: 2020-11-27 | DRG: 455 | Disposition: A | Discharge status: Home / Self Care | Payer: Medicare HMO | Attending: Neurosurgery | Admitting: Neurosurgery

## 2020-11-25 DIAGNOSIS — M48062 Spinal stenosis, lumbar region with neurogenic claudication: Secondary | ICD-10-CM | POA: Diagnosis not present

## 2020-11-25 DIAGNOSIS — M4326 Fusion of spine, lumbar region: Secondary | ICD-10-CM | POA: Diagnosis not present

## 2020-11-25 DIAGNOSIS — Z20822 Contact with and (suspected) exposure to covid-19: Secondary | ICD-10-CM | POA: Diagnosis present

## 2020-11-25 DIAGNOSIS — F419 Anxiety disorder, unspecified: Secondary | ICD-10-CM | POA: Diagnosis not present

## 2020-11-25 DIAGNOSIS — K219 Gastro-esophageal reflux disease without esophagitis: Secondary | ICD-10-CM | POA: Diagnosis not present

## 2020-11-25 DIAGNOSIS — Z7982 Long term (current) use of aspirin: Secondary | ICD-10-CM

## 2020-11-25 DIAGNOSIS — M4316 Spondylolisthesis, lumbar region: Secondary | ICD-10-CM | POA: Diagnosis not present

## 2020-11-25 DIAGNOSIS — M4726 Other spondylosis with radiculopathy, lumbar region: Secondary | ICD-10-CM | POA: Diagnosis not present

## 2020-11-25 DIAGNOSIS — Z885 Allergy status to narcotic agent status: Secondary | ICD-10-CM | POA: Diagnosis not present

## 2020-11-25 DIAGNOSIS — Z882 Allergy status to sulfonamides status: Secondary | ICD-10-CM

## 2020-11-25 DIAGNOSIS — M1990 Unspecified osteoarthritis, unspecified site: Secondary | ICD-10-CM | POA: Diagnosis present

## 2020-11-25 DIAGNOSIS — Z79899 Other long term (current) drug therapy: Secondary | ICD-10-CM | POA: Diagnosis not present

## 2020-11-25 DIAGNOSIS — Z419 Encounter for procedure for purposes other than remedying health state, unspecified: Secondary | ICD-10-CM

## 2020-11-25 DIAGNOSIS — Z981 Arthrodesis status: Secondary | ICD-10-CM | POA: Diagnosis not present

## 2020-11-25 DIAGNOSIS — M5416 Radiculopathy, lumbar region: Secondary | ICD-10-CM | POA: Diagnosis not present

## 2020-11-25 HISTORY — DX: Spondylolisthesis, lumbar region: M43.16

## 2020-11-25 LAB — ABO/RH: ABO/RH(D): A NEG

## 2020-11-25 SURGERY — POSTERIOR LUMBAR FUSION 2 LEVEL
Anesthesia: General | Site: Spine Lumbar

## 2020-11-25 MED ORDER — DOCUSATE SODIUM 100 MG PO CAPS
100.0000 mg | ORAL_CAPSULE | Freq: Two times a day (BID) | ORAL | Status: DC
  Administered 2020-11-25 – 2020-11-27 (×5): 100 mg via ORAL
  Filled 2020-11-25 (×5): qty 1

## 2020-11-25 MED ORDER — ONDANSETRON HCL 4 MG/2ML IJ SOLN: 4.0000 mg | Freq: Four times a day (QID) | INTRAMUSCULAR | Status: DC | PRN

## 2020-11-25 MED ORDER — PROPOFOL 10 MG/ML IV BOLUS
INTRAVENOUS | Status: AC
  Filled 2020-11-25: qty 40

## 2020-11-25 MED ORDER — CEFAZOLIN SODIUM-DEXTROSE 1-4 GM/50ML-% IV SOLN: 1.0000 g | Freq: Four times a day (QID) | INTRAVENOUS | Status: DC

## 2020-11-25 MED ORDER — CHLORHEXIDINE GLUCONATE CLOTH 2 % EX PADS: 6.0000 | MEDICATED_PAD | Freq: Once | CUTANEOUS | Status: DC

## 2020-11-25 MED ORDER — ROCURONIUM BROMIDE 10 MG/ML (PF) SYRINGE
PREFILLED_SYRINGE | INTRAVENOUS | Status: AC
  Filled 2020-11-25: qty 10

## 2020-11-25 MED ORDER — FENTANYL CITRATE (PF) 250 MCG/5ML IJ SOLN
INTRAMUSCULAR | Status: DC | PRN
  Administered 2020-11-25 (×3): 50 ug via INTRAVENOUS

## 2020-11-25 MED ORDER — PHENYLEPHRINE HCL-NACL 20-0.9 MG/250ML-% IV SOLN
INTRAVENOUS | Status: DC | PRN
  Administered 2020-11-25: 25 ug/min via INTRAVENOUS

## 2020-11-25 MED ORDER — ALPRAZOLAM 0.25 MG PO TABS
0.2500 mg | ORAL_TABLET | Freq: Two times a day (BID) | ORAL | Status: DC | PRN
Start: 2020-11-25 — End: 2020-11-27
  Administered 2020-11-26: 0.25 mg via ORAL
  Filled 2020-11-25: qty 1

## 2020-11-25 MED ORDER — PHENYLEPHRINE 40 MCG/ML (10ML) SYRINGE FOR IV PUSH (FOR BLOOD PRESSURE SUPPORT)
PREFILLED_SYRINGE | INTRAVENOUS | Status: AC
  Filled 2020-11-25: qty 10

## 2020-11-25 MED ORDER — LIDOCAINE 2% (20 MG/ML) 5 ML SYRINGE
INTRAMUSCULAR | Status: AC
  Filled 2020-11-25: qty 5

## 2020-11-25 MED ORDER — FAMOTIDINE 20 MG PO TABS
20.0000 mg | ORAL_TABLET | Freq: Every day | ORAL | Status: DC | PRN
  Administered 2020-11-25 – 2020-11-26 (×2): 20 mg via ORAL
  Filled 2020-11-25 (×2): qty 1

## 2020-11-25 MED ORDER — PROPOFOL 500 MG/50ML IV EMUL
INTRAVENOUS | Status: DC | PRN
  Administered 2020-11-25: 20 ug/kg/min via INTRAVENOUS

## 2020-11-25 MED ORDER — EPHEDRINE 5 MG/ML INJ
INTRAVENOUS | Status: AC
  Filled 2020-11-25: qty 5

## 2020-11-25 MED ORDER — LACTATED RINGERS IV SOLN: INTRAVENOUS | Status: DC

## 2020-11-25 MED ORDER — SCOPOLAMINE 1 MG/3DAYS TD PT72
1.0000 | MEDICATED_PATCH | TRANSDERMAL | Status: DC
  Administered 2020-11-25: 1.5 mg via TRANSDERMAL
  Filled 2020-11-25: qty 1

## 2020-11-25 MED ORDER — ZOLPIDEM TARTRATE 5 MG PO TABS: 5.0000 mg | ORAL_TABLET | Freq: Every evening | ORAL | Status: DC | PRN

## 2020-11-25 MED ORDER — CYCLOBENZAPRINE HCL 10 MG PO TABS
10.0000 mg | ORAL_TABLET | Freq: Three times a day (TID) | ORAL | Status: DC | PRN
  Administered 2020-11-25 – 2020-11-26 (×3): 10 mg via ORAL
  Filled 2020-11-25 (×3): qty 1

## 2020-11-25 MED ORDER — ONDANSETRON HCL 4 MG/2ML IJ SOLN
INTRAMUSCULAR | Status: AC
  Filled 2020-11-25: qty 2

## 2020-11-25 MED ORDER — OXYCODONE HCL 5 MG PO TABS
10.0000 mg | ORAL_TABLET | ORAL | Status: DC | PRN
Start: 2020-11-25 — End: 2020-11-26
  Administered 2020-11-25 – 2020-11-26 (×4): 10 mg via ORAL
  Filled 2020-11-25 (×4): qty 2

## 2020-11-25 MED ORDER — PHENYLEPHRINE 40 MCG/ML (10ML) SYRINGE FOR IV PUSH (FOR BLOOD PRESSURE SUPPORT)
PREFILLED_SYRINGE | INTRAVENOUS | Status: DC | PRN
  Administered 2020-11-25: 80 ug via INTRAVENOUS

## 2020-11-25 MED ORDER — SUGAMMADEX SODIUM 200 MG/2ML IV SOLN
INTRAVENOUS | Status: DC | PRN
Start: 2020-11-25 — End: 2020-11-25
  Administered 2020-11-25: 200 mg via INTRAVENOUS

## 2020-11-25 MED ORDER — DEXAMETHASONE SODIUM PHOSPHATE 10 MG/ML IJ SOLN
INTRAMUSCULAR | Status: DC | PRN
  Administered 2020-11-25: 10 mg via INTRAVENOUS

## 2020-11-25 MED ORDER — BISOPROLOL-HYDROCHLOROTHIAZIDE 2.5-6.25 MG PO TABS
1.0000 | ORAL_TABLET | Freq: Every morning | ORAL | Status: DC
  Filled 2020-11-25: qty 1

## 2020-11-25 MED ORDER — 0.9 % SODIUM CHLORIDE (POUR BTL) OPTIME
TOPICAL | Status: DC | PRN
  Administered 2020-11-25 (×2): 1000 mL

## 2020-11-25 MED ORDER — FENTANYL CITRATE (PF) 100 MCG/2ML IJ SOLN
INTRAMUSCULAR | Status: AC
  Filled 2020-11-25: qty 2

## 2020-11-25 MED ORDER — ACETAMINOPHEN 500 MG PO TABS
1000.0000 mg | ORAL_TABLET | Freq: Four times a day (QID) | ORAL | Status: AC
  Administered 2020-11-25 – 2020-11-26 (×4): 1000 mg via ORAL
  Filled 2020-11-25 (×4): qty 2

## 2020-11-25 MED ORDER — VANCOMYCIN HCL 1000 MG IV SOLR
INTRAVENOUS | Status: DC | PRN
  Administered 2020-11-25: 1000 mg via INTRAVENOUS

## 2020-11-25 MED ORDER — LIDOCAINE 2% (20 MG/ML) 5 ML SYRINGE
INTRAMUSCULAR | Status: DC | PRN
  Administered 2020-11-25: 80 mg via INTRAVENOUS

## 2020-11-25 MED ORDER — THROMBIN 5000 UNITS EX SOLR
OROMUCOSAL | Status: DC | PRN
  Administered 2020-11-25 (×2): 5 mL via TOPICAL

## 2020-11-25 MED ORDER — OXYCODONE HCL 5 MG PO TABS
5.0000 mg | ORAL_TABLET | ORAL | Status: DC | PRN
Start: 2020-11-25 — End: 2020-11-27
  Administered 2020-11-25 – 2020-11-27 (×3): 5 mg via ORAL
  Filled 2020-11-25 (×3): qty 1

## 2020-11-25 MED ORDER — ACETAMINOPHEN 325 MG PO TABS: 650.0000 mg | ORAL_TABLET | ORAL | Status: DC | PRN

## 2020-11-25 MED ORDER — ACETAMINOPHEN 650 MG RE SUPP: 650.0000 mg | RECTAL | Status: DC | PRN

## 2020-11-25 MED ORDER — ROCURONIUM BROMIDE 10 MG/ML (PF) SYRINGE
PREFILLED_SYRINGE | INTRAVENOUS | Status: DC | PRN
Start: 2020-11-25 — End: 2020-11-25
  Administered 2020-11-25 (×3): 20 mg via INTRAVENOUS
  Administered 2020-11-25: 100 mg via INTRAVENOUS

## 2020-11-25 MED ORDER — CEFAZOLIN SODIUM-DEXTROSE 2-4 GM/100ML-% IV SOLN
2.0000 g | INTRAVENOUS | Status: AC
  Administered 2020-11-25: 2 g via INTRAVENOUS
  Filled 2020-11-25: qty 100

## 2020-11-25 MED ORDER — VANCOMYCIN HCL IN DEXTROSE 1-5 GM/200ML-% IV SOLN
1000.0000 mg | Freq: Once | INTRAVENOUS | Status: AC
  Administered 2020-11-25: 1000 mg via INTRAVENOUS
  Filled 2020-11-25: qty 200

## 2020-11-25 MED ORDER — MIDAZOLAM HCL 2 MG/2ML IJ SOLN
INTRAMUSCULAR | Status: AC
  Filled 2020-11-25: qty 2

## 2020-11-25 MED ORDER — DEXAMETHASONE SODIUM PHOSPHATE 10 MG/ML IJ SOLN
INTRAMUSCULAR | Status: AC
  Filled 2020-11-25: qty 1

## 2020-11-25 MED ORDER — HEMOSTATIC AGENTS (NO CHARGE) OPTIME
TOPICAL | Status: DC | PRN
  Administered 2020-11-25: 1 via TOPICAL

## 2020-11-25 MED ORDER — PROPOFOL 1000 MG/100ML IV EMUL
INTRAVENOUS | Status: AC
  Filled 2020-11-25: qty 100

## 2020-11-25 MED ORDER — MENTHOL 3 MG MT LOZG
1.0000 | LOZENGE | OROMUCOSAL | Status: DC | PRN
  Filled 2020-11-25: qty 9

## 2020-11-25 MED ORDER — PROPOFOL 10 MG/ML IV BOLUS
INTRAVENOUS | Status: DC | PRN
  Administered 2020-11-25: 10 mg via INTRAVENOUS
  Administered 2020-11-25: 150 mg via INTRAVENOUS

## 2020-11-25 MED ORDER — BUPIVACAINE-EPINEPHRINE 0.5% -1:200000 IJ SOLN
INTRAMUSCULAR | Status: AC
  Filled 2020-11-25: qty 1

## 2020-11-25 MED ORDER — FENTANYL CITRATE (PF) 100 MCG/2ML IJ SOLN
25.0000 ug | INTRAMUSCULAR | Status: DC | PRN
  Administered 2020-11-25 (×3): 50 ug via INTRAVENOUS

## 2020-11-25 MED ORDER — MORPHINE SULFATE (PF) 4 MG/ML IV SOLN: 4.0000 mg | INTRAVENOUS | Status: DC | PRN

## 2020-11-25 MED ORDER — GLYCOPYRROLATE PF 0.2 MG/ML IJ SOSY
PREFILLED_SYRINGE | INTRAMUSCULAR | Status: DC | PRN
  Administered 2020-11-25: .1 mg via INTRAVENOUS

## 2020-11-25 MED ORDER — BUPIVACAINE-EPINEPHRINE (PF) 0.5% -1:200000 IJ SOLN
INTRAMUSCULAR | Status: DC | PRN
  Administered 2020-11-25: 10 mL

## 2020-11-25 MED ORDER — SODIUM CHLORIDE 0.9% FLUSH
3.0000 mL | Freq: Two times a day (BID) | INTRAVENOUS | Status: DC
  Administered 2020-11-25 (×2): 3 mL via INTRAVENOUS

## 2020-11-25 MED ORDER — PROMETHAZINE HCL 25 MG/ML IJ SOLN: 6.2500 mg | INTRAMUSCULAR | Status: DC | PRN

## 2020-11-25 MED ORDER — BACITRACIN ZINC 500 UNIT/GM EX OINT
TOPICAL_OINTMENT | CUTANEOUS | Status: DC | PRN
  Administered 2020-11-25: 1 via TOPICAL

## 2020-11-25 MED ORDER — SODIUM CHLORIDE 0.9% FLUSH: 3.0000 mL | INTRAVENOUS | Status: DC | PRN

## 2020-11-25 MED ORDER — ALBUMIN HUMAN 5 % IV SOLN: INTRAVENOUS | Status: DC | PRN

## 2020-11-25 MED ORDER — ONDANSETRON HCL 4 MG/2ML IJ SOLN
INTRAMUSCULAR | Status: DC | PRN
  Administered 2020-11-25: 4 mg via INTRAVENOUS

## 2020-11-25 MED ORDER — CHLORHEXIDINE GLUCONATE 0.12 % MT SOLN
15.0000 mL | Freq: Once | OROMUCOSAL | Status: AC
  Administered 2020-11-25: 15 mL via OROMUCOSAL
  Filled 2020-11-25: qty 15

## 2020-11-25 MED ORDER — PHENOL 1.4 % MT LIQD: 1.0000 | OROMUCOSAL | Status: DC | PRN

## 2020-11-25 MED ORDER — BISACODYL 10 MG RE SUPP: 10.0000 mg | Freq: Every day | RECTAL | Status: DC | PRN

## 2020-11-25 MED ORDER — BUPIVACAINE LIPOSOME 1.3 % IJ SUSP
INTRAMUSCULAR | Status: AC
  Filled 2020-11-25: qty 20

## 2020-11-25 MED ORDER — VANCOMYCIN HCL IN DEXTROSE 1-5 GM/200ML-% IV SOLN
INTRAVENOUS | Status: AC
  Filled 2020-11-25: qty 200

## 2020-11-25 MED ORDER — ORAL CARE MOUTH RINSE: 15.0000 mL | Freq: Once | OROMUCOSAL | Status: AC

## 2020-11-25 MED ORDER — THROMBIN 5000 UNITS EX SOLR
CUTANEOUS | Status: AC
  Filled 2020-11-25: qty 5000

## 2020-11-25 MED ORDER — ONDANSETRON HCL 4 MG PO TABS: 4.0000 mg | ORAL_TABLET | Freq: Four times a day (QID) | ORAL | Status: DC | PRN

## 2020-11-25 MED ORDER — BUPIVACAINE LIPOSOME 1.3 % IJ SUSP
INTRAMUSCULAR | Status: DC | PRN
  Administered 2020-11-25: 20 mL

## 2020-11-25 MED ORDER — MIDAZOLAM HCL 2 MG/2ML IJ SOLN
INTRAMUSCULAR | Status: DC | PRN
Start: 2020-11-25 — End: 2020-11-25
  Administered 2020-11-25: 2 mg via INTRAVENOUS

## 2020-11-25 MED ORDER — BACITRACIN ZINC 500 UNIT/GM EX OINT
TOPICAL_OINTMENT | CUTANEOUS | Status: AC
  Filled 2020-11-25: qty 28.35

## 2020-11-25 MED ORDER — SODIUM CHLORIDE 0.9 % IV SOLN
250.0000 mL | INTRAVENOUS | Status: DC
  Administered 2020-11-25: 250 mL via INTRAVENOUS

## 2020-11-25 MED ORDER — ACETAMINOPHEN 500 MG PO TABS
1000.0000 mg | ORAL_TABLET | Freq: Once | ORAL | Status: AC
  Administered 2020-11-25: 1000 mg via ORAL
  Filled 2020-11-25: qty 2

## 2020-11-25 MED ORDER — DROPERIDOL 2.5 MG/ML IJ SOLN: 0.6250 mg | Freq: Once | INTRAMUSCULAR | Status: DC | PRN

## 2020-11-25 MED ORDER — BISOPROLOL FUMARATE 5 MG PO TABS
2.5000 mg | ORAL_TABLET | Freq: Every day | ORAL | Status: DC
  Administered 2020-11-26 – 2020-11-27 (×2): 2.5 mg via ORAL
  Filled 2020-11-25 (×2): qty 0.5

## 2020-11-25 MED ORDER — FENTANYL CITRATE (PF) 250 MCG/5ML IJ SOLN
INTRAMUSCULAR | Status: AC
  Filled 2020-11-25: qty 5

## 2020-11-25 SURGICAL SUPPLY — 67 items
BAG COUNTER SPONGE SURGICOUNT (BAG) ×2 IMPLANT
BASKET BONE COLLECTION (BASKET) ×2 IMPLANT
BENZOIN TINCTURE PRP APPL 2/3 (GAUZE/BANDAGES/DRESSINGS) ×2 IMPLANT
BLADE CLIPPER SURG (BLADE) IMPLANT
BUR MATCHSTICK NEURO 3.0 LAGG (BURR) ×2 IMPLANT
BUR PRECISION FLUTE 6.0 (BURR) ×2 IMPLANT
CANISTER SUCT 3000ML PPV (MISCELLANEOUS) ×2 IMPLANT
CAP LOCK DLX THRD (Cap) ×12 IMPLANT
CARTRIDGE OIL MAESTRO DRILL (MISCELLANEOUS) ×1 IMPLANT
CLSR STERI-STRIP ANTIMIC 1/2X4 (GAUZE/BANDAGES/DRESSINGS) ×2 IMPLANT
CNTNR URN SCR LID CUP LEK RST (MISCELLANEOUS) ×1 IMPLANT
CONT SPEC 4OZ STRL OR WHT (MISCELLANEOUS) ×1
COVER BACK TABLE 60X90IN (DRAPES) ×2 IMPLANT
DECANTER SPIKE VIAL GLASS SM (MISCELLANEOUS) ×2 IMPLANT
DIFFUSER DRILL AIR PNEUMATIC (MISCELLANEOUS) ×2 IMPLANT
DRAPE C-ARM 42X72 X-RAY (DRAPES) ×4 IMPLANT
DRAPE HALF SHEET 40X57 (DRAPES) ×2 IMPLANT
DRAPE LAPAROTOMY 100X72X124 (DRAPES) ×2 IMPLANT
DRAPE SURG 17X23 STRL (DRAPES) ×8 IMPLANT
DRSG OPSITE POSTOP 4X6 (GAUZE/BANDAGES/DRESSINGS) ×2 IMPLANT
DRSG OPSITE POSTOP 4X8 (GAUZE/BANDAGES/DRESSINGS) ×2 IMPLANT
ELECT BLADE 4.0 EZ CLEAN MEGAD (MISCELLANEOUS) ×2
ELECT REM PT RETURN 9FT ADLT (ELECTROSURGICAL) ×2
ELECTRODE BLDE 4.0 EZ CLN MEGD (MISCELLANEOUS) ×1 IMPLANT
ELECTRODE REM PT RTRN 9FT ADLT (ELECTROSURGICAL) ×1 IMPLANT
GAUZE 4X4 16PLY ~~LOC~~+RFID DBL (SPONGE) ×2 IMPLANT
GLOVE EXAM NITRILE XL STR (GLOVE) IMPLANT
GLOVE SURG ENC MOIS LTX SZ8 (GLOVE) ×4 IMPLANT
GLOVE SURG ENC MOIS LTX SZ8.5 (GLOVE) ×4 IMPLANT
GLOVE SURG UNDER POLY LF SZ6.5 (GLOVE) ×6 IMPLANT
GLOVE SURG UNDER POLY LF SZ7 (GLOVE) ×4 IMPLANT
GOWN STRL REUS W/ TWL LRG LVL3 (GOWN DISPOSABLE) ×3 IMPLANT
GOWN STRL REUS W/ TWL XL LVL3 (GOWN DISPOSABLE) ×2 IMPLANT
GOWN STRL REUS W/TWL 2XL LVL3 (GOWN DISPOSABLE) IMPLANT
GOWN STRL REUS W/TWL LRG LVL3 (GOWN DISPOSABLE) ×3
GOWN STRL REUS W/TWL XL LVL3 (GOWN DISPOSABLE) ×2
HEMOSTAT POWDER KIT SURGIFOAM (HEMOSTASIS) ×4 IMPLANT
KIT BASIN OR (CUSTOM PROCEDURE TRAY) ×2 IMPLANT
KIT TURNOVER KIT B (KITS) ×2 IMPLANT
MILL MEDIUM DISP (BLADE) ×2 IMPLANT
NEEDLE HYPO 21X1.5 SAFETY (NEEDLE) ×2 IMPLANT
NEEDLE HYPO 22GX1.5 SAFETY (NEEDLE) ×2 IMPLANT
NS IRRIG 1000ML POUR BTL (IV SOLUTION) ×4 IMPLANT
OIL CARTRIDGE MAESTRO DRILL (MISCELLANEOUS) ×2
PACK LAMINECTOMY NEURO (CUSTOM PROCEDURE TRAY) ×2 IMPLANT
PAD ARMBOARD 7.5X6 YLW CONV (MISCELLANEOUS) ×6 IMPLANT
PATTIES SURGICAL .5 X1 (DISPOSABLE) IMPLANT
PATTIES SURGICAL 1X1 (DISPOSABLE) ×2 IMPLANT
PUTTY DBM 10CC CALC GRAN (Putty) ×4 IMPLANT
ROD CURVED TI 6.35X80 (Rod) ×4 IMPLANT
SCREW PA CREO DLX 6.5X50 (Screw) ×2 IMPLANT
SCREW PA DLX CREO 7.5X45 (Screw) ×2 IMPLANT
SCREW PA DLX CREO 7.5X50 (Screw) ×8 IMPLANT
SPACER ALTERA 10X31 8-12MM-8 (Spacer) ×2 IMPLANT
SPACER ALTERA 10X31-15 (Spacer) ×2 IMPLANT
SPONGE NEURO XRAY DETECT 1X3 (DISPOSABLE) IMPLANT
SPONGE SURGIFOAM ABS GEL 100 (HEMOSTASIS) ×2 IMPLANT
SPONGE T-LAP 4X18 ~~LOC~~+RFID (SPONGE) ×6 IMPLANT
STRIP CLOSURE SKIN 1/2X4 (GAUZE/BANDAGES/DRESSINGS) ×2 IMPLANT
SUT VIC AB 1 CT1 18XBRD ANBCTR (SUTURE) ×2 IMPLANT
SUT VIC AB 1 CT1 8-18 (SUTURE) ×2
SUT VIC AB 2-0 CP2 18 (SUTURE) ×4 IMPLANT
SYR 20ML LL LF (SYRINGE) IMPLANT
TOWEL GREEN STERILE (TOWEL DISPOSABLE) ×2 IMPLANT
TOWEL GREEN STERILE FF (TOWEL DISPOSABLE) ×2 IMPLANT
TRAY FOLEY MTR SLVR 16FR STAT (SET/KITS/TRAYS/PACK) ×2 IMPLANT
WATER STERILE IRR 1000ML POUR (IV SOLUTION) ×2 IMPLANT

## 2020-11-25 NOTE — Transfer of Care (Signed)
Immediate Anesthesia Transfer of Care Note  Patient: Ann Diaz  Procedure(s) Performed: Posterior Lumbar Interbody Fusion,  POSTERIOR INSTRUMENTATION. Lumbar three-four, Lumbar four-five (Spine Lumbar)  Patient Location: PACU  Anesthesia Type:General  Level of Consciousness: drowsy  Airway & Oxygen Therapy: Patient Spontanous Breathing and Patient connected to face mask oxygen  Post-op Assessment: Report given to RN and Post -op Vital signs reviewed and stable  Post vital signs: Reviewed and stable  Last Vitals:  Vitals Value Taken Time  BP 132/64 11/25/20 1235  Temp    Pulse 82 11/25/20 1236  Resp 13 11/25/20 1236  SpO2 100 % 11/25/20 1236  Vitals shown include unvalidated device data.  Last Pain:  Vitals:   11/25/20 0614  TempSrc:   PainSc: 4       Patients Stated Pain Goal: 3 ([id number])  Complications: No notable events documented.

## 2020-11-25 NOTE — H&P (Signed)
Subjective: The patient is a 75 year old white female who is complained of back and left great and right leg pain consistent with neurogenic claudication/lumbar radiculopathy.  She has failed medical management and was worked up with lumbar x-rays and lumbar MRI which demonstrated an L3-4 and L4-5 spondylolisthesis and spinal stenosis.  I discussed the various treatment options with her.  She has decided proceed with surgery.  Past Medical History:  Diagnosis Date   Anxiety    Arthritis    GERD (gastroesophageal reflux disease)    PONV (postoperative nausea and vomiting)     Past Surgical History:  Procedure Laterality Date   CHOLECYSTECTOMY     EYE SURGERY     FRACTURE SURGERY     TUBAL LIGATION      Allergies  Allergen Reactions   Sulfa Antibiotics Nausea And Vomiting and Other (See Comments)    hypokalemia SEVERE hypokalemia    Codeine Nausea And Vomiting   Morphine Nausea And Vomiting   Meperidine Hives    Social History   Tobacco Use   Smoking status: Never   Smokeless tobacco: Never  Substance Use Topics   Alcohol use: Never    History reviewed. No pertinent family history. Prior to Admission medications   Medication Sig Start Date End Date Taking? Authorizing Provider  acetaminophen (TYLENOL) 500 MG tablet Take 1,000 mg by mouth every 6 (six) hours as needed (for pain.).   Yes [provider]  ALPRAZolam (XANAX) 0.5 MG tablet Take 0.25-0.5 mg by mouth 2 (two) times daily as needed for anxiety. 07/09/20  Yes [provider]  aspirin EC 81 MG tablet Take 81 mg by mouth in the morning. Swallow whole.   Yes [provider]  bisoprolol-hydrochlorothiazide (ZIAC) 2.5-6.25 MG tablet Take 1 tablet by mouth in the morning. 09/21/20  Yes [provider]  Calcium Carb-Cholecalciferol (CALCIUM 600+D3 PO) Take 1 tablet by mouth in the morning.   Yes [provider]  famotidine (PEPCID) 20 MG tablet Take 20 mg by mouth daily as needed for  heartburn or indigestion.   Yes [provider]  ibuprofen (ADVIL) 200 MG tablet Take 400 mg by mouth every 8 (eight) hours as needed (for pain.).   Yes [provider]  Multiple Vitamin (MULTIVITAMIN WITH MINERALS) TABS tablet Take 1 tablet by mouth in the morning.   Yes [provider]  naproxen sodium (ALEVE) 220 MG tablet Take 440 mg by mouth 2 (two) times daily as needed (pain.).   Yes [provider]  Omega-3 Fatty Acids (FISH OIL) 1000 MG CAPS Take 1,000 mg by mouth in the morning.   Yes [provider]  oxymetazoline (AFRIN) 0.05 % nasal spray Place 1 spray into both nostrils at bedtime.   Yes [provider]  prednisoLONE acetate (PRED FORTE) 1 % ophthalmic suspension Place 1 drop into the left eye 2 (two) times a week. Mondays & Wednesdays   Yes [provider]     Review of Systems  Positive ROS: As above  All other systems have been reviewed and were otherwise negative with the exception of those mentioned in the HPI and as above.  Objective: Vital signs in last 24 hours: Temp:  [97.9 F (36.6 C)] 97.9 F (36.6 C) (09/12 0548) Pulse Rate:  [65] 65 (09/12 0548) Resp:  [18] 18 (09/12 0548) BP: (161)/(77) 161/77 (09/12 0548) SpO2:  [99 %] 99 % (09/12 0548) Weight:  [73.5 kg] 73.5 kg (09/12 0548) Estimated body mass index is  23.24 kg/m as calculated from the following:   Height as of this encounter: '5\' 10"'$  (1.778 m).   Weight as of this encounter: 73.5 kg.   General Appearance: Alert Head: Normocephalic, without obvious abnormality, atraumatic Eyes: PERRL, conjunctiva/corneas clear, EOM's intact,    Ears: Normal  Throat: Normal  Neck: Supple, Back: unremarkable Lungs: Clear to auscultation bilaterally, respirations unlabored Heart: Regular rate and rhythm, no murmur, rub or gallop Abdomen: Soft, non-tender Extremities: Extremities normal, atraumatic, no cyanosis or edema Skin: unremarkable  NEUROLOGIC:    Mental status: alert and oriented,Motor Exam - grossly normal Sensory Exam - grossly normal Reflexes:  Coordination - grossly normal Gait - grossly normal Balance - grossly normal Cranial Nerves: I: smell Not tested  II: visual acuity  OS: Normal  OD: Normal   II: visual fields Full to confrontation  II: pupils Equal, round, reactive to light  III,VII: ptosis None  III,IV,VI: extraocular muscles  Full ROM  V: mastication Normal  V: facial light touch sensation  Normal  V,VII: corneal reflex  Present  VII: facial muscle function - upper  Normal  VII: facial muscle function - lower Normal  VIII: hearing Not tested  IX: soft palate elevation  Normal  IX,X: gag reflex Present  XI: trapezius strength  5/5  XI: sternocleidomastoid strength 5/5  XI: neck flexion strength  5/5  XII: tongue strength  Normal    Data Review Lab Results  Component Value Date   WBC 7.2 11/21/2020   HGB 13.9 11/21/2020   HCT 42.2 11/21/2020   MCV 87.4 11/21/2020   PLT 207 11/21/2020   Lab Results  Component Value Date   NA 140 11/14/2020   K 4.3 11/14/2020   CL 105 11/14/2020   CO2 25 11/14/2020   BUN 17 11/14/2020   CREATININE 0.98 11/14/2020   GLUCOSE 84 11/14/2020   No results found for: INR, PROTIME  Assessment/Plan: L3-4 and L4-5 spondylolisthesis, spinal stenosis, facet arthropathy, lumbago, lumbar radiculopathy, neurogenic claudication: I have discussed the situation with the patient.  I reviewed her imaging studies with her and pointed out the abnormalities.  We have discussed the various treatment options including surgery.  I have described the surgical treatment option of an L3-4 and L4-5 decompression, instrumentation and fusion.  I have shown her surgical models.  I gave her a surgical pamphlet.  We have discussed the risk, benefits, alternatives, expected postop course, and likelihood of achieving our goals with surgery.  I have answered all her questions.  She has decided proceed  with surgery.   [name] 11/25/2020 7:16 AM

## 2020-11-25 NOTE — Anesthesia Postprocedure Evaluation (Signed)
Anesthesia Post Note  Patient: Kaylei Womelsdorf Rautio  Procedure(s) Performed: Posterior Lumbar Interbody Fusion,  POSTERIOR INSTRUMENTATION. Lumbar three-four, Lumbar four-five (Spine Lumbar)     Patient location during evaluation: PACU Anesthesia Type: General Level of consciousness: awake Pain management: pain level controlled Vital Signs Assessment: post-procedure vital signs reviewed and stable Respiratory status: spontaneous breathing Cardiovascular status: stable Postop Assessment: no apparent nausea or vomiting Anesthetic complications: no   No notable events documented.  Last Vitals:  Vitals:   11/25/20 1320 11/25/20 1335  BP: 136/66 133/67  Pulse: 72 79  Resp: 13 12  Temp:  36.4 C  SpO2: 97% 93%    Last Pain:  Vitals:   11/25/20 1330  TempSrc:   PainSc: Asleep    LLE Motor Response: Purposeful movement;Responds to commands (11/25/20 1335) LLE Sensation: Full sensation (11/25/20 1335) RLE Motor Response: Purposeful movement;Responds to commands (11/25/20 1335) RLE Sensation: Full sensation (11/25/20 1335)      Folasade Mooty

## 2020-11-25 NOTE — Anesthesia Procedure Notes (Signed)
Procedure Name: Intubation Date/Time: 11/25/2020 7:59 AM Performed by: Carolan Clines, CRNA Pre-anesthesia Checklist: Patient identified, Emergency Drugs available, Suction available and Patient being monitored Patient Re-evaluated:Patient Re-evaluated prior to induction Oxygen Delivery Method: Circle System Utilized Preoxygenation: Pre-oxygenation with 100% oxygen Induction Type: IV induction Ventilation: Mask ventilation without difficulty Laryngoscope Size: Mac and 3 Grade View: Grade II Tube type: Oral Tube size: 7.0 mm Number of attempts: 1 Airway Equipment and Method: Stylet Placement Confirmation: ETT inserted through vocal cords under direct vision, positive ETCO2 and breath sounds checked- equal and bilateral Secured at: 22 cm Tube secured with: Tape Dental Injury: Teeth and Oropharynx as per pre-operative assessment

## 2020-11-25 NOTE — Op Note (Signed)
Brief history: The patient is a 75 year old white female who has complained of back and left great and right leg pain consistent with neurogenic claudication/lumbar radiculopathy.  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  I discussed the various treatment options with her.  She decided to proceed with surgery.  Preoperative diagnosis: L3-4 spondylolisthesis, L3-4 and L4-5 facet arthropathy, spinal stenosis compressing L3, L4 and L5 nerve roots; lumbago; lumbar radiculopathy; neurogenic claudication  Postoperative diagnosis: The same  Procedure: Bilateral L3-4 and L4-5 laminotomy/foraminotomies/medial facetectomy to decompress the bilateral L3, L4 and L5 nerve roots(the work required to do this was in addition to the work required to do the posterior lumbar interbody fusion because of the patient's spinal stenosis, facet arthropathy. Etc. requiring a wide decompression of the nerve roots.);  Left L3-4 and L4-5 transforaminal lumbar interbody fusion with local morselized autograft bone and Zimmer DBM; insertion of interbody prosthesis at L3-4 and L4-5 (globus peek expandable interbody prosthesis); posterior segmental instrumentation from L3 to L5 with globus titanium pedicle screws and rods; posterior lateral arthrodesis at L3-4 and L4-5 with local morselized autograft bone and Zimmer DBM.  Surgeon: Dr. Earle Gell  Asst.: Arnetha Massy, NP  Anesthesia: Gen. endotracheal  Estimated blood loss: 250 cc  Drains: None  Complications: None  Description of procedure: The patient was brought to the operating room by the anesthesia team. General endotracheal anesthesia was induced. The patient was turned to the prone position on the Wilson frame. The patient's lumbosacral region was then prepared with Betadine scrub and Betadine solution. Sterile drapes were applied.  I then injected the  area to be incised with Marcaine with epinephrine solution. I then used the scalpel to make a linear midline incision over the L3-4 and L4-5 interspace. I then used electrocautery to perform a bilateral subperiosteal dissection exposing the spinous process and lamina of L3-4 and L4-5. We then obtained intraoperative radiograph to confirm our location. We then inserted the Verstrac retractor to provide exposure.  I began the decompression by using the high speed drill to perform laminotomies at L3-4 and L4-5 bilaterally. We then used the Kerrison punches to widen the laminotomy and removed the ligamentum flavum at L3-4 and L4-5 bilaterally. We used the Kerrison punches to remove the medial facets at L3-4 and L4-5 bilaterally, and removed the left facets at L3-4 and L4-5. We performed wide foraminotomies about the bilateral L3, L4 and L5 nerve roots completing the decompression.  We now turned our attention to the posterior lumbar interbody fusion. I used a scalpel to incise the intervertebral disc at L3-4 and L4-5 bilaterally. I then performed a partial intervertebral discectomy at L3-4 and L4-5 bilaterally using the pituitary forceps. We prepared the vertebral endplates at [id number] and [id number] bilaterally for the fusion by removing the soft tissues with the curettes. We then used the trial spacers to pick the appropriate sized interbody prosthesis. We prefilled his prosthesis with a combination of local morselized autograft bone that we obtained during the decompression as well as Zimmer DBM. We inserted the prefilled prosthesis into the interspace at L3-4 and L4-5 from the left, we then turned and expanded the prosthesis. There was a good snug fit of the prosthesis in the interspace. We then filled and the remainder of the intervertebral disc space with local morselized autograft bone and Zimmer DBM. This completed the posterior lumbar interbody arthrodesis.  During the decompression and insertion of the  prosthesis  the assistant protected the thecal sac and nerve roots with the D'Errico retractor.  We now turned attention to the instrumentation. Under fluoroscopic guidance we cannulated the bilateral L3, L4 and L5 pedicles with the bone probe. We then removed the bone probe. We then tapped the pedicle with a 6.5 millimeter tap. We then removed the tap. We probed inside the tapped pedicle with a ball probe to rule out cortical breaches. We then inserted a 7.5 x 45 and 50 millimeter pedicle screw into the L3, L4 and L5 pedicles bilaterally under fluoroscopic guidance.  The left L5 pedicle fractured medially.  I then removed the medical/superior facet at L5 on the left.  I remove that screw and replace it with a 6.5 x 50 mm pedicle screw we then palpated along the medial aspect of the pedicles to rule out cortical breaches. There were none. The nerve roots were not injured. We then connected the unilateral pedicle screws with a lordotic rod. We compressed the construct and secured the rod in place with the caps. We then tightened the caps appropriately. This completed the instrumentation from L3-L5 bilaterally.  We now turned our attention to the posterior lateral arthrodesis at L3-4 and L4-5. We used the high-speed drill to decorticate the remainder of the facets, pars, transverse process at L3-4 and L4-5. We then applied a combination of local morselized autograft bone and Zimmer DBM over these decorticated posterior lateral structures. This completed the posterior lateral arthrodesis.  We then obtained hemostasis using bipolar electrocautery. We irrigated the wound out with bacitracin solution. We inspected the thecal sac and nerve roots and noted they were well decompressed. We then removed the retractor. We reapproximated patient's thoracolumbar fascia with interrupted #1 Vicryl suture. We reapproximated patient's subcutaneous tissue with interrupted 2-0 Vicryl suture. The reapproximated patient's skin with  Steri-Strips and benzoin. The wound was then coated with bacitracin ointment. A sterile dressing was applied. The drapes were removed. The patient was subsequently returned to the supine position where they were extubated by the anesthesia team. He was then transported to the post anesthesia care unit in stable condition. All sponge instrument and needle counts were reportedly correct at the end of this case.

## 2020-11-26 LAB — BASIC METABOLIC PANEL
Anion gap: 7 (ref 5–15)
BUN: 13 mg/dL (ref 8–23)
CO2: 26 mmol/L (ref 22–32)
Calcium: 8.9 mg/dL (ref 8.9–10.3)
Chloride: 102 mmol/L (ref 98–111)
Creatinine, Ser: 0.82 mg/dL (ref 0.44–1.00)
GFR, Estimated: >60 mL/min (ref >60)
Glucose, Bld: 108 mg/dL — ABNORMAL HIGH (ref 70–99)
Potassium: 4.5 mmol/L (ref 3.5–5.1)
Sodium: 135 mmol/L (ref 135–145)

## 2020-11-26 LAB — CBC
HCT: 31.7 % — ABNORMAL LOW (ref 36.0–46.0)
Hemoglobin: 10.6 g/dL — ABNORMAL LOW (ref 12.0–15.0)
MCH: 28.6 pg (ref 26.0–34.0)
MCHC: 33.4 g/dL (ref 30.0–36.0)
MCV: 85.7 fL (ref 80.0–100.0)
Platelets: 144 10*3/uL — ABNORMAL LOW (ref 150–400)
RBC: 3.7 MIL/uL — ABNORMAL LOW (ref 3.87–5.11)
RDW: 13.5 % (ref 11.5–15.5)
WBC: 6.5 10*3/uL (ref 4.0–10.5)
nRBC: 0 % (ref 0.0–0.2)

## 2020-11-26 MED ORDER — OXYCODONE-ACETAMINOPHEN 5-325 MG PO TABS
1.0000 | ORAL_TABLET | ORAL | Status: DC | PRN
  Administered 2020-11-26 – 2020-11-27 (×3): 2 via ORAL
  Filled 2020-11-26 (×3): qty 2

## 2020-11-26 NOTE — Evaluation (Signed)
Occupational Therapy Evaluation Patient Details Name: Ann Diaz MRN: [id number] DOB: 1946/03/14 Today's Date: 11/26/2020   History of Present Illness Pt is a 75 year old female s/p L3-4, L4-5 fusion. PMH significant for anxiety, arthritis, GERD.   Clinical Impression   PTA, pt was living with husband who is the primary driver. Pt was Independent in ADLs and all other IADLs. Pt currently requiring set-up-mod I for UB ADLs, Min guard for LB ADLs, and min guard for functional mobility. Pt educated on compensatory techniques to adhere to back precautions for ADLs and functional mobility. Pt verbalized 3/3 back precautions upon arrival and demonstrated understanding of all back precautions during activity. Pt with good understanding of precautions, safety, and good caregiver support. Therefore, recommending home with 24 hr supervision upon d/c. All acute OT needs met at this time.      Recommendations for follow up therapy are one component of a multi-disciplinary discharge planning process, led by the attending physician.  Recommendations may be updated based on patient status, additional functional criteria and insurance authorization.   Follow Up Recommendations  No OT follow up;Supervision/Assistance - 24 hour    Equipment Recommendations  None recommended by OT    Recommendations for Other Services       Precautions / Restrictions Precautions Precautions: Back Required Braces or Orthoses: Spinal Brace Spinal Brace: Lumbar corset Restrictions Weight Bearing Restrictions: No      Mobility Bed Mobility Overal bed mobility: Modified Independent             General bed mobility comments: Educated on log roll technique to sit EOB, once educated completed with increased time.    Transfers Overall transfer level: Needs assistance Equipment used: Rolling walker (2 wheeled) Transfers: Sit to/from Stand           General transfer comment: Pt required mutiple attempts  and min guard for safety while weight shifting forward and RW to balance once in standing. Pt ambulated in hallway wihtout use of RW and completed stand to sit wihtout RW and with min guard for safety.    Balance Overall balance assessment: Mild deficits observed, not formally tested                                         ADL either performed or assessed with clinical judgement   ADL Overall ADL's : Needs assistance/impaired Eating/Feeding: Modified independent;Sitting   Grooming: Standing;Supervision/safety   Upper Body Bathing: Modified independent;Sitting   Lower Body Bathing: Sitting/lateral leans;Min guard   Upper Body Dressing : Sitting;Set up Upper Body Dressing Details (indicate cue type and reason): Assisted to donn lumbar brace, placed in correct area. Lower Body Dressing: Sit to/from stand;Min guard;Cueing for compensatory techniques Lower Body Dressing Details (indicate cue type and reason): Pt educated on compensatory technique to avoid bending. Pt required min guard while completing side-sitting EOB in figure four position to donn socks Toilet Transfer: Magazine features editor Details (indicate cue type and reason): Pt educated on safe sit<>stand with RW and correct hand placement. Pt needed min guard for safety and required multiple attempts to stand. (Simulated with bed) Toileting- Clothing Manipulation and Hygiene: Min guard;Sit to/from stand   Tub/ Shower Transfer: Walk-in shower;Min guard;Adhering to back precautions   Functional mobility during ADLs: Min guard General ADL Comments: Pt with increased pain in R hip, decreased ROM, and decreased functional strength limiting ability to  complete ADLs/IADLs witthout caregiver available for safety. Pt educated on back precautions and given handout to educate on compensatory strategies to adhere to back precautions during functional mobility and self care.     Vision Baseline Vision/History: 0 No  visual deficits Ability to See in Adequate Light: 0 Adequate Patient Visual Report: No change from baseline Vision Assessment?: No apparent visual deficits     Perception     Praxis      Pertinent Vitals/Pain Pain Assessment: Faces Faces Pain Scale: Hurts little more Pain Location: R leg Pain Descriptors / Indicators: Aching;Discomfort Pain Intervention(s): Monitored during session     Hand Dominance     Extremity/Trunk Assessment Upper Extremity Assessment Upper Extremity Assessment: Generalized weakness   Lower Extremity Assessment Lower Extremity Assessment: Defer to PT evaluation   Cervical / Trunk Assessment Cervical / Trunk Assessment: Other exceptions (s/p lumbar fusion)   Communication Communication Communication: No difficulties   Cognition Arousal/Alertness: Awake/alert Behavior During Therapy: WFL for tasks assessed/performed Overall Cognitive Status: Within Functional Limits for tasks assessed                                 General Comments: Pt able to follow simple commands with increased time. Pt able to maintain conversation during functional activity.   General Comments  Husband present    Exercises     Shoulder Instructions      Home Living Family/patient expects to be discharged to:: Private residence Living Arrangements: Spouse/significant other Available Help at Discharge: Family Type of Home: House Home Access: Stairs to enter [employer] of Steps: 1 Entrance Stairs-Rails: None Home Layout: Two level;Able to live on main level with bedroom/bathroom Alternate Level Stairs-Number of Steps: 6   Bathroom Shower/Tub: Tub/shower unit;Walk-in shower   Bathroom Toilet: Handicapped height     Home Equipment: Environmental consultant - 2 wheels;Cane - single point;Crutches;Bedside commode;Shower seat   Additional Comments: Pt reports she does not need to access the second floor of her home. Has spouse that is available 24/7 after  d/c      Prior Functioning/Environment Level of Independence: Needs assistance        Comments: Pt needed assistance for transportation and spouse was driving, completes all other ADL/IADLs on her own.        OT Problem List: Decreased strength;Decreased range of motion;Pain      OT Treatment/Interventions:      OT Goals(Current goals can be found in the care plan section) Acute Rehab OT Goals Patient Stated Goal: To go home OT Goal Formulation: With patient Time For Goal Achievement: 11/26/20 Potential to Achieve Goals: Good  OT Frequency:     Barriers to D/C:            Co-evaluation              AM-PAC OT "6 Clicks" Daily Activity     Outcome Measure Help from another person eating meals?: None Help from another person taking care of personal grooming?: A Little Help from another person toileting, which includes using toliet, bedpan, or urinal?: A Little Help from another person bathing (including washing, rinsing, drying)?: A Little Help from another person to put on and taking off regular upper body clothing?: A Little Help from another person to put on and taking off regular lower body clothing?: A Little 6 Click Score: 19   End of Session Equipment Utilized During Treatment: Gait belt;Back brace;Rolling walker Nurse Communication:  Mobility status  Activity Tolerance: Patient tolerated treatment well Patient left: in bed;with call bell/phone within reach;with bed alarm set;with family/visitor present  OT Visit Diagnosis: Unsteadiness on feet (R26.81);Muscle weakness (generalized) (M62.81);Pain Pain - Right/Left: Right Pain - part of body: Hip;Leg                Time: 9753-0051 OT Time Calculation (min): 21 min Charges:  OT General Charges $OT Visit: 1 Visit OT Evaluation $OT Eval Low Complexity: 1 Low  Jackquline Denmark, OTS Acute Rehab Office: [phone]   Lauren DeView 11/26/2020, 8:54 AM

## 2020-11-26 NOTE — Evaluation (Signed)
Physical Therapy Evaluation  Patient Details Name: Ann Diaz MRN: [id number] DOB: [birth year] Today's Date: 11/26/2020  History of Present Illness  Pt is a 75 year old female s/p L3-4, L4-5 fusion. PMH significant for anxiety, arthritis, GERD.  Clinical Impression  Pt admitted with above diagnosis. At the time of PT eval, pt was able to demonstrate transfers and ambulation with gross min guard assist to min assist and RW for support. Pt was educated on precautions, brace application/wearing schedule, appropriate activity progression, and car transfer. Pt currently with functional limitations due to the deficits listed below (see PT Problem List). Pt will benefit from skilled PT to increase their independence and safety with mobility to allow discharge to the venue listed below.         Recommendations for follow up therapy are one component of a multi-disciplinary discharge planning process, led by the attending physician.  Recommendations may be updated based on patient status, additional functional criteria and insurance authorization.  Follow Up Recommendations No PT follow up;Supervision for mobility/OOB    Equipment Recommendations  None recommended by PT    Recommendations for Other Services       Precautions / Restrictions Precautions Precautions: Back Precaution Booklet Issued: Yes (comment) Precaution Comments: Reviewed handout and pt was cued for precautions during functional mobility. Required Braces or Orthoses: Spinal Brace Spinal Brace: Lumbar corset;Applied in sitting position Restrictions Weight Bearing Restrictions: No      Mobility  Bed Mobility Overal bed mobility: Modified Independent             General bed mobility comments: HOB flat and rails lowered to simulate home environment.    Transfers Overall transfer level: Needs assistance Equipment used: Rolling walker (2 wheeled) Transfers: Sit to/from Stand Sit to Stand: Min assist          General transfer comment: Multiple attempts from a raised surface. Assist to power up to full stand.  Ambulation/Gait Ambulation/Gait assistance: Min guard Gait Distance (Feet): 400 Feet Assistive device: Rolling walker (2 wheeled) Gait Pattern/deviations: Step-through pattern;Decreased stride length Gait velocity: Decreased Gait velocity interpretation: <1.31 ft/sec, indicative of household ambulator General Gait Details: VC's for improved posture, closer walker proximity, and forward gaze. No assist required however close guard provided for safety.  Stairs Stairs: Yes Stairs assistance: Min guard Stair Management: One rail Right;Step to pattern;Forwards Number of Stairs: 6 General stair comments: VC's for sequencing and general safety. No assist required.  Wheelchair Mobility    Modified Rankin (Stroke Patients Only)       Balance Overall balance assessment: Mild deficits observed, not formally tested                                           Pertinent Vitals/Pain Pain Assessment: Faces Faces Pain Scale: Hurts little more Pain Location: R leg Pain Descriptors / Indicators: Aching;Discomfort Pain Intervention(s): Limited activity within patient's tolerance;Monitored during session;Repositioned    Home Living Family/patient expects to be discharged to:: Private residence Living Arrangements: Spouse/significant other Available Help at Discharge: Family Type of Home: House Home Access: Stairs to enter Entrance Stairs-Rails: None Entrance Stairs-Number of Steps: 1 Home Layout: Two level;Able to live on main level with bedroom/bathroom Home Equipment: Gilford Rile - 2 wheels;Cane - single point;Crutches;Bedside commode;Shower seat Additional Comments: Pt reports she does not need to access the second floor of her home. Has spouse that is available 24/7  after d/c    Prior Function Level of Independence: Needs assistance         Comments: Pt needed  assistance for transportation and spouse was driving, completes all other ADL/IADLs on her own.     Hand Dominance        Extremity/Trunk Assessment   Upper Extremity Assessment Upper Extremity Assessment: Defer to OT evaluation    Lower Extremity Assessment Lower Extremity Assessment: Defer to PT evaluation    Cervical / Trunk Assessment Cervical / Trunk Assessment: Other exceptions Cervical / Trunk Exceptions: s/p lumbar fusion  Communication   Communication: No difficulties  Cognition Arousal/Alertness: Awake/alert Behavior During Therapy: WFL for tasks assessed/performed Overall Cognitive Status: Within Functional Limits for tasks assessed                                 General Comments: Pt able to follow simple commands with increased time. Pt able to maintain conversation during functional activity.      General Comments General comments (skin integrity, edema, etc.): Husband present    Exercises     Assessment/Plan    PT Assessment Patient needs continued PT services  PT Problem List Decreased strength;Decreased activity tolerance;Decreased balance;Decreased mobility;Decreased knowledge of use of DME;Decreased safety awareness;Decreased knowledge of precautions;Pain       PT Treatment Interventions DME instruction;Gait training;Functional mobility training;Therapeutic activities;Stair training;Balance training;Neuromuscular re-education;Cognitive remediation;Patient/family education    PT Goals (Current goals can be found in the Care Plan section)  Acute Rehab PT Goals Patient Stated Goal: To go home PT Goal Formulation: With patient/family Time For Goal Achievement: 12/03/20 Potential to Achieve Goals: Good    Frequency Min 5X/week   Barriers to discharge        Co-evaluation               AM-PAC PT "6 Clicks" Mobility  Outcome Measure Help needed turning from your back to your side while in a flat bed without using bedrails?:  None Help needed moving from lying on your back to sitting on the side of a flat bed without using bedrails?: None Help needed moving to and from a bed to a chair (including a wheelchair)?: A Little Help needed standing up from a chair using your arms (e.g., wheelchair or bedside chair)?: A Little Help needed to walk in hospital room?: A Little Help needed climbing 3-5 steps with a railing? : A Little 6 Click Score: 20    End of Session Equipment Utilized During Treatment: Gait belt;Back brace Activity Tolerance: Patient tolerated treatment well Patient left: in bed;with call bell/phone within reach;with family/visitor present Nurse Communication: Mobility status PT Visit Diagnosis: Unsteadiness on feet (R26.81);Pain Pain - part of body:  (back, RLE)    Time: [id number] PT Time Calculation (min) (ACUTE ONLY): 35 min   Charges:   PT Evaluation $PT Eval Low Complexity: 1 Low PT Treatments $Gait Training: 8-22 mins        Ann Diaz, PT, DPT Acute Rehabilitation Services Pager: [phone] Office: [phone]   Thelma Comp 11/26/2020, 11:15 AM

## 2020-11-26 NOTE — Progress Notes (Signed)
Subjective: The patient is alert and pleasant.  She may want to stay another day.  Her husband is at the bedside.  Objective: Vital signs in last 24 hours: Temp:  [97.6 F (36.4 C)-99 F (37.2 C)] 98.4 F (36.9 C) (09/13 0337) Pulse Rate:  [72-89] 75 (09/13 0337) Resp:  [11-18] 18 (09/13 0337) BP: (124-140)/(53-74) 128/55 (09/13 0337) SpO2:  [93 %-100 %] 99 % (09/13 0337) Estimated body mass index is 23.24 kg/m as calculated from the following:   Height as of this encounter: '5\' 10"'$  (1.778 m).   Weight as of this encounter: 73.5 kg.   Intake/Output from previous day: 09/12 0701 - 09/13 0700 In: 2580 [P.O.:480; I.V.:1600; IV Piggyback:500] Out: 505 [Urine:255; Blood:250] Intake/Output this shift: No intake/output data recorded.  Physical exam the patient is alert and oriented.  Her strength is normal.  Lab Results: Recent Labs    11/26/20 0443  WBC 6.5  HGB 10.6*  HCT 31.7*  PLT 144*   BMET Recent Labs    11/26/20 0443  NA 135  K 4.5  CL 102  CO2 26  GLUCOSE 108*  BUN 13  CREATININE 0.82  CALCIUM 8.9    Studies/Results: DG Lumbar Spine 2-3 Views  Result Date: 11/25/2020 CLINICAL DATA:  Lumbar fusion EXAM: LUMBAR SPINE - 2-3 VIEW COMPARISON:  Intraoperative film from earlier in the same day. FLUOROSCOPY TIME:  Radiation Exposure Index (as provided by the fluoroscopic device): 28.81 mGy If the device does not provide the exposure index: Fluoroscopy Time:  29 seconds Number of Acquired Images:  2 FINDINGS: Images demonstrate evidence of interbody fusion at L3-4 and L4-5. Pedicle screws are noted at all 3 levels without posterior fixation elements. IMPRESSION: Lumbar fusion from L3-L5. Electronically Signed   By: Inez Catalina M.D.   On: 11/25/2020 13:32   DG Lumbar Spine 1 View  Result Date: 11/25/2020 CLINICAL DATA:  Intraoperative image for localization EXAM: LUMBAR SPINE - 1 VIEW COMPARISON:  Lumbar spine radiographs 06/11/2020 FINDINGS: A single lateral  intraoperative radiograph is submitted. Surgical hardware is noted just posterior to the L3-L4 facet joints. There is grade 1 anterolisthesis of L3 on L4. There is intervertebral disc space narrowing and vacuum disc phenomenon at L4-L5. Alignment is otherwise normal. IMPRESSION: Surgical hardware noted just posterior to the L3-L4 facet joints. Electronically Signed   By: Valetta Mole M.D.   On: 11/25/2020 12:46   DG C-Arm 1-60 Min-No Report  Result Date: 11/25/2020 Fluoroscopy was utilized by the requesting physician.  No radiographic interpretation.    Assessment/Plan: Postop day 1: The patient is doing well.  We will mobilize her with PT and OT.  I gave her her discharge instructions and answered all her questions.  LOS: 1 day     [name] 11/26/2020, 7:50 AM     Patient ID: Ann Diaz, female   DOB: Mar 29, 1945, 75 y.o.   MRN: [id number]

## 2020-11-27 MED ORDER — OXYCODONE-ACETAMINOPHEN 5-325 MG PO TABS: 1.0000 | ORAL_TABLET | ORAL | 0 refills | Status: DC | PRN

## 2020-11-27 MED ORDER — CYCLOBENZAPRINE HCL 5 MG PO TABS: 5.0000 mg | ORAL_TABLET | Freq: Three times a day (TID) | ORAL | 1 refills | Status: DC | PRN

## 2020-11-27 MED ORDER — OXYCODONE-ACETAMINOPHEN 5-325 MG PO TABS
1.0000 | ORAL_TABLET | ORAL | Status: DC | PRN
Start: 2020-11-27 — End: 2020-11-27

## 2020-11-27 MED ORDER — DOCUSATE SODIUM 100 MG PO CAPS: 100.0000 mg | ORAL_CAPSULE | Freq: Two times a day (BID) | ORAL | 0 refills | Status: DC

## 2020-11-27 MED ORDER — CYCLOBENZAPRINE HCL 5 MG PO TABS: 5.0000 mg | ORAL_TABLET | Freq: Three times a day (TID) | ORAL | Status: DC | PRN

## 2020-11-27 NOTE — Discharge Summary (Signed)
Physician Discharge Summary  Patient ID: Ann Diaz MRN: [id number] DOB/AGE: 1946-01-29 75 y.o.  Admit date: 11/25/2020 Discharge date: 11/27/2020  Admission Diagnoses: L3-4 and L4-5 spinal stenosis, spinal stenosis, lumbago, lumbar radiculopathy, neurogenic claudication  Discharge Diagnoses: The same Active Problems:   Spondylolisthesis, lumbar region   Discharged Condition: good  Hospital Course: I performed an L3-4 and L4-5 decompression, instrumentation and fusion on the patient on 11/25/2020.  The surgery went well.  The patient's postoperative course was unremarkable.  On postoperative day #2 she requested discharge to home.  The patient, and her husband, were given verbal and written discharge instructions.  All her questions were answered.  Consults: PT, OT, care management Significant Diagnostic Studies: None Treatments: L3-4 and L4-5 decompression, instrumentation and fusion. Discharge Exam: Blood pressure (!) 142/75, pulse 87, temperature 98.7 F (37.1 C), temperature source Oral, resp. rate 16, height '5\' 10"'$  (1.778 m), weight 73.5 kg, SpO2 97 %. The patient is alert and pleasant.  Her lower extremity strength is normal.  Disposition: Home  Discharge Instructions     Call MD for:  difficulty breathing, headache or visual disturbances   Complete by: As directed    Call MD for:  extreme fatigue   Complete by: As directed    Call MD for:  hives   Complete by: As directed    Call MD for:  persistant dizziness or light-headedness   Complete by: As directed    Call MD for:  persistant nausea and vomiting   Complete by: As directed    Call MD for:  redness, tenderness, or signs of infection (pain, swelling, redness, odor or green/yellow discharge around incision site)   Complete by: As directed    Call MD for:  severe uncontrolled pain   Complete by: As directed    Call MD for:  temperature >100.4   Complete by: As directed    Diet - low sodium heart healthy    Complete by: As directed    Discharge instructions   Complete by: As directed    Call [phone] for a followup appointment. Take a stool softener while you are using pain medications.   Driving Restrictions   Complete by: As directed    Do not drive for 2 weeks.   Increase activity slowly   Complete by: As directed    Lifting restrictions   Complete by: As directed    Do not lift more than 5 pounds. No excessive bending or twisting.   May shower / Bathe   Complete by: As directed    Remove the dressing for 3 days after surgery.  You may shower, but leave the incision alone.   Remove dressing in 24 hours   Complete by: As directed       Allergies as of 11/27/2020       Reactions   Sulfa Antibiotics Nausea And Vomiting, Other (See Comments)   hypokalemia SEVERE hypokalemia   Codeine Nausea And Vomiting   Morphine Nausea And Vomiting   Meperidine Hives        Medication List     STOP taking these medications    acetaminophen 500 MG tablet Commonly known as: TYLENOL   ibuprofen 200 MG tablet Commonly known as: ADVIL   naproxen sodium 220 MG tablet Commonly known as: ALEVE       TAKE these medications    ALPRAZolam 0.5 MG tablet Commonly known as: XANAX Take 0.25-0.5 mg by mouth 2 (two) times daily as needed for anxiety.  aspirin EC 81 MG tablet Take 81 mg by mouth in the morning. Swallow whole.   bisoprolol-hydrochlorothiazide 2.5-6.25 MG tablet Commonly known as: ZIAC Take 1 tablet by mouth in the morning.   CALCIUM 600+D3 PO Take 1 tablet by mouth in the morning.   cyclobenzaprine 5 MG tablet Commonly known as: FLEXERIL Take 1 tablet (5 mg total) by mouth 3 (three) times daily as needed for muscle spasms.   docusate sodium 100 MG capsule Commonly known as: COLACE Take 1 capsule (100 mg total) by mouth 2 (two) times daily.   famotidine 20 MG tablet Commonly known as: PEPCID Take 20 mg by mouth daily as needed for heartburn or  indigestion.   Fish Oil 1000 MG Caps Take 1,000 mg by mouth in the morning.   multivitamin with minerals Tabs tablet Take 1 tablet by mouth in the morning.   oxyCODONE-acetaminophen 5-325 MG tablet Commonly known as: PERCOCET/ROXICET Take 1-2 tablets by mouth every 4 (four) hours as needed for moderate pain.   oxymetazoline 0.05 % nasal spray Commonly known as: AFRIN Place 1 spray into both nostrils at bedtime.   prednisoLONE acetate 1 % ophthalmic suspension Commonly known as: PRED FORTE Place 1 drop into the left eye 2 (two) times a week. Mondays & Wednesdays         Signed: [name] 11/27/2020, 10:06 AM

## 2020-11-27 NOTE — Progress Notes (Signed)
Patient was transported via wheelchair by volunteer for discharge home; in no acute distress nor complaints of pain nor discomfort; incision on her back was clean, dry and intact, room was checked and accounted for all her belongings; discharge instructions given to patient and her husband and both verbalized understanding on the instructions given.

## 2020-11-27 NOTE — Progress Notes (Signed)
Physical Therapy Treatment Patient Details Name: Ann Diaz MRN: [id number] DOB: February 25, 1946 Today's Date: 11/27/2020   History of Present Illness Pt is a 75 year old female s/p L3-4, L4-5 fusion. PMH significant for anxiety, arthritis, GERD.    PT Comments    Pt progressing well with post-op mobility. She was able to demonstrate transfers and ambulation with gross min guard assist to supervision for safety and RW for support. Reinforced education on precautions, brace application/wearing schedule, appropriate activity progression, and car transfer. Will continue to follow.      Recommendations for follow up therapy are one component of a multi-disciplinary discharge planning process, led by the attending physician.  Recommendations may be updated based on patient status, additional functional criteria and insurance authorization.  Follow Up Recommendations  No PT follow up;Supervision for mobility/OOB     Equipment Recommendations  None recommended by PT    Recommendations for Other Services       Precautions / Restrictions Precautions Precautions: Back Precaution Booklet Issued: Yes (comment) Precaution Comments: Reviewed handout and pt was cued for precautions during functional mobility. Required Braces or Orthoses: Spinal Brace Spinal Brace: Lumbar corset;Applied in sitting position Restrictions Weight Bearing Restrictions: No     Mobility  Bed Mobility Overal bed mobility: Modified Independent             General bed mobility comments: HOB flat and rails lowered to simulate home environment. VC's for optimal log roll technique.    Transfers Overall transfer level: Needs assistance Equipment used: Rolling walker (2 wheeled) Transfers: Sit to/from Stand Sit to Stand: Supervision         General transfer comment: Bed slightly elevated. Increased time due to pain and weakness however no assist required today.  Ambulation/Gait Ambulation/Gait assistance:  Supervision Gait Distance (Feet): 400 Feet Assistive device: Rolling walker (2 wheeled) Gait Pattern/deviations: Step-through pattern;Decreased stride length Gait velocity: Decreased Gait velocity interpretation: 1.31 - 2.62 ft/sec, indicative of limited community ambulator General Gait Details: VC's for improved posture, closer walker proximity, and forward gaze. Pt with increased trunk movement, seemingly to gain momentum to advance LLE. With cues, pt able to make corrective changes and improve.   Stairs         General stair comments: Pt declined stair training again today. Reports feeling comfortable.   Wheelchair Mobility    Modified Rankin (Stroke Patients Only)       Balance Overall balance assessment: Mild deficits observed, not formally tested                                          Cognition Arousal/Alertness: Awake/alert Behavior During Therapy: WFL for tasks assessed/performed Overall Cognitive Status: Within Functional Limits for tasks assessed                                        Exercises      General Comments        Pertinent Vitals/Pain Pain Assessment: 0-10 Pain Score: 7  Pain Location: R leg, low back Pain Descriptors / Indicators: Aching;Discomfort;Operative site guarding Pain Intervention(s): Limited activity within patient's tolerance;Monitored during session;Repositioned    Home Living                      Prior Function  PT Goals (current goals can now be found in the care plan section) Acute Rehab PT Goals Patient Stated Goal: To go home PT Goal Formulation: With patient/family Time For Goal Achievement: 12/03/20 Potential to Achieve Goals: Good Progress towards PT goals: Progressing toward goals    Frequency    Min 5X/week      PT Plan Current plan remains appropriate    Co-evaluation              AM-PAC PT "6 Clicks" Mobility   Outcome Measure  Help  needed turning from your back to your side while in a flat bed without using bedrails?: None Help needed moving from lying on your back to sitting on the side of a flat bed without using bedrails?: None Help needed moving to and from a bed to a chair (including a wheelchair)?: A Little Help needed standing up from a chair using your arms (e.g., wheelchair or bedside chair)?: A Little Help needed to walk in hospital room?: A Little Help needed climbing 3-5 steps with a railing? : A Little 6 Click Score: 20    End of Session Equipment Utilized During Treatment: Gait belt;Back brace Activity Tolerance: Patient tolerated treatment well Patient left: in bed;with call bell/phone within reach;with family/visitor present Nurse Communication: Mobility status PT Visit Diagnosis: Unsteadiness on feet (R26.81);Pain Pain - part of body:  (back, RLE)     Time: [id number] PT Time Calculation (min) (ACUTE ONLY): 16 min  Charges:  $Gait Training: 8-22 mins                     Rolinda Roan, PT, DPT Acute Rehabilitation Services Pager: [phone] Office: [phone]    Thelma Comp 11/27/2020, 10:19 AM

## 2020-11-28 MED FILL — Sodium Chloride IV Soln 0.9%: INTRAVENOUS | Qty: 1000 | Status: AC

## 2020-11-28 MED FILL — Heparin Sodium (Porcine) Inj 1000 Unit/ML: INTRAMUSCULAR | Qty: 30 | Status: AC

## 2020-12-17 DIAGNOSIS — M4316 Spondylolisthesis, lumbar region: Secondary | ICD-10-CM | POA: Diagnosis not present

## 2020-12-30 DIAGNOSIS — Z23 Encounter for immunization: Secondary | ICD-10-CM | POA: Diagnosis not present

## 2021-01-17 DIAGNOSIS — H18513 Endothelial corneal dystrophy, bilateral: Secondary | ICD-10-CM | POA: Diagnosis not present

## 2021-01-17 DIAGNOSIS — H1812 Bullous keratopathy, left eye: Secondary | ICD-10-CM | POA: Diagnosis not present

## 2021-01-17 DIAGNOSIS — Z947 Corneal transplant status: Secondary | ICD-10-CM | POA: Diagnosis not present

## 2021-01-20 DIAGNOSIS — E7800 Pure hypercholesterolemia, unspecified: Secondary | ICD-10-CM | POA: Diagnosis not present

## 2021-01-20 DIAGNOSIS — Z6824 Body mass index (BMI) 24.0-24.9, adult: Secondary | ICD-10-CM | POA: Diagnosis not present

## 2021-01-20 DIAGNOSIS — J309 Allergic rhinitis, unspecified: Secondary | ICD-10-CM | POA: Diagnosis not present

## 2021-01-20 DIAGNOSIS — Z79899 Other long term (current) drug therapy: Secondary | ICD-10-CM | POA: Diagnosis not present

## 2021-01-20 DIAGNOSIS — Z0000 Encounter for general adult medical examination without abnormal findings: Secondary | ICD-10-CM | POA: Diagnosis not present

## 2021-01-20 DIAGNOSIS — K219 Gastro-esophageal reflux disease without esophagitis: Secondary | ICD-10-CM | POA: Diagnosis not present

## 2021-01-20 DIAGNOSIS — Z1331 Encounter for screening for depression: Secondary | ICD-10-CM | POA: Diagnosis not present

## 2021-01-20 DIAGNOSIS — F410 Panic disorder [episodic paroxysmal anxiety] without agoraphobia: Secondary | ICD-10-CM | POA: Diagnosis not present

## 2021-01-20 DIAGNOSIS — I10 Essential (primary) hypertension: Secondary | ICD-10-CM | POA: Diagnosis not present

## 2021-02-12 DIAGNOSIS — I10 Essential (primary) hypertension: Secondary | ICD-10-CM | POA: Diagnosis not present

## 2021-02-12 DIAGNOSIS — E7800 Pure hypercholesterolemia, unspecified: Secondary | ICD-10-CM | POA: Diagnosis not present

## 2021-03-28 DIAGNOSIS — Z1231 Encounter for screening mammogram for malignant neoplasm of breast: Secondary | ICD-10-CM | POA: Diagnosis not present

## 2021-04-01 DIAGNOSIS — Z9889 Other specified postprocedural states: Secondary | ICD-10-CM | POA: Diagnosis not present

## 2021-04-01 DIAGNOSIS — M545 Low back pain, unspecified: Secondary | ICD-10-CM | POA: Diagnosis not present

## 2021-04-01 DIAGNOSIS — G8929 Other chronic pain: Secondary | ICD-10-CM | POA: Diagnosis not present

## 2021-04-01 DIAGNOSIS — M4316 Spondylolisthesis, lumbar region: Secondary | ICD-10-CM | POA: Diagnosis not present

## 2021-06-10 DIAGNOSIS — Z6824 Body mass index (BMI) 24.0-24.9, adult: Secondary | ICD-10-CM | POA: Diagnosis not present

## 2021-06-10 DIAGNOSIS — E7800 Pure hypercholesterolemia, unspecified: Secondary | ICD-10-CM | POA: Diagnosis not present

## 2021-06-10 DIAGNOSIS — J309 Allergic rhinitis, unspecified: Secondary | ICD-10-CM | POA: Diagnosis not present

## 2021-06-11 DIAGNOSIS — E7800 Pure hypercholesterolemia, unspecified: Secondary | ICD-10-CM | POA: Diagnosis not present

## 2021-06-30 DIAGNOSIS — M545 Low back pain, unspecified: Secondary | ICD-10-CM | POA: Diagnosis not present

## 2021-06-30 DIAGNOSIS — G8929 Other chronic pain: Secondary | ICD-10-CM | POA: Diagnosis not present

## 2021-07-04 DIAGNOSIS — M48061 Spinal stenosis, lumbar region without neurogenic claudication: Secondary | ICD-10-CM | POA: Diagnosis not present

## 2021-07-04 DIAGNOSIS — M545 Low back pain, unspecified: Secondary | ICD-10-CM | POA: Diagnosis not present

## 2021-07-04 DIAGNOSIS — M5126 Other intervertebral disc displacement, lumbar region: Secondary | ICD-10-CM | POA: Diagnosis not present

## 2021-07-08 DIAGNOSIS — M545 Low back pain, unspecified: Secondary | ICD-10-CM | POA: Diagnosis not present

## 2021-07-08 DIAGNOSIS — S32009K Unspecified fracture of unspecified lumbar vertebra, subsequent encounter for fracture with nonunion: Secondary | ICD-10-CM | POA: Diagnosis not present

## 2021-07-15 ENCOUNTER — Other Ambulatory Visit: Payer: Self-pay | Admitting: Neurosurgery

## 2021-07-22 DIAGNOSIS — Z947 Corneal transplant status: Secondary | ICD-10-CM | POA: Diagnosis not present

## 2021-07-22 DIAGNOSIS — H18513 Endothelial corneal dystrophy, bilateral: Secondary | ICD-10-CM | POA: Diagnosis not present

## 2021-07-22 DIAGNOSIS — H1812 Bullous keratopathy, left eye: Secondary | ICD-10-CM | POA: Diagnosis not present

## 2021-07-25 NOTE — Pre-Procedure Instructions (Signed)
? ? Ann Diaz Going ? 07/25/2021  ?  ?  ? Surgical Instructions ? ? Your procedure is scheduled on Thurs., Jul 31, 2021 from 7:30AM-11:20AM. ? Report to [facility] Main Entrance "A" at 5:30 A.M., then check in with the Admitting office. ? Call this number if you have problems the morning of surgery: ? [phone] ? ? Remember: ? Do not eat or drink after midnight on May 17th ?  ? Take these medicines the morning of surgery with A SIP OF WATER: ?PrednisoLONE acetate (PRED FORTE) ? ?If Needed: ?Acetaminophen (TYLENOL) ?ALPRAZolam Ann Diaz) ?Famotidine (PEPCID) ? ?Follow your surgeon's instructions on when to stop Aspirin.  If no instructions were given by your surgeon then you will need to call the office to get those instructions.   ? ?As of today, STOP taking any Aspirin (unless otherwise instructed by your surgeon) Aleve, Naproxen, Ibuprofen, Motrin, Advil, Goody's, BC's, all herbal medications, fish oil, and all vitamins. ? ?           Day of Surgery: ?Do not wear jewelry or makeup ?Do not wear lotions, powders, perfumes/colognes, or deodorant. ?Do not shave 48 hours prior to surgery.   ?Do not bring valuables to the hospital. ?DO Not wear nail polish, gel polish, artificial nails, or any other type of covering on natural nails (fingers and toes) ?If you have artificial nails or gel coating that need to be removed by a nail salon, please have this removed prior to surgery. Artificial nails or gel coating may interfere with anesthesia's ability to adequately monitor your vital signs. ? ?           [name] is not responsible for any belongings or valuables. ? ?Do NOT Smoke (Tobacco/Vaping)  24 hours prior to your procedure ? ?If you use a CPAP at night, you may bring your mask and machine for your overnight stay. ?  ?Contacts, glasses, hearing aids, dentures or partials may not be worn into surgery, please bring cases for these belongings ?  ?For patients admitted to the hospital, discharge time will be determined  by your treatment team. ?  ?Patients discharged the day of surgery will not be allowed to drive home, and someone needs to stay with them for 24 hours. ? ?NO VISITORS WILL BE ALLOWED IN PRE-OP WHERE PATIENTS ARE PREPPED FOR SURGERY.   ? ?SURGICAL WAITING ROOM VISITATION ?Patients having surgery or a procedure in a hospital may have two support people. ?Children under the age of 35 must have an adult with them who is not the patient. ?They may stay in the waiting area during the procedure and may switch out with other visitors. If the patient needs to stay at the hospital during part of their recovery, the visitor guidelines for inpatient rooms apply. ? ?Please refer to the [facility] website for the visitor guidelines for Inpatients (after your surgery is over and you are in a regular room).  ? ?Special instructions:   ? ?Oral Hygiene is also important to reduce your risk of infection.  Remember - BRUSH YOUR TEETH THE MORNING OF SURGERY WITH YOUR REGULAR TOOTHPASTE ? ?[facility]- Preparing For Surgery ? ?Before surgery, you can play an important role. Because skin is not sterile, your skin needs to be as free of germs as possible. You can reduce the number of germs on your skin by washing with CHG (chlorahexidine gluconate) Soap before surgery.  CHG is an antiseptic cleaner which kills germs and bonds with the skin to  continue killing germs even after washing.   ? ?Please do not use if you have an allergy to CHG or antibacterial soaps. If your skin becomes reddened/irritated stop using the CHG.  ?Do not shave (including legs and underarms) for at least 48 hours prior to first CHG shower. It is OK to shave your face. ? ?Please follow these instructions carefully. ?  ? Shower the NIGHT BEFORE SURGERY and the MORNING OF SURGERY with CHG Soap.  ? If you chose to wash your hair, wash your hair first as usual with your normal shampoo. After you shampoo, rinse your hair and body thoroughly to remove the shampoo.  Then  [employer] and genitals (private parts) with your normal soap and rinse thoroughly to remove soap. ? ?After that Use CHG Soap as you would any other liquid soap. You can apply CHG directly to the skin and wash gently with a scrungie or a clean washcloth.  ? ?Apply the CHG Soap to your body ONLY FROM THE NECK DOWN.  Do not use on open wounds or open sores. Avoid contact with your eyes, ears, mouth and genitals (private parts). Wash Face and genitals (private parts)  with your normal soap.  ? ?Wash thoroughly, paying special attention to the area where your surgery will be performed. ? ?Thoroughly rinse your body with warm water from the neck down. ? ?DO NOT shower/wash with your normal soap after using and rinsing off the CHG Soap. ? ?Pat yourself dry with a CLEAN TOWEL. ? ?Wear CLEAN PAJAMAS to bed the night before surgery ? ?Place CLEAN SHEETS on your bed the night before your surgery ? ?DO NOT SLEEP WITH PETS. ? ?Reminder: ?Take a shower with CHG soap. ?Wear Clean/Comfortable clothing the morning of surgery ?Do not apply any deodorants/lotions.   ?Remember to brush your teeth WITH YOUR REGULAR TOOTHPASTE. ? ?Notify your provider: ? if you develop a fever of 100.4 or greater, sneezing, cough, sore throat, shortness of breath or body aches. ? ?Please read over the following fact sheets that you were given.  ?  ? ? ?  ?

## 2021-07-28 ENCOUNTER — Encounter (HOSPITAL_COMMUNITY)
Admission: RE | Admit: 2021-07-28 | Discharge: 2021-07-28 | Disposition: A | Discharge status: Home / Self Care | Payer: Medicare HMO | Source: Ambulatory Visit | Attending: Neurosurgery | Admitting: Neurosurgery

## 2021-07-28 ENCOUNTER — Encounter (HOSPITAL_COMMUNITY): Payer: Self-pay

## 2021-07-28 ENCOUNTER — Other Ambulatory Visit: Payer: Self-pay

## 2021-07-28 VITALS — BP 166/86 | HR 58 | Temp 97.9°F | Resp 18 | Ht 70.0 in | Wt 165.1 lb

## 2021-07-28 DIAGNOSIS — I700 Atherosclerosis of aorta: Secondary | ICD-10-CM | POA: Insufficient documentation

## 2021-07-28 DIAGNOSIS — F419 Anxiety disorder, unspecified: Secondary | ICD-10-CM | POA: Diagnosis present

## 2021-07-28 DIAGNOSIS — Z79899 Other long term (current) drug therapy: Secondary | ICD-10-CM | POA: Diagnosis not present

## 2021-07-28 DIAGNOSIS — Z9889 Other specified postprocedural states: Secondary | ICD-10-CM | POA: Diagnosis not present

## 2021-07-28 DIAGNOSIS — I10 Essential (primary) hypertension: Secondary | ICD-10-CM | POA: Insufficient documentation

## 2021-07-28 DIAGNOSIS — M1990 Unspecified osteoarthritis, unspecified site: Secondary | ICD-10-CM | POA: Diagnosis present

## 2021-07-28 DIAGNOSIS — K219 Gastro-esophageal reflux disease without esophagitis: Secondary | ICD-10-CM | POA: Insufficient documentation

## 2021-07-28 DIAGNOSIS — Z981 Arthrodesis status: Secondary | ICD-10-CM | POA: Diagnosis not present

## 2021-07-28 DIAGNOSIS — Z888 Allergy status to other drugs, medicaments and biological substances status: Secondary | ICD-10-CM | POA: Diagnosis not present

## 2021-07-28 DIAGNOSIS — M48061 Spinal stenosis, lumbar region without neurogenic claudication: Secondary | ICD-10-CM | POA: Insufficient documentation

## 2021-07-28 DIAGNOSIS — M960 Pseudarthrosis after fusion or arthrodesis: Secondary | ICD-10-CM | POA: Diagnosis present

## 2021-07-28 DIAGNOSIS — Z01812 Encounter for preprocedural laboratory examination: Secondary | ICD-10-CM | POA: Insufficient documentation

## 2021-07-28 DIAGNOSIS — Z7982 Long term (current) use of aspirin: Secondary | ICD-10-CM | POA: Diagnosis not present

## 2021-07-28 DIAGNOSIS — Z01818 Encounter for other preprocedural examination: Secondary | ICD-10-CM

## 2021-07-28 DIAGNOSIS — Z885 Allergy status to narcotic agent status: Secondary | ICD-10-CM | POA: Diagnosis not present

## 2021-07-28 DIAGNOSIS — Z882 Allergy status to sulfonamides status: Secondary | ICD-10-CM | POA: Diagnosis not present

## 2021-07-28 HISTORY — DX: Essential (primary) hypertension: I10

## 2021-07-28 LAB — CBC
HCT: 39.7 % (ref 36.0–46.0)
Hemoglobin: 13 g/dL (ref 12.0–15.0)
MCH: 28.8 pg (ref 26.0–34.0)
MCHC: 32.7 g/dL (ref 30.0–36.0)
MCV: 87.8 fL (ref 80.0–100.0)
Platelets: 202 10*3/uL (ref 150–400)
RBC: 4.52 MIL/uL (ref 3.87–5.11)
RDW: 13.5 % (ref 11.5–15.5)
WBC: 7.1 10*3/uL (ref 4.0–10.5)
nRBC: 0 % (ref 0.0–0.2)

## 2021-07-28 LAB — BASIC METABOLIC PANEL
Anion gap: 8 (ref 5–15)
BUN: 15 mg/dL (ref 8–23)
CO2: 25 mmol/L (ref 22–32)
Calcium: 9.7 mg/dL (ref 8.9–10.3)
Chloride: 106 mmol/L (ref 98–111)
Creatinine, Ser: 0.9 mg/dL (ref 0.44–1.00)
GFR, Estimated: >60 mL/min (ref >60)
Glucose, Bld: 80 mg/dL (ref 70–99)
Potassium: 4.2 mmol/L (ref 3.5–5.1)
Sodium: 139 mmol/L (ref 135–145)

## 2021-07-28 LAB — TYPE AND SCREEN
ABO/RH(D): A NEG
Antibody Screen: NEGATIVE

## 2021-07-28 LAB — SURGICAL PCR SCREEN
MRSA, PCR: POSITIVE — AB
Staphylococcus aureus: POSITIVE — AB

## 2021-07-28 NOTE — Progress Notes (Addendum)
PCP - Bertram Millard ?Cardiologist - denies cardiologist or cardiac workup ? ?PPM/ICD - denies ?Chest x-ray - N/A ?EKG - 11/14/20 ?Stress Test - denies ?ECHO - denies ?Cardiac Cath - denies ? ?Sleep Study - denies ?Aspirin Instructions:Patient's last dose of Aspirin 07/26/21 ? ?ERAS Protcol - NPO order ?COVID TEST- N/A ? ?BP 173/82, HR 58- patient denies cardiac symptoms. Pt reports she took her blood pressure medication today and her BP medication was recently increased. Patient does not check BP at home. BP re-check with manual BP cuff 166/86 at PAT.  ? ?Per Ebony Hail, [place]- ok to take Ziac on DOS.  ? ?Anesthesia review: D/t elevated BP at PAT, requested records from PCP- last office note- pt reports she saw PCP in January.  ? ?Patient denies shortness of breath, fever, cough and chest pain at PAT appointment ? ? ?All instructions explained to the patient, with a verbal understanding of the material. Patient agrees to go over the instructions while at home for a better understanding. Patient also instructed to self quarantine after being tested for COVID-19. The opportunity to ask questions was provided. ? ? ?

## 2021-07-28 NOTE — Progress Notes (Signed)
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 Voicemail left for Dr. Adline Mango surgery scheduler to notify. Pt will receive Profend in nose on DOS.  ?

## 2021-07-28 NOTE — Pre-Procedure Instructions (Signed)
? ? Ann Diaz ? 07/28/2021  ?  ?  ? Surgical Instructions ? ? Your procedure is scheduled on Thurs., Jul 31, 2021 from 7:30AM-11:20AM. ? Report to [facility] Main Entrance "A" at 5:30 A.M., then check in with the Admitting office. ? Call this number if you have problems the morning of Ann: ? [phone] ? ? Remember: ? Do not eat or drink after midnight on May 17th ?  ? Take these medicines the morning of Ann with A SIP OF WATER: ?bisoprolol-hydrochlorothiazide Ann Diaz)  ?PrednisoLONE acetate (PRED FORTE) ? ?If Needed: ?Acetaminophen (TYLENOL) ?ALPRAZolam Ann Diaz) ?Famotidine (PEPCID) ? ?Follow your surgeon's instructions on when to stop Aspirin.  If no instructions were given by your surgeon then you will need to call the office to get those instructions.   ? ?As of today, STOP taking any Aspirin (unless otherwise instructed by your surgeon) Aleve, Naproxen, Ibuprofen, Motrin, Advil, Goody's, BC's, all herbal medications, fish oil, and all vitamins. ? ?           Day of Ann: ?Do not wear jewelry or makeup ?Do not wear lotions, powders, perfumes/colognes, or deodorant. ?Do not shave 48 hours prior to Ann.   ?Do not bring valuables to the [facility]. ?DO Not wear nail polish, gel polish, artificial nails, or any other type of covering on natural nails (fingers and toes) ?If you have artificial nails or gel coating that need to be removed by a nail salon, please have this removed prior to Ann. Artificial nails or gel coating may interfere with anesthesia's ability to adequately monitor your vital signs. ? ?           [facility] is not responsible for any belongings or valuables. ? ?Do NOT Smoke (Tobacco/Vaping)  24 hours prior to your procedure ? ?If you use a CPAP at night, you may bring your mask and machine for your overnight stay. ?  ?Contacts, glasses, hearing aids, dentures or partials may not be worn into Ann, please bring cases for these belongings ?  ?For patients admitted to the  [facility], discharge time will be determined by your treatment team. ?  ?Patients discharged the day of Ann will not be allowed to drive home, and someone needs to stay with them for 24 hours. ? ?NO VISITORS WILL BE ALLOWED IN PRE-OP WHERE PATIENTS ARE PREPPED FOR Ann.   ? ?SURGICAL WAITING ROOM VISITATION ?Patients having Ann or a procedure in a [facility] may have two support people. ?Children under the age of 81 must have an adult with them who is not the patient. ?They may stay in the waiting area during the procedure and may switch out with other visitors. If the patient needs to stay at the [facility] during part of their recovery, the visitor guidelines for inpatient rooms apply. ? ?Please refer to the [facility] website for the visitor guidelines for Inpatients (after your Ann is over and you are in a regular room).  ? ?Special instructions:   ? ?Oral Hygiene is also important to reduce your risk of infection.  Remember - BRUSH YOUR TEETH THE MORNING OF Ann WITH YOUR REGULAR TOOTHPASTE ? ?[facility]- Preparing For Ann ? ?Before Ann, you can play an important role. Because skin is not sterile, your skin needs to be as free of germs as possible. You can reduce the number of germs on your skin by washing with CHG (chlorahexidine gluconate) Soap before Ann.  CHG is an antiseptic cleaner which kills germs and bonds with  the skin to continue killing germs even after washing.   ? ?Please do not use if you have an allergy to CHG or antibacterial soaps. If your skin becomes reddened/irritated stop using the CHG.  ?Do not shave (including legs and underarms) for at least 48 hours prior to first CHG shower. It is OK to shave your face. ? ?Please follow these instructions carefully. ?  ? Shower the NIGHT BEFORE Ann and the MORNING OF Ann with CHG Soap.  ? If you chose to wash your hair, wash your hair first as usual with your normal shampoo. After you shampoo, rinse your hair and body  thoroughly to remove the shampoo.  Then [employer] and genitals (private parts) with your normal soap and rinse thoroughly to remove soap. ? ?After that Use CHG Soap as you would any other liquid soap. You can apply CHG directly to the skin and wash gently with a scrungie or a clean washcloth.  ? ?Apply the CHG Soap to your body ONLY FROM THE NECK DOWN.  Do not use on open wounds or open sores. Avoid contact with your eyes, ears, mouth and genitals (private parts). Wash Face and genitals (private parts)  with your normal soap.  ? ?Wash thoroughly, paying special attention to the area where your Ann will be performed. ? ?Thoroughly rinse your body with warm water from the neck down. ? ?DO NOT shower/wash with your normal soap after using and rinsing off the CHG Soap. ? ?Pat yourself dry with a CLEAN TOWEL. ? ?Wear CLEAN PAJAMAS to bed the night before Ann ? ?Place CLEAN SHEETS on your bed the night before your Ann ? ?DO NOT SLEEP WITH PETS. ? ?Reminder: ?Take a shower with CHG soap. ?Wear Clean/Comfortable clothing the morning of Ann ?Do not apply any deodorants/lotions.   ?Remember to brush your teeth WITH YOUR REGULAR TOOTHPASTE. ? ?Notify your provider: ? if you develop a fever of 100.4 or greater, sneezing, cough, sore throat, shortness of breath or body aches. ? ?Please read over the following fact sheets that you were given.  ?  ? ? ?  ?

## 2021-07-29 NOTE — Progress Notes (Signed)
Anesthesia Chart Review: ? Case: [id number] Date/Time: 07/31/21 0715  ? Procedure: EXPLORE LUMBAR FUSION, REVISION OF L34, L45 POSTERIOR INSTRUMENTATION AND FUSION  ? Anesthesia type: General  ? Pre-op diagnosis: LUMBAR PSEUDOARTHROSIS  ? Location: [facility] ROOM 21 / [facility]  ? Surgeons: Newman Pies, MD  ? ?  ? ? ?DISCUSSION: Patient is a 76 year old female scheduled for the above procedure. ? ?History includes never smoker, post-operative N/V, GERD, HTN, spinal surgery (L3-5 PLIF 11/25/20).  ? ?BP 173/82_166/86 at PAT. Denied chest pain and SOB. She had taken her bisoprolol-HCTZ 5-6.25 mg and will take on the morning of surgery as it includes b-blocker.  ? ?Reported last aspirin 07/26/2021.  Anesthesia team to evaluate on the day of surgery. ? ? ?VS: BP (!) 166/86 Comment: manual BP to left arm  Pulse (!) 58   Temp 36.6 ?C (Oral)   Resp 18   Ht '5\' 10"'$  (1.778 m)   Wt 74.9 kg   SpO2 100%   BMI 23.69 kg/m?  ? ? ?PROVIDERS: ?Mateo Flow, MD is PCP, records requested but are pending.   ? ? ?LABS: Labs reviewed: Acceptable for surgery. ?(all labs ordered are listed, but only abnormal results are displayed) ? ?Labs Reviewed  ?SURGICAL PCR SCREEN - Abnormal; Notable for the following components:  ?    Result Value  ? MRSA, PCR POSITIVE (*)   ? Staphylococcus aureus POSITIVE (*)   ? All other components within normal limits  ?BASIC METABOLIC PANEL  ?CBC  ?TYPE AND SCREEN  ? ? ? ?IMAGES: ?CT L-spine 07/04/21 (Canopy/PACS): ?IMPRESSION: ?1. L3-5 PLIF with mild lucency around the L5 transpedicular screws, ?which may indicate loosening. ?2. No central spinal canal stenosis. ?3. Mild left L5-S1 neural foraminal stenosis. ?- Aortic Atherosclerosis (ICD10-I70.0).  ? ? ?EKG: 11/14/20: ?Sinus bradycardia at 56 bpm ?Septal infarct , age undetermined ?Abnormal ECG ?No previous tracing ?Confirmed by Shelva Majestic [phone]) on 11/14/2020 6:14:18 PM ? ? ?CV: N/A ? ?Past Medical History:  ?Diagnosis Date  ? Anxiety   ? Arthritis   ? GERD  (gastroesophageal reflux disease)   ? Hypertension   ? PONV (postoperative nausea and vomiting)   ? ? ?Past Surgical History:  ?Procedure Laterality Date  ? BACK SURGERY    ? 11/25/2020  ? CHOLECYSTECTOMY    ? EYE SURGERY    ? FRACTURE SURGERY Left   ? left wrist  ? TUBAL LIGATION    ? ? ?MEDICATIONS: ? acetaminophen (TYLENOL) 500 MG tablet  ? ALPRAZolam (XANAX) 0.5 MG tablet  ? aspirin EC 81 MG tablet  ? bisoprolol-hydrochlorothiazide (ZIAC) 5-6.25 MG tablet  ? Calcium Carb-Cholecalciferol (CALCIUM 600+D3 PO)  ? cyclobenzaprine (FLEXERIL) 5 MG tablet  ? docusate sodium (COLACE) 100 MG capsule  ? famotidine (PEPCID) 20 MG tablet  ? Multiple Vitamin (MULTIVITAMIN WITH MINERALS) TABS tablet  ? Omega-3 Fatty Acids (FISH OIL) 1000 MG CAPS  ? oxyCODONE-acetaminophen (PERCOCET/ROXICET) 5-325 MG tablet  ? oxymetazoline (AFRIN) 0.05 % nasal spray  ? prednisoLONE acetate (PRED FORTE) 1 % ophthalmic suspension  ? Turmeric 500 MG CAPS  ? vitamin B-12 (CYANOCOBALAMIN) 500 MCG tablet  ? ?No current facility-administered medications for this encounter.  ? ? ?Myra Gianotti, PA-C ?Surgical Short Stay/Anesthesiology ?[facility] Phone [phone] ?[facility] Phone [phone] ?07/29/2021 12:27 PM ? ? ? ? ? ? ? ?

## 2021-07-29 NOTE — Anesthesia Preprocedure Evaluation (Addendum)
Anesthesia Evaluation  Patient identified by MRN, date of birth, ID band Patient awake    Reviewed: Allergy & Precautions, H&P , NPO status , Patient's Chart, lab work & pertinent test results, reviewed documented beta blocker date and time   History of Anesthesia Complications (+) PONV and history of anesthetic complications  Airway Mallampati: II  TM Distance: >3 FB Neck ROM: Full    Dental no notable dental hx. (+) Teeth Intact, Dental Advisory Given   Pulmonary neg pulmonary ROS,    Pulmonary exam normal breath sounds clear to auscultation       Cardiovascular hypertension, Pt. on medications and Pt. on home beta blockers  Rhythm:Regular Rate:Normal     Neuro/Psych Anxiety negative neurological ROS     GI/Hepatic Neg liver ROS, GERD  Medicated,  Endo/Other  negative endocrine ROS  Renal/GU negative Renal ROS  negative genitourinary   Musculoskeletal  (+) Arthritis , Osteoarthritis,    Abdominal   Peds  Hematology negative hematology ROS (+)   Anesthesia Other Findings   Reproductive/Obstetrics negative OB ROS                           Anesthesia Physical Anesthesia Plan  ASA: 2  Anesthesia Plan: General   Post-op Pain Management: Tylenol PO (pre-op)*   Induction: Intravenous  PONV Risk Score and Plan: 4 or greater and Ondansetron, Dexamethasone and Treatment may vary due to age or medical condition  Airway Management Planned: Oral ETT  Additional Equipment:   Intra-op Plan:   Post-operative Plan: Extubation in OR  Informed Consent: I have reviewed the patients History and Physical, chart, labs and discussed the procedure including the risks, benefits and alternatives for the proposed anesthesia with the patient or authorized representative who has indicated his/her understanding and acceptance.     Dental advisory given  Plan Discussed with: CRNA  Anesthesia Plan  Comments: (PAT note written 07/29/2021 by Myra Gianotti, PA-C. )       Anesthesia Quick Evaluation

## 2021-07-31 ENCOUNTER — Other Ambulatory Visit: Payer: Self-pay

## 2021-07-31 ENCOUNTER — Inpatient Hospital Stay (HOSPITAL_COMMUNITY): Payer: Medicare HMO | Admitting: Vascular Surgery

## 2021-07-31 ENCOUNTER — Inpatient Hospital Stay (HOSPITAL_COMMUNITY): Payer: Medicare HMO

## 2021-07-31 ENCOUNTER — Encounter (HOSPITAL_COMMUNITY): Payer: Self-pay | Admitting: Neurosurgery

## 2021-07-31 ENCOUNTER — Inpatient Hospital Stay (HOSPITAL_COMMUNITY)
Admission: RE | Disposition: A | Discharge status: Home / Self Care | Payer: Self-pay | Source: Home / Self Care | Attending: Neurosurgery

## 2021-07-31 ENCOUNTER — Inpatient Hospital Stay (HOSPITAL_COMMUNITY): Payer: Medicare HMO | Admitting: Certified Registered"

## 2021-07-31 ENCOUNTER — Inpatient Hospital Stay (HOSPITAL_COMMUNITY)
Admission: RE | Admit: 2021-07-31 | Discharge: 2021-08-01 | DRG: 460 | Disposition: A | Discharge status: Home / Self Care | Payer: Medicare HMO | Attending: Neurosurgery | Admitting: Neurosurgery

## 2021-07-31 DIAGNOSIS — Z882 Allergy status to sulfonamides status: Secondary | ICD-10-CM | POA: Diagnosis not present

## 2021-07-31 DIAGNOSIS — Z7982 Long term (current) use of aspirin: Secondary | ICD-10-CM

## 2021-07-31 DIAGNOSIS — F419 Anxiety disorder, unspecified: Secondary | ICD-10-CM | POA: Diagnosis present

## 2021-07-31 DIAGNOSIS — I10 Essential (primary) hypertension: Secondary | ICD-10-CM | POA: Diagnosis present

## 2021-07-31 DIAGNOSIS — Z885 Allergy status to narcotic agent status: Secondary | ICD-10-CM

## 2021-07-31 DIAGNOSIS — S32009K Unspecified fracture of unspecified lumbar vertebra, subsequent encounter for fracture with nonunion: Secondary | ICD-10-CM

## 2021-07-31 DIAGNOSIS — Z888 Allergy status to other drugs, medicaments and biological substances status: Secondary | ICD-10-CM

## 2021-07-31 DIAGNOSIS — Z9889 Other specified postprocedural states: Secondary | ICD-10-CM | POA: Diagnosis not present

## 2021-07-31 DIAGNOSIS — M1990 Unspecified osteoarthritis, unspecified site: Secondary | ICD-10-CM | POA: Diagnosis present

## 2021-07-31 DIAGNOSIS — Z79899 Other long term (current) drug therapy: Secondary | ICD-10-CM | POA: Diagnosis not present

## 2021-07-31 DIAGNOSIS — Z981 Arthrodesis status: Secondary | ICD-10-CM | POA: Diagnosis not present

## 2021-07-31 DIAGNOSIS — M960 Pseudarthrosis after fusion or arthrodesis: Secondary | ICD-10-CM

## 2021-07-31 DIAGNOSIS — K219 Gastro-esophageal reflux disease without esophagitis: Secondary | ICD-10-CM | POA: Diagnosis present

## 2021-07-31 HISTORY — DX: Unspecified fracture of unspecified lumbar vertebra, subsequent encounter for fracture with nonunion: S32.009K

## 2021-07-31 SURGERY — POSTERIOR LUMBAR FUSION 1 LEVEL
Anesthesia: General | Site: Spine Lumbar

## 2021-07-31 MED ORDER — MENTHOL 3 MG MT LOZG: 1.0000 | LOZENGE | OROMUCOSAL | Status: DC | PRN

## 2021-07-31 MED ORDER — THROMBIN 5000 UNITS EX SOLR: OROMUCOSAL | Status: DC | PRN

## 2021-07-31 MED ORDER — HYDROXYZINE HCL 50 MG/ML IM SOLN
50.0000 mg | Freq: Four times a day (QID) | INTRAMUSCULAR | Status: DC | PRN
  Administered 2021-07-31: 50 mg via INTRAMUSCULAR
  Filled 2021-07-31: qty 1

## 2021-07-31 MED ORDER — CHLORHEXIDINE GLUCONATE CLOTH 2 % EX PADS
6.0000 | MEDICATED_PAD | Freq: Once | CUTANEOUS | Status: DC
Start: 2021-07-31 — End: 2021-07-31

## 2021-07-31 MED ORDER — HYDROMORPHONE HCL 1 MG/ML IJ SOLN
INTRAMUSCULAR | Status: AC
  Filled 2021-07-31: qty 1

## 2021-07-31 MED ORDER — PROPOFOL 500 MG/50ML IV EMUL
INTRAVENOUS | Status: DC | PRN
  Administered 2021-07-31: 20 ug/kg/min via INTRAVENOUS

## 2021-07-31 MED ORDER — SODIUM CHLORIDE 0.9 % IV SOLN: 6.2500 mg | Freq: Four times a day (QID) | INTRAVENOUS | Status: DC | PRN

## 2021-07-31 MED ORDER — SUFENTANIL CITRATE 50 MCG/ML IV SOLN
INTRAVENOUS | Status: AC
  Filled 2021-07-31: qty 1

## 2021-07-31 MED ORDER — PHENYLEPHRINE 80 MCG/ML (10ML) SYRINGE FOR IV PUSH (FOR BLOOD PRESSURE SUPPORT)
PREFILLED_SYRINGE | INTRAVENOUS | Status: DC | PRN
  Administered 2021-07-31 (×3): 80 ug via INTRAVENOUS

## 2021-07-31 MED ORDER — ROCURONIUM BROMIDE 10 MG/ML (PF) SYRINGE
PREFILLED_SYRINGE | INTRAVENOUS | Status: DC | PRN
  Administered 2021-07-31: 70 mg via INTRAVENOUS

## 2021-07-31 MED ORDER — DOCUSATE SODIUM 100 MG PO CAPS
100.0000 mg | ORAL_CAPSULE | Freq: Two times a day (BID) | ORAL | Status: DC
  Administered 2021-07-31 – 2021-08-01 (×3): 100 mg via ORAL
  Filled 2021-07-31 (×3): qty 1

## 2021-07-31 MED ORDER — BACITRACIN ZINC 500 UNIT/GM EX OINT
TOPICAL_OINTMENT | CUTANEOUS | Status: DC | PRN
  Administered 2021-07-31: 1 via TOPICAL

## 2021-07-31 MED ORDER — BACITRACIN ZINC 500 UNIT/GM EX OINT
TOPICAL_OINTMENT | CUTANEOUS | Status: AC
  Filled 2021-07-31: qty 28.35

## 2021-07-31 MED ORDER — CYCLOBENZAPRINE HCL 5 MG PO TABS
5.0000 mg | ORAL_TABLET | Freq: Three times a day (TID) | ORAL | Status: DC | PRN
  Administered 2021-07-31 – 2021-08-01 (×2): 5 mg via ORAL
  Filled 2021-07-31 (×2): qty 1

## 2021-07-31 MED ORDER — HYDROMORPHONE HCL 1 MG/ML IJ SOLN
0.2500 mg | INTRAMUSCULAR | Status: DC | PRN
  Administered 2021-07-31 (×2): 0.5 mg via INTRAVENOUS

## 2021-07-31 MED ORDER — DEXAMETHASONE SODIUM PHOSPHATE 10 MG/ML IJ SOLN
INTRAMUSCULAR | Status: DC | PRN
  Administered 2021-07-31: 5 mg via INTRAVENOUS

## 2021-07-31 MED ORDER — ALPRAZOLAM 0.5 MG PO TABS: 0.5000 mg | ORAL_TABLET | Freq: Two times a day (BID) | ORAL | Status: DC | PRN

## 2021-07-31 MED ORDER — BISOPROLOL-HYDROCHLOROTHIAZIDE 5-6.25 MG PO TABS
1.0000 | ORAL_TABLET | Freq: Every morning | ORAL | Status: DC
Start: 2021-08-01 — End: 2021-07-31

## 2021-07-31 MED ORDER — CHLORHEXIDINE GLUCONATE 0.12 % MT SOLN
15.0000 mL | Freq: Once | OROMUCOSAL | Status: AC
  Administered 2021-07-31: 15 mL via OROMUCOSAL
  Filled 2021-07-31: qty 15

## 2021-07-31 MED ORDER — BISACODYL 10 MG RE SUPP: 10.0000 mg | Freq: Every day | RECTAL | Status: DC | PRN

## 2021-07-31 MED ORDER — ACETAMINOPHEN 325 MG PO TABS
650.0000 mg | ORAL_TABLET | ORAL | Status: DC | PRN
  Administered 2021-07-31: 650 mg via ORAL
  Filled 2021-07-31: qty 2

## 2021-07-31 MED ORDER — BISOPROLOL FUMARATE 5 MG PO TABS
5.0000 mg | ORAL_TABLET | Freq: Every day | ORAL | Status: DC
  Administered 2021-08-01: 5 mg via ORAL
  Filled 2021-07-31: qty 1

## 2021-07-31 MED ORDER — OXYCODONE-ACETAMINOPHEN 5-325 MG PO TABS: 1.0000 | ORAL_TABLET | ORAL | Status: DC | PRN

## 2021-07-31 MED ORDER — EPHEDRINE SULFATE-NACL 50-0.9 MG/10ML-% IV SOSY
PREFILLED_SYRINGE | INTRAVENOUS | Status: DC | PRN
  Administered 2021-07-31 (×2): 10 mg via INTRAVENOUS
  Administered 2021-07-31: 5 mg via INTRAVENOUS

## 2021-07-31 MED ORDER — AMISULPRIDE (ANTIEMETIC) 5 MG/2ML IV SOLN
INTRAVENOUS | Status: AC
  Filled 2021-07-31: qty 4

## 2021-07-31 MED ORDER — MIDAZOLAM HCL 2 MG/2ML IJ SOLN
INTRAMUSCULAR | Status: AC
  Filled 2021-07-31: qty 2

## 2021-07-31 MED ORDER — ACETAMINOPHEN 500 MG PO TABS
1000.0000 mg | ORAL_TABLET | Freq: Once | ORAL | Status: AC
  Administered 2021-07-31: 1000 mg via ORAL
  Filled 2021-07-31: qty 2

## 2021-07-31 MED ORDER — ACETAMINOPHEN 500 MG PO TABS
1000.0000 mg | ORAL_TABLET | Freq: Four times a day (QID) | ORAL | Status: AC
Start: 2021-07-31 — End: 2021-08-01
  Administered 2021-07-31 – 2021-08-01 (×4): 1000 mg via ORAL
  Filled 2021-07-31 (×4): qty 2

## 2021-07-31 MED ORDER — MORPHINE SULFATE (PF) 4 MG/ML IV SOLN: 4.0000 mg | INTRAVENOUS | Status: DC | PRN

## 2021-07-31 MED ORDER — CEFAZOLIN SODIUM-DEXTROSE 2-4 GM/100ML-% IV SOLN
2.0000 g | Freq: Three times a day (TID) | INTRAVENOUS | Status: AC
  Administered 2021-07-31 – 2021-08-01 (×2): 2 g via INTRAVENOUS
  Filled 2021-07-31 (×2): qty 100

## 2021-07-31 MED ORDER — ZOLPIDEM TARTRATE 5 MG PO TABS: 5.0000 mg | ORAL_TABLET | Freq: Every evening | ORAL | Status: DC | PRN

## 2021-07-31 MED ORDER — ONDANSETRON HCL 4 MG PO TABS: 4.0000 mg | ORAL_TABLET | Freq: Four times a day (QID) | ORAL | Status: DC | PRN

## 2021-07-31 MED ORDER — LACTATED RINGERS IV SOLN: INTRAVENOUS | Status: DC

## 2021-07-31 MED ORDER — DEXAMETHASONE SODIUM PHOSPHATE 10 MG/ML IJ SOLN
INTRAMUSCULAR | Status: AC
  Filled 2021-07-31: qty 1

## 2021-07-31 MED ORDER — SUGAMMADEX SODIUM 200 MG/2ML IV SOLN
INTRAVENOUS | Status: DC | PRN
  Administered 2021-07-31: 100 mg via INTRAVENOUS

## 2021-07-31 MED ORDER — THROMBIN 5000 UNITS EX SOLR
CUTANEOUS | Status: AC
  Filled 2021-07-31: qty 5000

## 2021-07-31 MED ORDER — BUPIVACAINE LIPOSOME 1.3 % IJ SUSP
INTRAMUSCULAR | Status: AC
  Filled 2021-07-31: qty 20

## 2021-07-31 MED ORDER — ROCURONIUM BROMIDE 10 MG/ML (PF) SYRINGE
PREFILLED_SYRINGE | INTRAVENOUS | Status: AC
  Filled 2021-07-31: qty 10

## 2021-07-31 MED ORDER — PROPOFOL 10 MG/ML IV BOLUS
INTRAVENOUS | Status: AC
  Filled 2021-07-31: qty 20

## 2021-07-31 MED ORDER — ONDANSETRON HCL 4 MG/2ML IJ SOLN
4.0000 mg | Freq: Four times a day (QID) | INTRAMUSCULAR | Status: DC | PRN
  Administered 2021-07-31: 4 mg via INTRAVENOUS
  Filled 2021-07-31: qty 2

## 2021-07-31 MED ORDER — MIDAZOLAM HCL 2 MG/2ML IJ SOLN
INTRAMUSCULAR | Status: DC | PRN
  Administered 2021-07-31: 2 mg via INTRAVENOUS

## 2021-07-31 MED ORDER — CEFAZOLIN SODIUM-DEXTROSE 2-4 GM/100ML-% IV SOLN
2.0000 g | INTRAVENOUS | Status: AC
  Administered 2021-07-31: 2 g via INTRAVENOUS
  Filled 2021-07-31: qty 100

## 2021-07-31 MED ORDER — BUPIVACAINE-EPINEPHRINE 0.5% -1:200000 IJ SOLN
INTRAMUSCULAR | Status: AC
  Filled 2021-07-31: qty 1

## 2021-07-31 MED ORDER — PROMETHAZINE HCL 25 MG/ML IJ SOLN
INTRAMUSCULAR | Status: AC
  Filled 2021-07-31: qty 1

## 2021-07-31 MED ORDER — ACETAMINOPHEN 650 MG RE SUPP: 650.0000 mg | RECTAL | Status: DC | PRN

## 2021-07-31 MED ORDER — ONDANSETRON HCL 4 MG/2ML IJ SOLN
INTRAMUSCULAR | Status: AC
  Filled 2021-07-31: qty 2

## 2021-07-31 MED ORDER — LIDOCAINE 2% (20 MG/ML) 5 ML SYRINGE
INTRAMUSCULAR | Status: DC | PRN
  Administered 2021-07-31: 60 mg via INTRAVENOUS

## 2021-07-31 MED ORDER — BUPIVACAINE LIPOSOME 1.3 % IJ SUSP
INTRAMUSCULAR | Status: DC | PRN
  Administered 2021-07-31: 20 mL

## 2021-07-31 MED ORDER — SODIUM CHLORIDE 0.9 % IV SOLN: 250.0000 mL | INTRAVENOUS | Status: DC

## 2021-07-31 MED ORDER — OXYCODONE HCL 5 MG PO TABS
10.0000 mg | ORAL_TABLET | ORAL | Status: DC | PRN
  Administered 2021-07-31 – 2021-08-01 (×4): 10 mg via ORAL
  Filled 2021-07-31 (×4): qty 2

## 2021-07-31 MED ORDER — 0.9 % SODIUM CHLORIDE (POUR BTL) OPTIME
TOPICAL | Status: DC | PRN
  Administered 2021-07-31: 1000 mL

## 2021-07-31 MED ORDER — VANCOMYCIN HCL IN DEXTROSE 1-5 GM/200ML-% IV SOLN: 1000.0000 mg | Freq: Once | INTRAVENOUS | Status: AC

## 2021-07-31 MED ORDER — PROPOFOL 10 MG/ML IV BOLUS
INTRAVENOUS | Status: DC | PRN
  Administered 2021-07-31: 110 mg via INTRAVENOUS

## 2021-07-31 MED ORDER — SODIUM CHLORIDE 0.9% FLUSH
3.0000 mL | Freq: Two times a day (BID) | INTRAVENOUS | Status: DC
  Administered 2021-07-31 (×2): 3 mL via INTRAVENOUS

## 2021-07-31 MED ORDER — ORAL CARE MOUTH RINSE: 15.0000 mL | Freq: Once | OROMUCOSAL | Status: AC

## 2021-07-31 MED ORDER — SUFENTANIL CITRATE 50 MCG/ML IV SOLN
INTRAVENOUS | Status: DC | PRN
Start: 2021-07-31 — End: 2021-07-31
  Administered 2021-07-31: 15 ug via INTRAVENOUS

## 2021-07-31 MED ORDER — VANCOMYCIN HCL IN DEXTROSE 1-5 GM/200ML-% IV SOLN
INTRAVENOUS | Status: AC
  Administered 2021-07-31: 1000 mg via INTRAVENOUS
  Filled 2021-07-31: qty 200

## 2021-07-31 MED ORDER — ONDANSETRON HCL 4 MG/2ML IJ SOLN
INTRAMUSCULAR | Status: DC | PRN
  Administered 2021-07-31: 4 mg via INTRAVENOUS

## 2021-07-31 MED ORDER — HYDROCHLOROTHIAZIDE 12.5 MG PO TABS
6.2500 mg | ORAL_TABLET | Freq: Every day | ORAL | Status: DC
  Administered 2021-08-01: 6.25 mg via ORAL
  Filled 2021-07-31: qty 1

## 2021-07-31 MED ORDER — PHENOL 1.4 % MT LIQD: 1.0000 | OROMUCOSAL | Status: DC | PRN

## 2021-07-31 MED ORDER — OXYMETAZOLINE HCL 0.05 % NA SOLN
1.0000 | Freq: Every day | NASAL | Status: DC
  Administered 2021-07-31: 1 via NASAL
  Filled 2021-07-31: qty 30

## 2021-07-31 MED ORDER — AMISULPRIDE (ANTIEMETIC) 5 MG/2ML IV SOLN
10.0000 mg | Freq: Once | INTRAVENOUS | Status: AC
  Administered 2021-07-31: 10 mg via INTRAVENOUS

## 2021-07-31 MED ORDER — SODIUM CHLORIDE 0.9% FLUSH: 3.0000 mL | INTRAVENOUS | Status: DC | PRN

## 2021-07-31 MED ORDER — CHLORHEXIDINE GLUCONATE CLOTH 2 % EX PADS: 6.0000 | MEDICATED_PAD | Freq: Once | CUTANEOUS | Status: DC

## 2021-07-31 MED ORDER — FAMOTIDINE 20 MG PO TABS: 20.0000 mg | ORAL_TABLET | Freq: Every day | ORAL | Status: DC | PRN

## 2021-07-31 MED ORDER — SODIUM CHLORIDE (PF) 0.9 % IJ SOLN
INTRAMUSCULAR | Status: AC
  Filled 2021-07-31: qty 10

## 2021-07-31 MED ORDER — PHENYLEPHRINE 80 MCG/ML (10ML) SYRINGE FOR IV PUSH (FOR BLOOD PRESSURE SUPPORT)
PREFILLED_SYRINGE | INTRAVENOUS | Status: AC
  Filled 2021-07-31: qty 10

## 2021-07-31 MED ORDER — BUPIVACAINE-EPINEPHRINE (PF) 0.5% -1:200000 IJ SOLN
INTRAMUSCULAR | Status: DC | PRN
  Administered 2021-07-31: 10 mL

## 2021-07-31 MED ORDER — LIDOCAINE 2% (20 MG/ML) 5 ML SYRINGE
INTRAMUSCULAR | Status: AC
  Filled 2021-07-31: qty 5

## 2021-07-31 SURGICAL SUPPLY — 55 items
BAG COUNTER SPONGE SURGICOUNT (BAG) ×2 IMPLANT
BASKET BONE COLLECTION (BASKET) ×2 IMPLANT
BENZOIN TINCTURE PRP APPL 2/3 (GAUZE/BANDAGES/DRESSINGS) ×2 IMPLANT
BUR MATCHSTICK NEURO 3.0 LAGG (BURR) ×2 IMPLANT
BUR PRECISION FLUTE 6.0 (BURR) ×2 IMPLANT
CANISTER SUCT 3000ML PPV (MISCELLANEOUS) ×2 IMPLANT
CAP LOCK DLX THRD (Cap) ×6 IMPLANT
CARTRIDGE OIL MAESTRO DRILL (MISCELLANEOUS) ×1 IMPLANT
CNTNR URN SCR LID CUP LEK RST (MISCELLANEOUS) ×1 IMPLANT
CONT SPEC 4OZ STRL OR WHT (MISCELLANEOUS) ×1
COVER BACK TABLE 60X90IN (DRAPES) ×2 IMPLANT
DIFFUSER DRILL AIR PNEUMATIC (MISCELLANEOUS) ×2 IMPLANT
DRAPE C-ARM 42X72 X-RAY (DRAPES) ×4 IMPLANT
DRAPE HALF SHEET 40X57 (DRAPES) ×2 IMPLANT
DRAPE LAPAROTOMY 100X72X124 (DRAPES) ×2 IMPLANT
DRSG OPSITE POSTOP 4X10 (GAUZE/BANDAGES/DRESSINGS) ×1 IMPLANT
DRSG OPSITE POSTOP 4X6 (GAUZE/BANDAGES/DRESSINGS) ×1 IMPLANT
ELECT BLADE 4.0 EZ CLEAN MEGAD (MISCELLANEOUS) ×2
ELECT REM PT RETURN 9FT ADLT (ELECTROSURGICAL) ×2
ELECTRODE BLDE 4.0 EZ CLN MEGD (MISCELLANEOUS) ×1 IMPLANT
ELECTRODE REM PT RTRN 9FT ADLT (ELECTROSURGICAL) ×1 IMPLANT
EVACUATOR 1/8 PVC DRAIN (DRAIN) IMPLANT
GAUZE 4X4 16PLY ~~LOC~~+RFID DBL (SPONGE) ×1 IMPLANT
GLOVE BIO SURGEON STRL SZ8 (GLOVE) ×4 IMPLANT
GLOVE BIO SURGEON STRL SZ8.5 (GLOVE) ×4 IMPLANT
GOWN STRL REUS W/ TWL LRG LVL3 (GOWN DISPOSABLE) IMPLANT
GOWN STRL REUS W/ TWL XL LVL3 (GOWN DISPOSABLE) ×2 IMPLANT
GOWN STRL REUS W/TWL LRG LVL3 (GOWN DISPOSABLE) ×2
GOWN STRL REUS W/TWL XL LVL3 (GOWN DISPOSABLE) ×3
GRAFT TRINITY ELITE LGE HUMAN (Tissue) ×2 IMPLANT
HEMOSTAT POWDER KIT SURGIFOAM (HEMOSTASIS) ×3 IMPLANT
KIT BASIN OR (CUSTOM PROCEDURE TRAY) ×2 IMPLANT
KIT GRAFTMAG DEL NEURO DISP (NEUROSURGERY SUPPLIES) IMPLANT
KIT TURNOVER KIT B (KITS) ×2 IMPLANT
MILL BONE PREP (MISCELLANEOUS) ×1 IMPLANT
NDL HYPO 21X1.5 SAFETY (NEEDLE) IMPLANT
NEEDLE HYPO 21X1.5 SAFETY (NEEDLE) ×2 IMPLANT
NEEDLE HYPO 22GX1.5 SAFETY (NEEDLE) ×2 IMPLANT
NS IRRIG 1000ML POUR BTL (IV SOLUTION) ×3 IMPLANT
OIL CARTRIDGE MAESTRO DRILL (MISCELLANEOUS) ×2
PACK LAMINECTOMY NEURO (CUSTOM PROCEDURE TRAY) ×2 IMPLANT
ROD CURVED TI 6.35X80 (Rod) ×2 IMPLANT
SCREW PA DLX CREO 8.5X50 (Screw) ×1 IMPLANT
SCREW PA DLX CREO 9.5X45 (Screw) ×2 IMPLANT
SCREW PA DLX CREO 9.5X50 (Screw) ×4 IMPLANT
SPONGE T-LAP 4X18 ~~LOC~~+RFID (SPONGE) IMPLANT
STRIP CLOSURE SKIN 1/2X4 (GAUZE/BANDAGES/DRESSINGS) ×2 IMPLANT
SUT VIC AB 1 CT1 18XBRD ANBCTR (SUTURE) ×2 IMPLANT
SUT VIC AB 1 CT1 8-18 (SUTURE) ×2
SUT VIC AB 2-0 CP2 18 (SUTURE) ×4 IMPLANT
SYR 20ML LL LF (SYRINGE) ×1 IMPLANT
TOWEL GREEN STERILE (TOWEL DISPOSABLE) ×2 IMPLANT
TOWEL GREEN STERILE FF (TOWEL DISPOSABLE) ×2 IMPLANT
TRAY FOLEY MTR SLVR 16FR STAT (SET/KITS/TRAYS/PACK) ×2 IMPLANT
WATER STERILE IRR 1000ML POUR (IV SOLUTION) ×2 IMPLANT

## 2021-07-31 NOTE — Op Note (Signed)
Brief history: The patient is a 76 year old white female on whom I performed an L3-4 and L4-5 decompression, instrumentation and fusion about a year ago.  She initially seemed to be doing well but developed recurrent and worsening back pain.  She was worked up with x-rays and CT scan which demonstrated findings consistent with a pseudoarthrosis.  I discussed the various treatment options with her.  She has decided proceed with surgery.  Preoperative diagnosis: Lumbar pseudoarthrosis, lumbago  Postoperative diagnosis: The same  Procedure: Exploration of lumbar fusion/removal of lumbar hardware; posterior lateral arthrodesis L3-4 and L4-5 with local morselized autograft bone and Trinity bone graft extender; posterior segmental instrumentation L3-L5 bilaterally with globus titanium pedicle screws and rods  Surgeon: Dr. Earle Gell  Asst.: Arnetha Massy, NP  Anesthesia: Gen. endotracheal  Estimated blood loss: 100 cc  Drains: None  Complications: None  Description of procedure: The patient was brought to the operating room by the anesthesia team. General endotracheal anesthesia was induced. The patient was turned to the prone position on the Wilson frame. The patient's lumbosacral region was then prepared with Betadine scrub and Betadine solution. Sterile drapes were applied.  I then injected the area to be incised with Marcaine with epinephrine solution. I then used the scalpel to make a linear midline incision over the L3-4 and L4-5 interspace, incising through the old surgical scar. I then used electrocautery to perform a bilateral subperiosteal dissection exposing the spinous process and lamina of L3-4 and L4-5 and exposing the old hardware at L3-4 and L4-5. We then inserted the Verstrac retractor to provide exposure.  We noted that the L4 spinous process was fractured and loose.  We removed it with a Leksell rongeur and later use it for local autograft bone.  We explored the fusion by  removing the caps from the old screws and then removing the rods.  The screws were loose bilaterally at L3, L4 and L5.  We remove the loose screws.  We now turned attention to the instrumentation. Under fluoroscopic guidance we placed a loose screws at L3, L4 and L5 bilaterally with 9.5 x 45 and 50 mm pedicle screws using the old holes.  We then connected the unilateral pedicle screws with a lordotic rod.  We then tightened the caps appropriately. This completed the instrumentation from L3-L5 bilaterally.  We now turned our attention to the redo posterior lateral arthrodesis.  We used electrocautery to remove the soft tissue from the remainder of the facets and transverse processes bilaterally at L3-4 and L4-5.  We then used a high-speed drill to decorticate the remainder of the facets and transverse processes bilaterally at L3-4 and L4-5.  We laid a combination of local morselized autograft bone that we obtained on the removal of the L4 spinous process and Trinity bone graft extender over these decorticated posterior structures completing the posterior arthrodesis at L3-4 and L4-5.   We then obtained hemostasis using bipolar electrocautery. We irrigated the wound out with saline solution . We then removed the retractor.  We injected Exparel . We reapproximated patient's thoracolumbar fascia with interrupted #1 Vicryl suture. We reapproximated patient's subcutaneous tissue with interrupted 2-0 Vicryl suture. The reapproximated patient's skin with Steri-Strips and benzoin. The wound was then coated with bacitracin ointment. A sterile dressing was applied. The drapes were removed. The patient was subsequently returned to the supine position where they were extubated by the anesthesia team. He was then transported to the post anesthesia care unit in stable condition. All sponge instrument and  needle counts were reportedly correct at the end of this case.

## 2021-07-31 NOTE — H&P (Signed)
Subjective: The patient is a 76 year old white female on whom I performed an L3-4 and L4-5 decompression, instrumentation and fusion on 11/25/2020.  The patient initially seemed to be improving well but has developed recurrent back pain.  She was worked up with x-rays and a CT scan which demonstrated findings consistent with a pseudoarthrosis.  I discussed the various treatment options with her.  She has decided proceed with surgery.  Past Medical History:  Diagnosis Date   Anxiety    Arthritis    GERD (gastroesophageal reflux disease)    Hypertension    PONV (postoperative nausea and vomiting)     Past Surgical History:  Procedure Laterality Date   BACK SURGERY     11/25/2020   CHOLECYSTECTOMY     EYE SURGERY     FRACTURE SURGERY Left    left wrist   TUBAL LIGATION      Allergies  Allergen Reactions   Sulfa Antibiotics Nausea And Vomiting and Other (See Comments)    hypokalemia SEVERE hypokalemia    Codeine Nausea And Vomiting   Morphine Nausea And Vomiting   Meperidine Hives   Tramadol Hcl Itching    Social History   Tobacco Use   Smoking status: Never   Smokeless tobacco: Never  Substance Use Topics   Alcohol use: Never    History reviewed. No pertinent family history. Prior to Admission medications   Medication Sig Start Date End Date Taking? Authorizing Provider  acetaminophen (TYLENOL) 500 MG tablet Take 1,000 mg by mouth every 6 (six) hours as needed for moderate pain.   Yes [provider]  ALPRAZolam [name] 0.5 MG tablet Take 0.5 mg by mouth 2 (two) times daily as needed for anxiety. 07/09/20  Yes [provider]  aspirin EC 81 MG tablet Take 81 mg by mouth in the morning. Swallow whole.   Yes [provider]  bisoprolol-hydrochlorothiazide (ZIAC) 5-6.25 MG tablet Take 1 tablet by mouth in the morning. 09/21/20  Yes [provider]  Calcium Carb-Cholecalciferol (CALCIUM 600+D3 PO) Take 1 tablet by mouth in the morning.   Yes  [provider]  famotidine (PEPCID) 20 MG tablet Take 20 mg by mouth daily as needed for heartburn or indigestion.   Yes [provider]  Multiple Vitamin (MULTIVITAMIN WITH MINERALS) TABS tablet Take 1 tablet by mouth in the morning.   Yes [provider]  Omega-3 Fatty Acids (FISH OIL) 1000 MG CAPS Take 1,000 mg by mouth in the morning.   Yes [provider]  oxymetazoline (AFRIN) 0.05 % nasal spray Place 1 spray into both nostrils at bedtime.   Yes [provider]  prednisoLONE acetate (PRED FORTE) 1 % ophthalmic suspension Place 1 drop into the left eye every 30 (thirty) days.   Yes [provider]  Turmeric 500 MG CAPS Take 500 mg by mouth daily.   Yes [provider]  vitamin B-12 (CYANOCOBALAMIN) 500 MCG tablet Take 500 mcg by mouth daily.   Yes [provider]  cyclobenzaprine (FLEXERIL) 5 MG tablet Take 1 tablet (5 mg total) by mouth 3 (three) times daily as needed for muscle spasms. Patient not taking: Reported on 07/24/2021 11/27/20   Newman Pies, MD  docusate sodium (COLACE) 100 MG capsule Take 1 capsule (100 mg total) by mouth 2 (two) times daily. Patient not taking: Reported on 07/24/2021 11/27/20   Newman Pies, MD  oxyCODONE-acetaminophen (PERCOCET/ROXICET) 5-325 MG tablet Take 1-2 tablets by mouth every 4 (four) hours as needed for moderate  pain. Patient not taking: Reported on 07/24/2021 11/27/20   Newman Pies, MD     Review of Systems  Positive ROS: As above  All other systems have been reviewed and were otherwise negative with the exception of those mentioned in the HPI and as above.  Objective: Vital signs in last 24 hours: Temp:  [98.2 F (36.8 C)] 98.2 F (36.8 C) (05/18 0554) Pulse Rate:  [61] 61 (05/18 0554) Resp:  [18] 18 (05/18 0554) BP: (155)/(72) 155/72 (05/18 0554) SpO2:  [97 %] 97 % (05/18 0554) Weight:  [73.9 kg] 73.9 kg (05/18 0554) Estimated body mass index is 23.39 kg/m  as calculated from the following:   Height as of this encounter: '5\' 10"'$  (1.778 m).   Weight as of this encounter: 73.9 kg.   General Appearance: Alert Head: Normocephalic, without obvious abnormality, atraumatic Eyes: PERRL, conjunctiva/corneas clear, EOM's intact,    Ears: Normal  Throat: Normal  Neck: Supple, Back: The patient's lumbar incision is well-healed. Lungs: Clear to auscultation bilaterally, respirations unlabored Heart: Regular rate and rhythm, no murmur, rub or gallop Abdomen: Soft, non-tender Extremities: Extremities normal, atraumatic, no cyanosis or edema Skin: unremarkable  NEUROLOGIC:   Mental status: alert and oriented,Motor Exam - grossly normal Sensory Exam - grossly normal Reflexes:  Coordination - grossly normal Gait - grossly normal Balance - grossly normal Cranial Nerves: I: smell Not tested  II: visual acuity  OS: Normal  OD: Normal   II: visual fields Full to confrontation  II: pupils Equal, round, reactive to light  III,VII: ptosis None  III,IV,VI: extraocular muscles  Full ROM  V: mastication Normal  V: facial light touch sensation  Normal  V,VII: corneal reflex  Present  VII: facial muscle function - upper  Normal  VII: facial muscle function - lower Normal  VIII: hearing Not tested  IX: soft palate elevation  Normal  IX,X: gag reflex Present  XI: trapezius strength  5/5  XI: sternocleidomastoid strength 5/5  XI: neck flexion strength  5/5  XII: tongue strength  Normal    Data Review Lab Results  Component Value Date   WBC 7.1 07/28/2021   HGB 13.0 07/28/2021   HCT 39.7 07/28/2021   MCV 87.8 07/28/2021   PLT 202 07/28/2021   Lab Results  Component Value Date   NA 139 07/28/2021   K 4.2 07/28/2021   CL 106 07/28/2021   CO2 25 07/28/2021   BUN 15 07/28/2021   CREATININE 0.90 07/28/2021   GLUCOSE 80 07/28/2021   No results found for: INR, PROTIME  Assessment/Plan: Lumbar pseudoarthrosis, lumbago: I have discussed  situation with the patient.  I reviewed her imaging studies with her and pointed out the abnormalities.  We have discussed the various treatment options including surgery.  I described surgical treatment option of a revision of her L3-4 and L4-5 instrumentation and fusion.  I have shown her surgical models.  We have discussed the risk, benefits, alternatives, expected postop course, and likelihood of achieving our goals with surgery.  I have answered all her questions.  She has decided proceed with surgery.   [name] 07/31/2021 7:17 AM

## 2021-07-31 NOTE — Progress Notes (Signed)
Orthopedic Tech Progress Note Patient Details:  Ann Diaz [birth year] [id number]  Patient ID: [name], female   DOB: [birth year], 76 y.o.   MRN: [id number] Patient already has their brace according to RN. Karolee Stamps 07/31/2021, 12:02 PM

## 2021-07-31 NOTE — Transfer of Care (Signed)
Immediate Anesthesia Transfer of Care Note  Patient: Ann Diaz  Procedure(s) Performed: EXPLORATION OF POSTERIOR LUMBAR FUSION WITH REMOVAL AND REPLACEMENT OF HARDWARE LUMBAR THREE-FOUR, LUMBAR FOUR-FIVE (Spine Lumbar)  Patient Location: PACU  Anesthesia Type:General  Level of Consciousness: drowsy and patient cooperative  Airway & Oxygen Therapy: Patient Spontanous Breathing and Patient connected to nasal cannula oxygen  Post-op Assessment: Report given to RN, Post -op Vital signs reviewed and stable and Patient moving all extremities  Post vital signs: Reviewed and stable  Last Vitals:  Vitals Value Taken Time  BP 150/70 07/31/21 1021  Temp    Pulse 69 07/31/21 1022  Resp 20 07/31/21 1022  SpO2 100 % 07/31/21 1022  Vitals shown include unvalidated device data.  Last Pain:  Vitals:   07/31/21 0618  TempSrc:   PainSc: 0-No pain         Complications: No notable events documented.

## 2021-07-31 NOTE — Anesthesia Postprocedure Evaluation (Signed)
Anesthesia Post Note  Patient: Jasmia Angst Maroney  Procedure(s) Performed: EXPLORATION OF POSTERIOR LUMBAR FUSION WITH REMOVAL AND REPLACEMENT OF HARDWARE LUMBAR THREE-FOUR, LUMBAR FOUR-FIVE (Spine Lumbar)     Patient location during evaluation: PACU Anesthesia Type: General Level of consciousness: awake and alert Pain management: pain level controlled Vital Signs Assessment: post-procedure vital signs reviewed and stable Respiratory status: spontaneous breathing, nonlabored ventilation and respiratory function stable Cardiovascular status: blood pressure returned to baseline and stable Postop Assessment: no apparent nausea or vomiting Anesthetic complications: no   No notable events documented.  Last Vitals:  Vitals:   07/31/21 1120 07/31/21 1146  BP: (!) 161/73 (!) 162/68  Pulse: 63 (!) 56  Resp: 13 18  Temp: 36.4 C 36.6 C  SpO2: 96% 98%    Last Pain:  Vitals:   07/31/21 1146  TempSrc:   PainSc: 3                  Suzie Vandam,W. EDMOND

## 2021-07-31 NOTE — Anesthesia Procedure Notes (Signed)
Procedure Name: Intubation Date/Time: 07/31/2021 7:35 AM Performed by: Moshe Salisbury, CRNA Pre-anesthesia Checklist: Patient identified, Emergency Drugs available, Suction available and Patient being monitored Patient Re-evaluated:Patient Re-evaluated prior to induction Oxygen Delivery Method: Circle System Utilized Preoxygenation: Pre-oxygenation with 100% oxygen Induction Type: IV induction Ventilation: Mask ventilation without difficulty Laryngoscope Size: Mac and 3 Grade View: Grade II Tube type: Oral Tube size: 7.5 mm Number of attempts: 1 Airway Equipment and Method: Stylet Placement Confirmation: ETT inserted through vocal cords under direct vision, positive ETCO2 and breath sounds checked- equal and bilateral Secured at: 22 cm Tube secured with: Tape Dental Injury: Teeth and Oropharynx as per pre-operative assessment

## 2021-08-01 MED ORDER — OXYCODONE-ACETAMINOPHEN 5-325 MG PO TABS: 1.0000 | ORAL_TABLET | ORAL | 0 refills | Status: DC | PRN

## 2021-08-01 MED ORDER — CYCLOBENZAPRINE HCL 5 MG PO TABS: 5.0000 mg | ORAL_TABLET | Freq: Three times a day (TID) | ORAL | 1 refills | Status: AC | PRN

## 2021-08-01 MED ORDER — DOCUSATE SODIUM 100 MG PO CAPS: 100.0000 mg | ORAL_CAPSULE | Freq: Two times a day (BID) | ORAL | 0 refills | Status: DC

## 2021-08-01 MED ORDER — OXYCODONE-ACETAMINOPHEN 5-325 MG PO TABS: 1.0000 | ORAL_TABLET | ORAL | Status: DC | PRN

## 2021-08-01 MED FILL — Thrombin For Soln 5000 Unit: CUTANEOUS | Qty: 5000 | Status: AC

## 2021-08-01 NOTE — Evaluation (Signed)
Occupational Therapy Evaluation and Discharge Patient Details Name: Ann Diaz MRN: [id number] DOB: 06-May-1945 Today's Date: 08/01/2021   History of Present Illness 76 y/o female admitted 07/31/21 following PLIF L3-5. PMH: HTN, hx of back surgery   Clinical Impression   Pt is functioning at up to a supervision level in ADLs and mobility. Educated in back precautions related to ADLs and IADLs to avoid. Pt receptive and verbalized understanding. Pt to return home with supportive husband. No further OT needs.      Recommendations for follow up therapy are one component of a multi-disciplinary discharge planning process, led by the attending physician.  Recommendations may be updated based on patient status, additional functional criteria and insurance authorization.   Follow Up Recommendations  No OT follow up    Assistance Recommended at Discharge Intermittent Supervision/Assistance  Patient can return home with the following A little help with bathing/dressing/bathroom;Assistance with cooking/housework;Assist for transportation;Help with stairs or ramp for entrance    Functional Status Assessment  Patient has had a recent decline in their functional status and demonstrates the ability to make significant improvements in function in a reasonable and predictable amount of time.  Equipment Recommendations  None recommended by OT    Recommendations for Other Services       Precautions / Restrictions Precautions Precautions: Back Precaution Booklet Issued: Yes (comment) Precaution Comments: reinforcement of back precautions related to ADLs and IADLs Required Braces or Orthoses: Spinal Brace Spinal Brace: Lumbar corset;Applied in standing position Restrictions Weight Bearing Restrictions: No      Mobility Bed Mobility                    Transfers Overall transfer level: Needs assistance Equipment used: None Transfers: Sit to/from Stand Sit to Stand: Supervision            General transfer comment: supervision for safety      Balance                                           ADL either performed or assessed with clinical judgement   ADL Overall ADL's : Needs assistance/impaired Eating/Feeding: Independent   Grooming: Modified independent;Standing Grooming Details (indicate cue type and reason): educated in 2 cup method for tooth brushing and washing face with washcloth Upper Body Bathing: Set up;Standing Upper Body Bathing Details (indicate cue type and reason): recommended long handled bath sponge for back Lower Body Bathing: Modified independent;Sit to/from stand Lower Body Bathing Details (indicate cue type and reason): recommended long handled bath sponge and use of reacher to wash and dry feet Upper Body Dressing : Set up;Sitting   Lower Body Dressing: Modified independent;Sit to/from stand;Sitting/lateral leans Lower Body Dressing Details (indicate cue type and reason): can perform figure 4 to don socks Toilet Transfer: Supervision/safety   Toileting- Clothing Manipulation and Hygiene: Modified independent Toileting - Clothing Manipulation Details (indicate cue type and reason): educated pt to avoid twisting with pericare     Functional mobility during ADLs: Supervision/safety General ADL Comments: Instructed pt and husband in IADLs to avoid.     Vision Ability to See in Adequate Light: 0 Adequate       Perception     Praxis      Pertinent Vitals/Pain Pain Assessment Pain Assessment: Faces Faces Pain Scale: Hurts a little bit Pain Location: incision Pain Descriptors / Indicators: Discomfort, Grimacing Pain Intervention(s):  Premedicated before session     Hand Dominance     Extremity/Trunk Assessment Upper Extremity Assessment Upper Extremity Assessment: Overall WFL for tasks assessed   Lower Extremity Assessment Lower Extremity Assessment: Defer to PT evaluation   Cervical / Trunk  Assessment Cervical / Trunk Assessment: Back Surgery   Communication Communication Communication: No difficulties   Cognition Arousal/Alertness: Awake/alert Behavior During Therapy: WFL for tasks assessed/performed Overall Cognitive Status: Within Functional Limits for tasks assessed                                       General Comments       Exercises     Shoulder Instructions      Home Living Family/patient expects to be discharged to:: Private residence Living Arrangements: Spouse/significant other Available Help at Discharge: Family;Available 24 hours/day Type of Home: House Home Access: Stairs to enter [employer] of Steps: 1 Entrance Stairs-Rails: None Home Layout: Two level;Able to live on main level with bedroom/bathroom Alternate Level Stairs-Number of Steps: 6 Alternate Level Stairs-Rails: Right Bathroom Shower/Tub: Walk-in shower   Bathroom Toilet: Handicapped height     Home Equipment: Conservation officer, nature (2 wheels);BSC/3in1;Shower seat - built in;Grab bars - tub/shower;Adaptive equipment;Wheelchair - Diplomatic Services operational officer Equipment: Reacher;Long-handled sponge        Prior Functioning/Environment Prior Level of Function : Independent/Modified Independent                        OT Problem List:        OT Treatment/Interventions:      OT Goals(Current goals can be found in the care plan section)    OT Frequency:      Co-evaluation              AM-PAC OT "6 Clicks" Daily Activity     Outcome Measure Help from another person eating meals?: None Help from another person taking care of personal grooming?: None Help from another person toileting, which includes using toliet, bedpan, or urinal?: None Help from another person bathing (including washing, rinsing, drying)?: None Help from another person to put on and taking off regular upper body clothing?: None Help from another person to put on and taking off regular  lower body clothing?: None 6 Click Score: 24   End of Session Nurse Communication: Other (comment) (ready for discharge)  Activity Tolerance: Patient tolerated treatment well Patient left: in bed;with call bell/phone within reach;with family/visitor present (seated EOB)  OT Visit Diagnosis: Pain                Time: 2094-7096 OT Time Calculation (min): 13 min Charges:  OT General Charges $OT Visit: 1 Visit OT Evaluation $OT Eval Low Complexity: 1 Low  Nestor Lewandowsky, OTR/L Acute Rehabilitation Services Pager: [phone] Office: [phone]  Malka So 08/01/2021, 10:03 AM

## 2021-08-01 NOTE — Progress Notes (Signed)
Patient awaiting to be transported to her vehicle for discharge home; in no acute distress nor complaints of pain nor discomfort; moves all extremities well; incision on her back with honeycomb dressing and is clean, dry and intact; room was checked for all her belongings; discharge instructions concerning her medications, incision care, follow up appointment and when to call the doctor as needed were all discussed with patient and her husband and both expressed understanding on the instructions given.

## 2021-08-01 NOTE — Discharge Summary (Signed)
Physician Discharge Summary  Patient ID: Ann Diaz MRN: [id number] DOB/AGE: [birth year] 74 y.o.  Admit date: 07/31/2021 Discharge date: 08/01/2021  Admission Diagnoses: Lumbar pseudoarthrosis, lumbago  Discharge Diagnoses: The same Principal Problem:   Lumbar pseudoarthrosis   Discharged Condition: good  Hospital Course: I performed a revision of the patient's lumbar fusion on 07/31/2021.  The surgery went well.  The patient's postoperative course was unremarkable.  On postoperative day #1 she requested discharge home.  She was given verbal and written discharge instructions.  All her questions were answered.  Consults: PT, OT, care management Significant Diagnostic Studies: None Treatments: Exploration of lumbar fusion, L3-4 and L4-5 redo posterior instrumentation and fusion Discharge Exam: Blood pressure 136/68, pulse 62, temperature 97.9 F (36.6 C), temperature source Oral, resp. rate 16, height '5\' 10"'$  (1.778 m), weight 73.9 kg, SpO2 100 %. The patient is alert and pleasant.  She looks well.  Her strength is normal.  Disposition: Home  Discharge Instructions     Call MD for:  difficulty breathing, headache or visual disturbances   Complete by: As directed    Call MD for:  extreme fatigue   Complete by: As directed    Call MD for:  hives   Complete by: As directed    Call MD for:  persistant dizziness or light-headedness   Complete by: As directed    Call MD for:  persistant nausea and vomiting   Complete by: As directed    Call MD for:  redness, tenderness, or signs of infection (pain, swelling, redness, odor or green/yellow discharge around incision site)   Complete by: As directed    Call MD for:  severe uncontrolled pain   Complete by: As directed    Call MD for:  temperature >100.4   Complete by: As directed    Diet - low sodium heart healthy   Complete by: As directed    Discharge instructions   Complete by: As directed    Call [phone] for a  followup appointment. Take a stool softener while you are using pain medications.   Driving Restrictions   Complete by: As directed    Do not drive for 2 weeks.   Increase activity slowly   Complete by: As directed    Lifting restrictions   Complete by: As directed    Do not lift more than 5 pounds. No excessive bending or twisting.   May shower / Bathe   Complete by: As directed    Remove the dressing for 3 days after surgery.  You may shower, but leave the incision alone.   Remove dressing in 48 hours   Complete by: As directed       Allergies as of 08/01/2021       Reactions   Sulfa Antibiotics Nausea And Vomiting, Other (See Comments)   hypokalemia SEVERE hypokalemia   Codeine Nausea And Vomiting   Morphine Nausea And Vomiting   Meperidine Hives   Tramadol Hcl Itching        Medication List     STOP taking these medications    acetaminophen 500 MG tablet Commonly known as: TYLENOL       TAKE these medications    ALPRAZolam 0.5 MG tablet Commonly known as: XANAX Take 0.5 mg by mouth 2 (two) times daily as needed for anxiety.   aspirin EC 81 MG tablet Take 81 mg by mouth in the morning. Swallow whole.   bisoprolol-hydrochlorothiazide 5-6.25 MG tablet Commonly known as: ZIAC Take 1  tablet by mouth in the morning.   CALCIUM 600+D3 PO Take 1 tablet by mouth in the morning.   cyclobenzaprine 5 MG tablet Commonly known as: FLEXERIL Take 1 tablet (5 mg total) by mouth 3 (three) times daily as needed for muscle spasms.   docusate sodium 100 MG capsule Commonly known as: COLACE Take 1 capsule (100 mg total) by mouth 2 (two) times daily.   famotidine 20 MG tablet Commonly known as: PEPCID Take 20 mg by mouth daily as needed for heartburn or indigestion.   Fish Oil 1000 MG Caps Take 1,000 mg by mouth in the morning.   multivitamin with minerals Tabs tablet Take 1 tablet by mouth in the morning.   oxyCODONE-acetaminophen 5-325 MG tablet Commonly  known as: PERCOCET/ROXICET Take 1-2 tablets by mouth every 4 (four) hours as needed for moderate pain.   oxymetazoline 0.05 % nasal spray Commonly known as: AFRIN Place 1 spray into both nostrils at bedtime.   prednisoLONE acetate 1 % ophthalmic suspension Commonly known as: PRED FORTE Place 1 drop into the left eye every 30 (thirty) days.   Turmeric 500 MG Caps Take 500 mg by mouth daily.   vitamin B-12 500 MCG tablet Commonly known as: CYANOCOBALAMIN Take 500 mcg by mouth daily.         Signed: [name] 08/01/2021, 7:40 AM

## 2021-08-01 NOTE — Evaluation (Signed)
Physical Therapy Evaluation & Discharge Patient Details Name: Ann Diaz MRN: [id number] DOB: [birth year] Today's Date: 08/01/2021  History of Present Illness  76 y/o female admitted 07/31/21 following PLIF L3-5. PMH: HTN, hx of back surgery  Clinical Impression  Patient admitted following above procedure. Patient functioning at supervision level with no AD and able to safely negotiate stairs to access home environment as she lives in a split level home. Educated patient back precautions, brace wear, and activity progression, patient verbalized understanding, however requires reinforcement to maintain during mobility. No further skilled PT needs identified acutely. No PT follow up recommended at this time.        Recommendations for follow up therapy are one component of a multi-disciplinary discharge planning process, led by the attending physician.  Recommendations may be updated based on patient status, additional functional criteria and insurance authorization.  Follow Up Recommendations No PT follow up    Assistance Recommended at Discharge Intermittent Supervision/Assistance  Patient can return home with the following       Equipment Recommendations None recommended by PT  Recommendations for Other Services       Functional Status Assessment Patient has had a recent decline in their functional status and demonstrates the ability to make significant improvements in function in a reasonable and predictable amount of time.     Precautions / Restrictions Precautions Precautions: Back Precaution Booklet Issued: Yes (comment) Precaution Comments: reinforcement of back precautions required Required Braces or Orthoses: Spinal Brace Spinal Brace: Lumbar corset;Applied in standing position Restrictions Weight Bearing Restrictions: No      Mobility  Bed Mobility Overal bed mobility: Needs Assistance Bed Mobility: Rolling, Sidelying to Sit, Sit to Sidelying Rolling:  Supervision Sidelying to sit: Supervision     Sit to sidelying: Supervision General bed mobility comments: supervision as patient requires cues for maintaining back precautions during bed mobility    Transfers Overall transfer level: Needs assistance   Transfers: Sit to/from Stand Sit to Stand: Supervision           General transfer comment: supervision for safety    Ambulation/Gait Ambulation/Gait assistance: Supervision Gait Distance (Feet): 250 Feet Assistive device: None Gait Pattern/deviations: Step-through pattern, Decreased stride length, Antalgic Gait velocity: decreased     General Gait Details: supervision for safety. Ambulated for first time without AD since surgery. Patient with decreased stance time bilaterally and trendelenburg gait pattern  Stairs Stairs: Yes Stairs assistance: Supervision Stair Management: One rail Left, Step to pattern, Forwards Number of Stairs: 6 General stair comments: supervision for safety  Wheelchair Mobility    Modified Rankin (Stroke Patients Only)       Balance Overall balance assessment: Mild deficits observed, not formally tested                                           Pertinent Vitals/Pain Pain Assessment Pain Assessment: Faces Faces Pain Scale: Hurts little more Pain Location: back Pain Descriptors / Indicators: Discomfort, Grimacing Pain Intervention(s): Monitored during session, Repositioned    Home Living Family/patient expects to be discharged to:: Private residence Living Arrangements: Spouse/significant other Available Help at Discharge: Family Type of Home: House Home Access: Stairs to enter Entrance Stairs-Rails: None Entrance Stairs-Number of Steps: 1 Alternate Level Stairs-Number of Steps: 6 Home Layout: Two level;Able to live on main level with bedroom/bathroom Home Equipment: Rolling Willett Lefeber (2 wheels);BSC/3in1;Shower seat - built in;Grab bars -  tub/shower;Adaptive  equipment;Wheelchair - manual      Prior Function Prior Level of Function : Independent/Modified Independent                     Hand Dominance        Extremity/Trunk Assessment   Upper Extremity Assessment Upper Extremity Assessment: Defer to OT evaluation    Lower Extremity Assessment Lower Extremity Assessment: Generalized weakness    Cervical / Trunk Assessment Cervical / Trunk Assessment: Back Surgery  Communication   Communication: No difficulties  Cognition Arousal/Alertness: Awake/alert Behavior During Therapy: WFL for tasks assessed/performed Overall Cognitive Status: Within Functional Limits for tasks assessed                                 General Comments: requires reinforcement of precautions        General Comments      Exercises     Assessment/Plan    PT Assessment Patient does not need any further PT services  PT Problem List         PT Treatment Interventions      PT Goals (Current goals can be found in the Care Plan section)  Acute Rehab PT Goals Patient Stated Goal: to go home PT Goal Formulation: All assessment and education complete, DC therapy    Frequency       Co-evaluation               AM-PAC PT "6 Clicks" Mobility  Outcome Measure Help needed turning from your back to your side while in a flat bed without using bedrails?: None Help needed moving from lying on your back to sitting on the side of a flat bed without using bedrails?: A Little Help needed moving to and from a bed to a chair (including a wheelchair)?: A Little Help needed standing up from a chair using your arms (e.g., wheelchair or bedside chair)?: A Little Help needed to walk in hospital room?: A Little Help needed climbing 3-5 steps with a railing? : A Little 6 Click Score: 19    End of Session Equipment Utilized During Treatment: Back brace Activity Tolerance: Patient tolerated treatment well Patient left: in bed;with call  bell/phone within reach;with family/visitor present (sitting EOB) Nurse Communication: Mobility status PT Visit Diagnosis: Muscle weakness (generalized) (M62.81)    Time: 6389-3734 PT Time Calculation (min) (ACUTE ONLY): 17 min   Charges:   PT Evaluation $PT Eval Moderate Complexity: 1 Mod          Saud Bail A. Gilford Rile PT, DPT Acute Rehabilitation Services Pager [phone] Office [phone]   Linna Hoff 08/01/2021, 9:49 AM

## 2021-08-01 NOTE — Plan of Care (Signed)

## 2021-08-05 DIAGNOSIS — Z981 Arthrodesis status: Secondary | ICD-10-CM | POA: Diagnosis not present

## 2021-08-26 DIAGNOSIS — S32009K Unspecified fracture of unspecified lumbar vertebra, subsequent encounter for fracture with nonunion: Secondary | ICD-10-CM | POA: Diagnosis not present

## 2021-11-04 DIAGNOSIS — H524 Presbyopia: Secondary | ICD-10-CM | POA: Diagnosis not present

## 2021-11-04 DIAGNOSIS — H52223 Regular astigmatism, bilateral: Secondary | ICD-10-CM | POA: Diagnosis not present

## 2021-11-04 DIAGNOSIS — H5212 Myopia, left eye: Secondary | ICD-10-CM | POA: Diagnosis not present

## 2021-11-04 DIAGNOSIS — H18513 Endothelial corneal dystrophy, bilateral: Secondary | ICD-10-CM | POA: Diagnosis not present

## 2021-11-07 DIAGNOSIS — S32009K Unspecified fracture of unspecified lumbar vertebra, subsequent encounter for fracture with nonunion: Secondary | ICD-10-CM | POA: Diagnosis not present

## 2021-11-12 DIAGNOSIS — D485 Neoplasm of uncertain behavior of skin: Secondary | ICD-10-CM | POA: Diagnosis not present

## 2021-12-01 DIAGNOSIS — S32009K Unspecified fracture of unspecified lumbar vertebra, subsequent encounter for fracture with nonunion: Secondary | ICD-10-CM | POA: Diagnosis not present

## 2021-12-01 DIAGNOSIS — M4326 Fusion of spine, lumbar region: Secondary | ICD-10-CM | POA: Diagnosis not present

## 2021-12-01 DIAGNOSIS — M6281 Muscle weakness (generalized): Secondary | ICD-10-CM | POA: Diagnosis not present

## 2021-12-09 DIAGNOSIS — M6281 Muscle weakness (generalized): Secondary | ICD-10-CM | POA: Diagnosis not present

## 2021-12-09 DIAGNOSIS — S32009K Unspecified fracture of unspecified lumbar vertebra, subsequent encounter for fracture with nonunion: Secondary | ICD-10-CM | POA: Diagnosis not present

## 2021-12-09 DIAGNOSIS — M4326 Fusion of spine, lumbar region: Secondary | ICD-10-CM | POA: Diagnosis not present

## 2021-12-10 DIAGNOSIS — M6281 Muscle weakness (generalized): Secondary | ICD-10-CM | POA: Diagnosis not present

## 2021-12-10 DIAGNOSIS — S32009K Unspecified fracture of unspecified lumbar vertebra, subsequent encounter for fracture with nonunion: Secondary | ICD-10-CM | POA: Diagnosis not present

## 2021-12-10 DIAGNOSIS — M4326 Fusion of spine, lumbar region: Secondary | ICD-10-CM | POA: Diagnosis not present

## 2021-12-16 DIAGNOSIS — M4326 Fusion of spine, lumbar region: Secondary | ICD-10-CM | POA: Diagnosis not present

## 2021-12-16 DIAGNOSIS — M6281 Muscle weakness (generalized): Secondary | ICD-10-CM | POA: Diagnosis not present

## 2021-12-16 DIAGNOSIS — S32009K Unspecified fracture of unspecified lumbar vertebra, subsequent encounter for fracture with nonunion: Secondary | ICD-10-CM | POA: Diagnosis not present

## 2021-12-17 DIAGNOSIS — M6281 Muscle weakness (generalized): Secondary | ICD-10-CM | POA: Diagnosis not present

## 2021-12-17 DIAGNOSIS — M4326 Fusion of spine, lumbar region: Secondary | ICD-10-CM | POA: Diagnosis not present

## 2021-12-17 DIAGNOSIS — S32009K Unspecified fracture of unspecified lumbar vertebra, subsequent encounter for fracture with nonunion: Secondary | ICD-10-CM | POA: Diagnosis not present

## 2021-12-18 DIAGNOSIS — D0472 Carcinoma in situ of skin of left lower limb, including hip: Secondary | ICD-10-CM | POA: Diagnosis not present

## 2021-12-23 DIAGNOSIS — S32009K Unspecified fracture of unspecified lumbar vertebra, subsequent encounter for fracture with nonunion: Secondary | ICD-10-CM | POA: Diagnosis not present

## 2021-12-23 DIAGNOSIS — M6281 Muscle weakness (generalized): Secondary | ICD-10-CM | POA: Diagnosis not present

## 2021-12-23 DIAGNOSIS — M4326 Fusion of spine, lumbar region: Secondary | ICD-10-CM | POA: Diagnosis not present

## 2021-12-25 DIAGNOSIS — M6281 Muscle weakness (generalized): Secondary | ICD-10-CM | POA: Diagnosis not present

## 2021-12-25 DIAGNOSIS — S32009K Unspecified fracture of unspecified lumbar vertebra, subsequent encounter for fracture with nonunion: Secondary | ICD-10-CM | POA: Diagnosis not present

## 2021-12-25 DIAGNOSIS — M4326 Fusion of spine, lumbar region: Secondary | ICD-10-CM | POA: Diagnosis not present

## 2021-12-25 DIAGNOSIS — Z23 Encounter for immunization: Secondary | ICD-10-CM | POA: Diagnosis not present

## 2021-12-30 DIAGNOSIS — M6281 Muscle weakness (generalized): Secondary | ICD-10-CM | POA: Diagnosis not present

## 2021-12-30 DIAGNOSIS — S32009K Unspecified fracture of unspecified lumbar vertebra, subsequent encounter for fracture with nonunion: Secondary | ICD-10-CM | POA: Diagnosis not present

## 2021-12-30 DIAGNOSIS — M4326 Fusion of spine, lumbar region: Secondary | ICD-10-CM | POA: Diagnosis not present

## 2022-01-01 DIAGNOSIS — S32009K Unspecified fracture of unspecified lumbar vertebra, subsequent encounter for fracture with nonunion: Secondary | ICD-10-CM | POA: Diagnosis not present

## 2022-01-01 DIAGNOSIS — M6281 Muscle weakness (generalized): Secondary | ICD-10-CM | POA: Diagnosis not present

## 2022-01-01 DIAGNOSIS — M4326 Fusion of spine, lumbar region: Secondary | ICD-10-CM | POA: Diagnosis not present

## 2022-01-06 DIAGNOSIS — M6281 Muscle weakness (generalized): Secondary | ICD-10-CM | POA: Diagnosis not present

## 2022-01-06 DIAGNOSIS — S32009K Unspecified fracture of unspecified lumbar vertebra, subsequent encounter for fracture with nonunion: Secondary | ICD-10-CM | POA: Diagnosis not present

## 2022-01-06 DIAGNOSIS — M4326 Fusion of spine, lumbar region: Secondary | ICD-10-CM | POA: Diagnosis not present

## 2022-01-08 DIAGNOSIS — S32009K Unspecified fracture of unspecified lumbar vertebra, subsequent encounter for fracture with nonunion: Secondary | ICD-10-CM | POA: Diagnosis not present

## 2022-01-08 DIAGNOSIS — M4326 Fusion of spine, lumbar region: Secondary | ICD-10-CM | POA: Diagnosis not present

## 2022-01-08 DIAGNOSIS — M6281 Muscle weakness (generalized): Secondary | ICD-10-CM | POA: Diagnosis not present

## 2022-01-13 DIAGNOSIS — M6281 Muscle weakness (generalized): Secondary | ICD-10-CM | POA: Diagnosis not present

## 2022-01-13 DIAGNOSIS — S32009K Unspecified fracture of unspecified lumbar vertebra, subsequent encounter for fracture with nonunion: Secondary | ICD-10-CM | POA: Diagnosis not present

## 2022-01-13 DIAGNOSIS — M4326 Fusion of spine, lumbar region: Secondary | ICD-10-CM | POA: Diagnosis not present

## 2022-01-20 DIAGNOSIS — S32009K Unspecified fracture of unspecified lumbar vertebra, subsequent encounter for fracture with nonunion: Secondary | ICD-10-CM | POA: Diagnosis not present

## 2022-01-20 DIAGNOSIS — M4326 Fusion of spine, lumbar region: Secondary | ICD-10-CM | POA: Diagnosis not present

## 2022-01-20 DIAGNOSIS — M6281 Muscle weakness (generalized): Secondary | ICD-10-CM | POA: Diagnosis not present

## 2022-01-22 DIAGNOSIS — M4326 Fusion of spine, lumbar region: Secondary | ICD-10-CM | POA: Diagnosis not present

## 2022-01-22 DIAGNOSIS — S32009K Unspecified fracture of unspecified lumbar vertebra, subsequent encounter for fracture with nonunion: Secondary | ICD-10-CM | POA: Diagnosis not present

## 2022-01-22 DIAGNOSIS — M6281 Muscle weakness (generalized): Secondary | ICD-10-CM | POA: Diagnosis not present

## 2022-01-27 DIAGNOSIS — E7800 Pure hypercholesterolemia, unspecified: Secondary | ICD-10-CM | POA: Diagnosis not present

## 2022-01-27 DIAGNOSIS — F410 Panic disorder [episodic paroxysmal anxiety] without agoraphobia: Secondary | ICD-10-CM | POA: Diagnosis not present

## 2022-01-27 DIAGNOSIS — M6281 Muscle weakness (generalized): Secondary | ICD-10-CM | POA: Diagnosis not present

## 2022-01-27 DIAGNOSIS — M4326 Fusion of spine, lumbar region: Secondary | ICD-10-CM | POA: Diagnosis not present

## 2022-01-27 DIAGNOSIS — Z0000 Encounter for general adult medical examination without abnormal findings: Secondary | ICD-10-CM | POA: Diagnosis not present

## 2022-01-27 DIAGNOSIS — Z1331 Encounter for screening for depression: Secondary | ICD-10-CM | POA: Diagnosis not present

## 2022-01-27 DIAGNOSIS — I10 Essential (primary) hypertension: Secondary | ICD-10-CM | POA: Diagnosis not present

## 2022-01-27 DIAGNOSIS — S32009K Unspecified fracture of unspecified lumbar vertebra, subsequent encounter for fracture with nonunion: Secondary | ICD-10-CM | POA: Diagnosis not present

## 2022-01-27 DIAGNOSIS — Z79899 Other long term (current) drug therapy: Secondary | ICD-10-CM | POA: Diagnosis not present

## 2022-01-27 DIAGNOSIS — Z6824 Body mass index (BMI) 24.0-24.9, adult: Secondary | ICD-10-CM | POA: Diagnosis not present

## 2022-01-27 DIAGNOSIS — Z23 Encounter for immunization: Secondary | ICD-10-CM | POA: Diagnosis not present

## 2022-01-27 DIAGNOSIS — K219 Gastro-esophageal reflux disease without esophagitis: Secondary | ICD-10-CM | POA: Diagnosis not present

## 2022-01-29 DIAGNOSIS — M6281 Muscle weakness (generalized): Secondary | ICD-10-CM | POA: Diagnosis not present

## 2022-01-29 DIAGNOSIS — M4326 Fusion of spine, lumbar region: Secondary | ICD-10-CM | POA: Diagnosis not present

## 2022-01-29 DIAGNOSIS — S32009K Unspecified fracture of unspecified lumbar vertebra, subsequent encounter for fracture with nonunion: Secondary | ICD-10-CM | POA: Diagnosis not present

## 2022-02-03 DIAGNOSIS — M6281 Muscle weakness (generalized): Secondary | ICD-10-CM | POA: Diagnosis not present

## 2022-02-03 DIAGNOSIS — S32009K Unspecified fracture of unspecified lumbar vertebra, subsequent encounter for fracture with nonunion: Secondary | ICD-10-CM | POA: Diagnosis not present

## 2022-02-03 DIAGNOSIS — M4326 Fusion of spine, lumbar region: Secondary | ICD-10-CM | POA: Diagnosis not present

## 2022-02-10 DIAGNOSIS — M4326 Fusion of spine, lumbar region: Secondary | ICD-10-CM | POA: Diagnosis not present

## 2022-02-10 DIAGNOSIS — M6281 Muscle weakness (generalized): Secondary | ICD-10-CM | POA: Diagnosis not present

## 2022-02-10 DIAGNOSIS — S32009K Unspecified fracture of unspecified lumbar vertebra, subsequent encounter for fracture with nonunion: Secondary | ICD-10-CM | POA: Diagnosis not present

## 2022-02-17 DIAGNOSIS — M4326 Fusion of spine, lumbar region: Secondary | ICD-10-CM | POA: Diagnosis not present

## 2022-02-17 DIAGNOSIS — M6281 Muscle weakness (generalized): Secondary | ICD-10-CM | POA: Diagnosis not present

## 2022-02-17 DIAGNOSIS — S32009K Unspecified fracture of unspecified lumbar vertebra, subsequent encounter for fracture with nonunion: Secondary | ICD-10-CM | POA: Diagnosis not present

## 2022-02-24 DIAGNOSIS — M4326 Fusion of spine, lumbar region: Secondary | ICD-10-CM | POA: Diagnosis not present

## 2022-02-24 DIAGNOSIS — M6281 Muscle weakness (generalized): Secondary | ICD-10-CM | POA: Diagnosis not present

## 2022-02-24 DIAGNOSIS — S32009K Unspecified fracture of unspecified lumbar vertebra, subsequent encounter for fracture with nonunion: Secondary | ICD-10-CM | POA: Diagnosis not present

## 2022-03-03 DIAGNOSIS — M4326 Fusion of spine, lumbar region: Secondary | ICD-10-CM | POA: Diagnosis not present

## 2022-03-03 DIAGNOSIS — S32009K Unspecified fracture of unspecified lumbar vertebra, subsequent encounter for fracture with nonunion: Secondary | ICD-10-CM | POA: Diagnosis not present

## 2022-03-03 DIAGNOSIS — M6281 Muscle weakness (generalized): Secondary | ICD-10-CM | POA: Diagnosis not present

## 2022-03-26 DIAGNOSIS — J069 Acute upper respiratory infection, unspecified: Secondary | ICD-10-CM | POA: Diagnosis not present

## 2022-04-08 DIAGNOSIS — D0472 Carcinoma in situ of skin of left lower limb, including hip: Secondary | ICD-10-CM | POA: Diagnosis not present

## 2022-04-08 DIAGNOSIS — Z1231 Encounter for screening mammogram for malignant neoplasm of breast: Secondary | ICD-10-CM | POA: Diagnosis not present

## 2022-06-18 DIAGNOSIS — J309 Allergic rhinitis, unspecified: Secondary | ICD-10-CM | POA: Diagnosis not present

## 2022-06-18 DIAGNOSIS — Z6824 Body mass index (BMI) 24.0-24.9, adult: Secondary | ICD-10-CM | POA: Diagnosis not present

## 2022-07-28 DIAGNOSIS — H18513 Endothelial corneal dystrophy, bilateral: Secondary | ICD-10-CM | POA: Diagnosis not present

## 2022-07-28 DIAGNOSIS — H1812 Bullous keratopathy, left eye: Secondary | ICD-10-CM | POA: Diagnosis not present

## 2022-07-28 DIAGNOSIS — Z947 Corneal transplant status: Secondary | ICD-10-CM | POA: Diagnosis not present

## 2022-08-04 DIAGNOSIS — S32009K Unspecified fracture of unspecified lumbar vertebra, subsequent encounter for fracture with nonunion: Secondary | ICD-10-CM | POA: Diagnosis not present

## 2022-12-10 DIAGNOSIS — H524 Presbyopia: Secondary | ICD-10-CM | POA: Diagnosis not present

## 2022-12-25 DIAGNOSIS — Z23 Encounter for immunization: Secondary | ICD-10-CM | POA: Diagnosis not present

## 2023-01-12 DIAGNOSIS — H90A31 Mixed conductive and sensorineural hearing loss, unilateral, right ear with restricted hearing on the contralateral side: Secondary | ICD-10-CM | POA: Diagnosis not present

## 2023-01-12 DIAGNOSIS — H90A22 Sensorineural hearing loss, unilateral, left ear, with restricted hearing on the contralateral side: Secondary | ICD-10-CM | POA: Diagnosis not present

## 2023-01-12 DIAGNOSIS — H903 Sensorineural hearing loss, bilateral: Secondary | ICD-10-CM | POA: Diagnosis not present

## 2023-01-25 DIAGNOSIS — H903 Sensorineural hearing loss, bilateral: Secondary | ICD-10-CM | POA: Diagnosis not present

## 2023-02-01 DIAGNOSIS — Z9181 History of falling: Secondary | ICD-10-CM | POA: Diagnosis not present

## 2023-02-01 DIAGNOSIS — Z6824 Body mass index (BMI) 24.0-24.9, adult: Secondary | ICD-10-CM | POA: Diagnosis not present

## 2023-02-01 DIAGNOSIS — Z79899 Other long term (current) drug therapy: Secondary | ICD-10-CM | POA: Diagnosis not present

## 2023-02-01 DIAGNOSIS — K219 Gastro-esophageal reflux disease without esophagitis: Secondary | ICD-10-CM | POA: Diagnosis not present

## 2023-02-01 DIAGNOSIS — Z1339 Encounter for screening examination for other mental health and behavioral disorders: Secondary | ICD-10-CM | POA: Diagnosis not present

## 2023-02-01 DIAGNOSIS — E7800 Pure hypercholesterolemia, unspecified: Secondary | ICD-10-CM | POA: Diagnosis not present

## 2023-02-01 DIAGNOSIS — I10 Essential (primary) hypertension: Secondary | ICD-10-CM | POA: Diagnosis not present

## 2023-02-01 DIAGNOSIS — Z0000 Encounter for general adult medical examination without abnormal findings: Secondary | ICD-10-CM | POA: Diagnosis not present

## 2023-02-01 DIAGNOSIS — F410 Panic disorder [episodic paroxysmal anxiety] without agoraphobia: Secondary | ICD-10-CM | POA: Diagnosis not present

## 2023-02-02 DIAGNOSIS — M4316 Spondylolisthesis, lumbar region: Secondary | ICD-10-CM | POA: Diagnosis not present

## 2023-02-02 DIAGNOSIS — S32009K Unspecified fracture of unspecified lumbar vertebra, subsequent encounter for fracture with nonunion: Secondary | ICD-10-CM | POA: Diagnosis not present

## 2023-02-25 DIAGNOSIS — H903 Sensorineural hearing loss, bilateral: Secondary | ICD-10-CM | POA: Diagnosis not present

## 2023-03-01 DIAGNOSIS — M4306 Spondylolysis, lumbar region: Secondary | ICD-10-CM | POA: Diagnosis not present

## 2023-03-03 DIAGNOSIS — M4306 Spondylolysis, lumbar region: Secondary | ICD-10-CM | POA: Diagnosis not present

## 2023-03-08 DIAGNOSIS — M4306 Spondylolysis, lumbar region: Secondary | ICD-10-CM | POA: Diagnosis not present

## 2023-03-12 DIAGNOSIS — M4306 Spondylolysis, lumbar region: Secondary | ICD-10-CM | POA: Diagnosis not present

## 2023-03-16 DIAGNOSIS — M4306 Spondylolysis, lumbar region: Secondary | ICD-10-CM | POA: Diagnosis not present

## 2023-03-22 DIAGNOSIS — M4306 Spondylolysis, lumbar region: Secondary | ICD-10-CM | POA: Diagnosis not present

## 2023-03-26 DIAGNOSIS — M4306 Spondylolysis, lumbar region: Secondary | ICD-10-CM | POA: Diagnosis not present

## 2023-03-30 DIAGNOSIS — M4306 Spondylolysis, lumbar region: Secondary | ICD-10-CM | POA: Diagnosis not present

## 2023-04-02 DIAGNOSIS — M4306 Spondylolysis, lumbar region: Secondary | ICD-10-CM | POA: Diagnosis not present

## 2023-04-05 DIAGNOSIS — M4306 Spondylolysis, lumbar region: Secondary | ICD-10-CM | POA: Diagnosis not present

## 2023-04-06 DIAGNOSIS — M4316 Spondylolisthesis, lumbar region: Secondary | ICD-10-CM | POA: Diagnosis not present

## 2023-04-06 DIAGNOSIS — M47816 Spondylosis without myelopathy or radiculopathy, lumbar region: Secondary | ICD-10-CM | POA: Diagnosis not present

## 2023-04-14 DIAGNOSIS — M4306 Spondylolysis, lumbar region: Secondary | ICD-10-CM | POA: Diagnosis not present

## 2023-04-20 DIAGNOSIS — M4306 Spondylolysis, lumbar region: Secondary | ICD-10-CM | POA: Diagnosis not present

## 2023-04-23 DIAGNOSIS — M4306 Spondylolysis, lumbar region: Secondary | ICD-10-CM | POA: Diagnosis not present

## 2023-04-27 DIAGNOSIS — Z9889 Other specified postprocedural states: Secondary | ICD-10-CM | POA: Diagnosis not present

## 2023-04-27 DIAGNOSIS — M47816 Spondylosis without myelopathy or radiculopathy, lumbar region: Secondary | ICD-10-CM | POA: Diagnosis not present

## 2023-04-29 DIAGNOSIS — M4306 Spondylolysis, lumbar region: Secondary | ICD-10-CM | POA: Diagnosis not present

## 2023-05-07 DIAGNOSIS — Z1231 Encounter for screening mammogram for malignant neoplasm of breast: Secondary | ICD-10-CM | POA: Diagnosis not present

## 2023-05-10 DIAGNOSIS — M4306 Spondylolysis, lumbar region: Secondary | ICD-10-CM | POA: Diagnosis not present

## 2023-05-13 DIAGNOSIS — J320 Chronic maxillary sinusitis: Secondary | ICD-10-CM | POA: Diagnosis not present

## 2023-05-13 DIAGNOSIS — Z6824 Body mass index (BMI) 24.0-24.9, adult: Secondary | ICD-10-CM | POA: Diagnosis not present

## 2023-05-16 IMAGING — RF DG LUMBAR SPINE 2-3V
1 series · 2 of 2 positions shown · non-contrast
Comparison: Intraoperative film from earlier in the same day.

CLINICAL DATA: Lumbar fusion

EXAM:
LUMBAR SPINE - 2-3 VIEW

[Series 1: run · 2 of 2 slices shown]
[im 1/2]
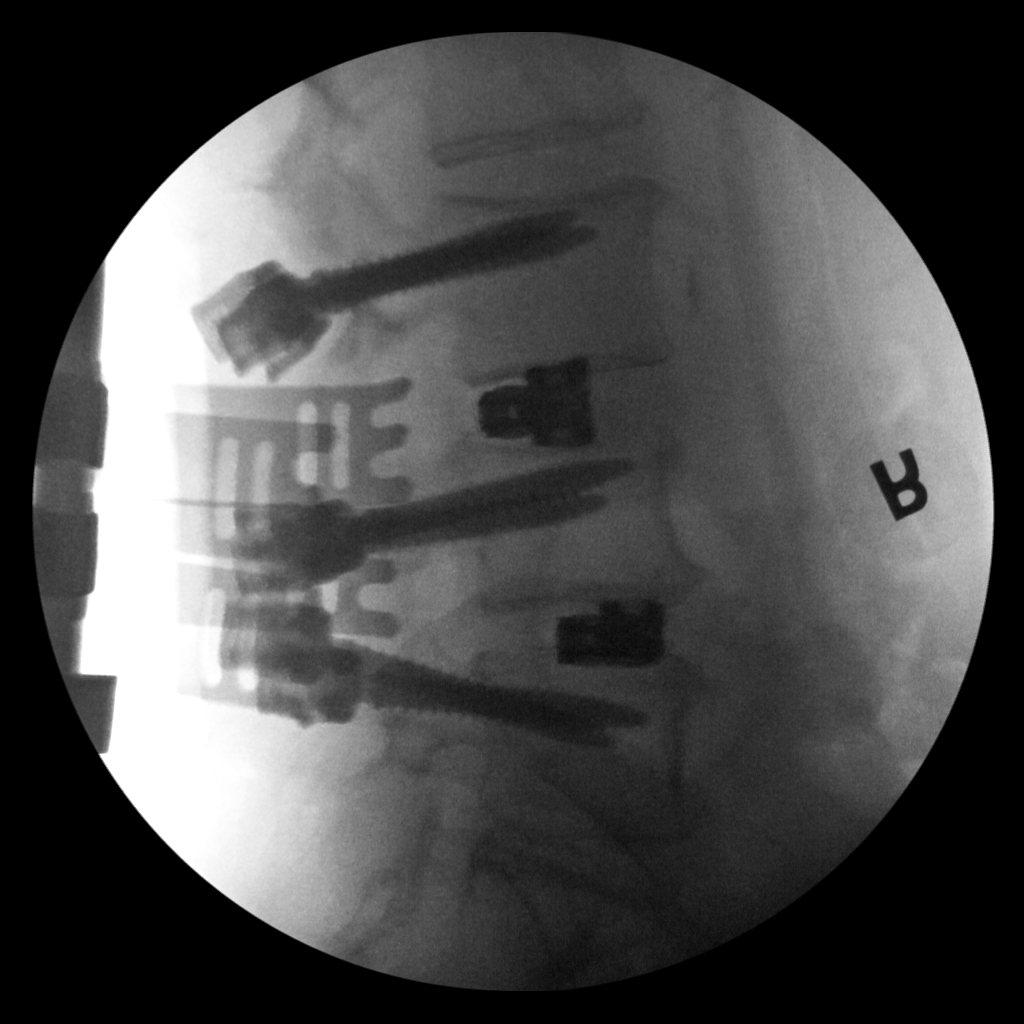
[im 2/2]
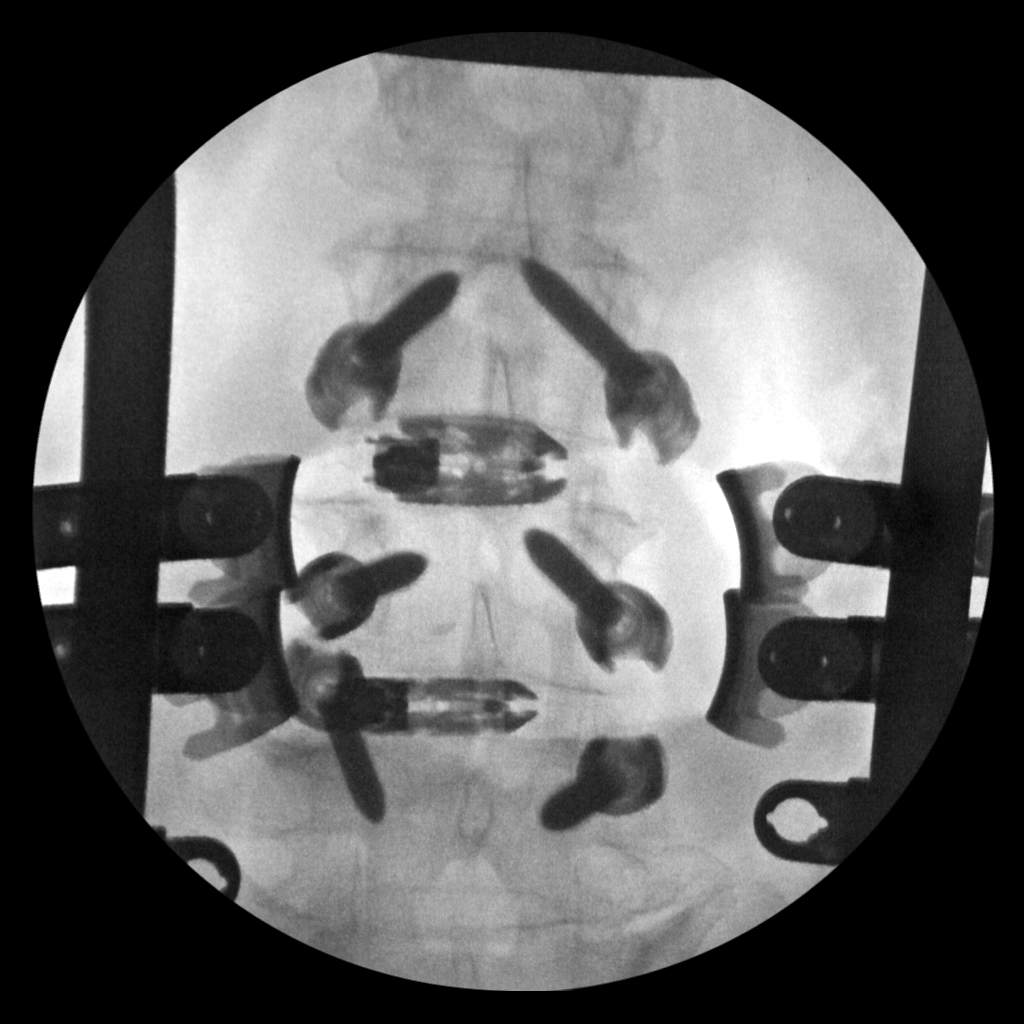

[2 of 2 positions shown; findings below may reference images not displayed]

FLUOROSCOPY TIME:  Radiation Exposure Index (as provided by the
fluoroscopic device): 28.81 mGy

If the device does not provide the exposure index:

Fluoroscopy Time:  29 seconds

Number of Acquired Images:  2
FINDINGS: Images demonstrate evidence of interbody fusion at L3-4 and L4-5.
Pedicle screws are noted at all 3 levels without posterior fixation
elements.
IMPRESSION: Lumbar fusion from L3-L5.

## 2023-05-16 IMAGING — CR DG LUMBAR SPINE 1V
1 series · 1 of 1 positions shown · non-contrast
Comparison: Lumbar spine radiographs 06/11/2020

CLINICAL DATA: Intraoperative image for localization

EXAM:
LUMBAR SPINE - 1 VIEW

[lateral]
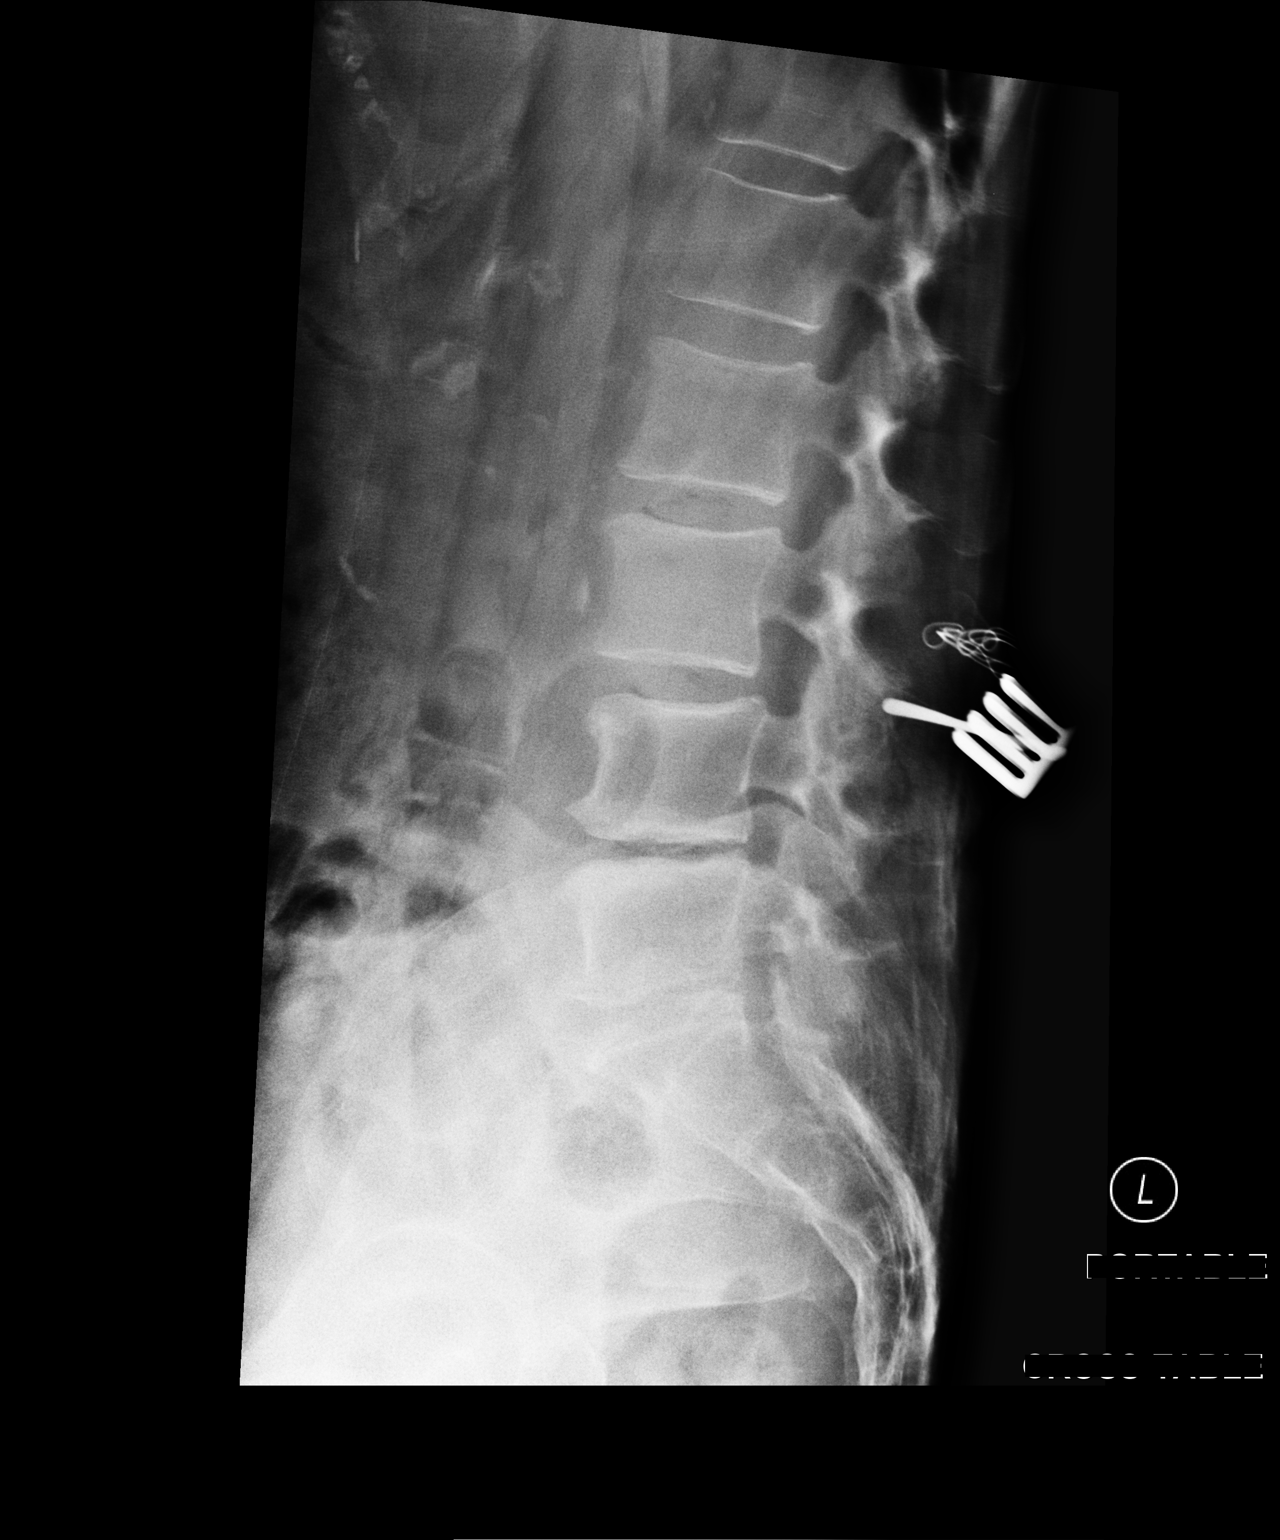

[1 of 1 positions shown; findings below may reference images not displayed]

FINDINGS: A single lateral intraoperative radiograph is submitted. Surgical
hardware is noted just posterior to the L3-L4 facet joints. There is
grade 1 anterolisthesis of L3 on L4. There is intervertebral disc
space narrowing and vacuum disc phenomenon at L4-L5. Alignment is
otherwise normal.
IMPRESSION: Surgical hardware noted just posterior to the L3-L4 facet joints.

## 2023-05-18 DIAGNOSIS — M4306 Spondylolysis, lumbar region: Secondary | ICD-10-CM | POA: Diagnosis not present

## 2023-05-21 DIAGNOSIS — M4306 Spondylolysis, lumbar region: Secondary | ICD-10-CM | POA: Diagnosis not present

## 2023-06-01 DIAGNOSIS — M47816 Spondylosis without myelopathy or radiculopathy, lumbar region: Secondary | ICD-10-CM | POA: Diagnosis not present

## 2023-06-15 DIAGNOSIS — M47816 Spondylosis without myelopathy or radiculopathy, lumbar region: Secondary | ICD-10-CM | POA: Diagnosis not present

## 2023-08-04 DIAGNOSIS — J320 Chronic maxillary sinusitis: Secondary | ICD-10-CM | POA: Diagnosis not present

## 2023-08-04 DIAGNOSIS — Z6824 Body mass index (BMI) 24.0-24.9, adult: Secondary | ICD-10-CM | POA: Diagnosis not present

## 2023-08-04 DIAGNOSIS — R002 Palpitations: Secondary | ICD-10-CM | POA: Diagnosis not present

## 2023-10-15 DIAGNOSIS — M4316 Spondylolisthesis, lumbar region: Secondary | ICD-10-CM | POA: Diagnosis not present

## 2023-10-27 DIAGNOSIS — M1990 Unspecified osteoarthritis, unspecified site: Secondary | ICD-10-CM | POA: Insufficient documentation

## 2023-10-27 DIAGNOSIS — I10 Essential (primary) hypertension: Secondary | ICD-10-CM | POA: Insufficient documentation

## 2023-10-27 DIAGNOSIS — F419 Anxiety disorder, unspecified: Secondary | ICD-10-CM | POA: Insufficient documentation

## 2023-10-27 DIAGNOSIS — Z9889 Other specified postprocedural states: Secondary | ICD-10-CM | POA: Insufficient documentation

## 2023-10-27 DIAGNOSIS — K219 Gastro-esophageal reflux disease without esophagitis: Secondary | ICD-10-CM | POA: Insufficient documentation

## 2023-10-28 ENCOUNTER — Encounter: Payer: Self-pay | Admitting: Cardiology

## 2023-10-28 ENCOUNTER — Ambulatory Visit: Attending: Cardiology | Admitting: Cardiology

## 2023-10-28 VITALS — BP 148/78 | HR 123 | Ht 70.0 in | Wt 162.4 lb

## 2023-10-28 DIAGNOSIS — I10 Essential (primary) hypertension: Secondary | ICD-10-CM | POA: Insufficient documentation

## 2023-10-28 DIAGNOSIS — I4891 Unspecified atrial fibrillation: Secondary | ICD-10-CM | POA: Insufficient documentation

## 2023-10-28 HISTORY — DX: Essential (primary) hypertension: I10

## 2023-10-28 HISTORY — DX: Unspecified atrial fibrillation: I48.91

## 2023-10-28 MED ORDER — METOPROLOL SUCCINATE ER 50 MG PO TB24: 50.0000 mg | ORAL_TABLET | Freq: Every day | ORAL | 3 refills | Status: DC

## 2023-10-28 NOTE — Patient Instructions (Addendum)
 Medication Instructions:  Your physician has recommended you make the following change in your medication:   Stop aspirin daily  Stop Bisoprolol -hydrochlorothiazide   Start Toprol  XL 50 mg daily  *If you need a refill on your cardiac medications before your next appointment, please call your pharmacy*   Lab Work: Your physician recommends that you have a CMP, CBC and TSH today in the office.  If you have labs (blood work) drawn today and your tests are completely normal, you will receive your results only by: MyChart Message (if you have MyChart) OR A paper copy in the mail If you have any lab test that is abnormal or we need to change your treatment, we will call you to review the results.  Testing/Procedures: Your physician has requested that you have an echocardiogram. Echocardiography is a painless test that uses sound waves to create images of your heart. It provides your doctor with information about the size and shape of your heart and how well your heart's chambers and valves are working. This procedure takes approximately one hour. There are no restrictions for this procedure. Please do NOT wear cologne, perfume, aftershave, or lotions (deodorant is allowed). Please arrive 15 minutes prior to your appointment time.  Please note: We ask at that you not bring children with you during ultrasound (echo/ vascular) testing. Due to room size and safety concerns, children are not allowed in the ultrasound rooms during exams. Our front office staff cannot provide observation of children in our lobby area while testing is being conducted. An adult accompanying a patient to their appointment will only be allowed in the ultrasound room at the discretion of the ultrasound technician under special circumstances. We apologize for any inconvenience.  Follow-Up: At [facility], you and your health needs are our priority.  As part of our continuing mission to provide you with exceptional heart  care, we have created designated Provider Care Teams.  These Care Teams include your primary Cardiologist (physician) and Advanced Practice Providers (APPs -  Physician Assistants and Nurse Practitioners) who all work together to provide you with the care you need, when you need it.  We recommend signing up for the patient portal called MyChart.  Sign up information is provided on this After Visit Summary.  MyChart is used to connect with patients for Virtual Visits (Telemedicine).  Patients are able to view lab/test results, encounter notes, upcoming appointments, etc.  Non-urgent messages can be sent to your provider as well.   To learn more about what you can do with MyChart, go to [email or web address].    Your next appointment:   1 month(s)  The format for your next appointment:   In Person  Provider:   Jennifer Crape, MD   Other Instructions Echocardiogram An echocardiogram is a test that uses sound waves (ultrasound) to produce images of the heart. Images from an echocardiogram can provide important information about: Heart size and shape. The size and thickness and movement of your heart's walls. Heart muscle function and strength. Heart valve function or if you have stenosis. Stenosis is when the heart valves are too narrow. If blood is flowing backward through the heart valves (regurgitation). A tumor or infectious growth around the heart valves. Areas of heart muscle that are not working well because of poor blood flow or injury from a heart attack. Aneurysm detection. An aneurysm is a weak or damaged part of an artery wall. The wall bulges out from the normal force of blood pumping through the body.  Tell a health care provider about: Any allergies you have. All medicines you are taking, including vitamins, herbs, eye drops, creams, and over-the-counter medicines. Any blood disorders you have. Any surgeries you have had. Any medical conditions you have. Whether you are  pregnant or may be pregnant. What are the risks? Generally, this is a safe test. However, problems may occur, including an allergic reaction to dye (contrast) that may be used during the test. What happens before the test? No specific preparation is needed. You may eat and drink normally. What happens during the test? You will take off your clothes from the waist up and put on a hospital gown. Electrodes or electrocardiogram (ECG)patches may be placed on your chest. The electrodes or patches are then connected to a device that monitors your heart rate and rhythm. You will lie down on a table for an ultrasound exam. A gel will be applied to your chest to help sound waves pass through your skin. A handheld device, called a transducer, will be pressed against your chest and moved over your heart. The transducer produces sound waves that travel to your heart and bounce back (or echo back) to the transducer. These sound waves will be captured in real-time and changed into images of your heart that can be viewed on a video monitor. The images will be recorded on a computer and reviewed by your health care provider. You may be asked to change positions or hold your breath for a short time. This makes it easier to get different views or better views of your heart. In some cases, you may receive contrast through an IV in one of your veins. This can improve the quality of the pictures from your heart. The procedure may vary among health care providers and hospitals.   What can I expect after the test? You may return to your normal, everyday life, including diet, activities, and medicines, unless your health care provider tells you not to do that. Follow these instructions at home: It is up to you to get the results of your test. Ask your health care provider, or the department that is doing the test, when your results will be ready. Keep all follow-up visits. This is important. Summary An echocardiogram  is a test that uses sound waves (ultrasound) to produce images of the heart. Images from an echocardiogram can provide important information about the size and shape of your heart, heart muscle function, heart valve function, and other possible heart problems. You do not need to do anything to prepare before this test. You may eat and drink normally. After the echocardiogram is completed, you may return to your normal, everyday life, unless your health care provider tells you not to do that. This information is not intended to replace advice given to you by your health care provider. Make sure you discuss any questions you have with your health care provider. Document Revised: 10/24/2019 Document Reviewed: 10/24/2019 Elsevier Patient Education  2021 Elsevier Inc.   Important Information About Sugar

## 2023-10-28 NOTE — Progress Notes (Signed)
 Cardiology Office Note:    Date:  10/28/2023   ID:  Ann Diaz, DOB 11-01-1945, MRN [id number]  PCP:  Fernand Tracey DELENA, MD  Cardiologist:  Jennifer JONELLE Crape, MD   Referring MD: Fernand Tracey DELENA, MD    ASSESSMENT:    1. Hypertension, unspecified type   2. New onset atrial fibrillation (HCC)   3. Essential hypertension    PLAN:    In order of problems listed above:  Primary prevention stressed with the patient.  Importance of compliance with diet medication stressed and patient verbalized standing. Newly diagnosed atrial fibrillation:I discussed with the patient atrial fibrillation, disease process. Management and therapy including rate and rhythm control, anticoagulation benefits and potential risks were discussed extensively with the patient. Patient had multiple questions which were answered to patient's satisfaction.  In view of history of syncope abnormal initiate anticoagulation at this time.  Hopefully with heart rate control she feels better.  She has never had a passing out spell before or after this. Essential hypertension: I changed her medication.  I changed her to Toprol  XL 50 mg daily.  She will be back in 1 week for pulse blood pressure check and nurse visit. Cardiac murmur: Echocardiogram will be done to assess murmur heard on auscultation. Further recommendations will be made based on the findings of the aforementioned tests and follow-up and she will be seen in follow-up appointment in a month or earlier if she has any questions.  I will initiate her on anticoagulation once I know her heart rate and blood pressure is stable and that she is not having any issues with dizziness or gait instability or syncope risks.  Because at this time I believe that the risks of anticoagulation outweigh the benefits.  This was discussed with her at extensive length and questions were answered to her satisfaction and she agrees.   Medication Adjustments/Labs and Tests Ordered: Current  medicines are reviewed at length with the patient today.  Concerns regarding medicines are outlined above.  Orders Placed This Encounter  Procedures   EKG 12-Lead   No orders of the defined types were placed in this encounter.    History of Present Illness:    Ann Diaz is a 78 y.o. female who is being seen today for the evaluation of newly diagnosed atrial fibrillation at the request of Fernand Tracey DELENA, MD. patient is a pleasant 78 year old female.  She has history of essential hypertension.  She complained to her primary care about palpitations.  She has come to see me here today.  Her EKG reveals atrial fibrillation with elevated ventricular rate.  She denies chest pain orthopnea or PND but with exertion she gets out of breath.  At the time of my evaluation, the patient is alert awake oriented and in no distress.  She has no history of dyslipidemia diabetes mellitus.  No history of stroke.  She mentions to me that she felt weak and passed out on Saturday.  Subsequently she is fine.  She has never passed out before or after that.  Past Medical History:  Diagnosis Date   Anxiety    Arthritis    GERD (gastroesophageal reflux disease)    Hypertension    Lumbar pseudoarthrosis 07/31/2021   PONV (postoperative nausea and vomiting)    Spondylolisthesis, lumbar region 11/25/2020    Past Surgical History:  Procedure Laterality Date   BACK SURGERY     11/25/2020   CHOLECYSTECTOMY     EYE SURGERY  FRACTURE SURGERY Left    left wrist   TUBAL LIGATION      Current Medications: Current Meds  Medication Sig   ALPRAZolam  (XANAX ) 0.5 MG tablet Take 0.5 mg by mouth 2 (two) times daily as needed for anxiety.   aspirin EC 81 MG tablet Take 81 mg by mouth in the morning. Swallow whole.   Biotin 5000 MCG CAPS Take 5,000 mcg by mouth daily.   bisoprolol -hydrochlorothiazide  (ZIAC ) 5-6.25 MG tablet Take 1 tablet by mouth in the morning.   Calcium Carb-Cholecalciferol (CALCIUM 600+D3 PO) Take  1 tablet by mouth in the morning.   cyclobenzaprine  (FLEXERIL ) 5 MG tablet Take 1 tablet (5 mg total) by mouth 3 (three) times daily as needed for muscle spasms.   docusate sodium  (COLACE) 100 MG capsule Take 1 capsule (100 mg total) by mouth 2 (two) times daily. (Patient taking differently: Take 100 mg by mouth daily as needed for mild constipation or moderate constipation.)   famotidine  (PEPCID ) 20 MG tablet Take 20 mg by mouth daily as needed for heartburn or indigestion.   oxymetazoline  (AFRIN) 0.05 % nasal spray Place 1 spray into both nostrils at bedtime.   Turmeric 500 MG CAPS Take 500 mg by mouth daily.     Allergies:   Sulfa antibiotics, Codeine, Morphine , Meperidine, and Tramadol hcl   Social History   Socioeconomic History   Marital status: Married    Spouse name: Not on file   Number of children: Not on file   Years of education: Not on file   Highest education level: Not on file  Occupational History   Not on file  Tobacco Use   Smoking status: Never   Smokeless tobacco: Never  Vaping Use   Vaping status: Never Used  Substance and Sexual Activity   Alcohol use: Never   Drug use: Never   Sexual activity: Not on file  Other Topics Concern   Not on file  Social History Narrative   Not on file   Social Drivers of Health   Financial Resource Strain: Not on file  Food Insecurity: Low Risk  (01/12/2023)   Received from [facility]   Hunger Vital Sign    Within the past 12 months, you worried that your food would run out before you got money to buy more: Never true    Within the past 12 months, the food you bought just didn't last and you didn't have money to get more. : Never true  Transportation Needs: No Transportation Needs (01/12/2023)   Received from [facility]    In the past 12 months, has lack of reliable transportation kept you from medical appointments, meetings, work or from getting things needed for daily living? : No  Physical  Activity: Not on file  Stress: Not on file  Social Connections: Unknown (07/29/2021)   Received from [facility]   Social Network    Social Network: Not on file     Family History: The patient's family history includes Diabetes in her brother; Heart attack in her brother; Hypertension in her brother, father, and mother. There is no history of Cancer.  ROS:   Please see the history of present illness.    All other systems reviewed and are negative.  EKGs/Labs/Other Studies Reviewed:    The following studies were reviewed today:  EKG Interpretation Date/Time:  Thursday October 28 2023 13:39:50 EDT Ventricular Rate:  123 PR Interval:    QRS Duration:  74 QT Interval:  318 QTC Calculation: 455 R Axis:   37  Text Interpretation: Atrial fibrillation with rapid ventricular response Septal infarct (cited on or before 14-Nov-2020) Abnormal ECG When compared with ECG of 14-Nov-2020 12:22, Atrial fibrillation has replaced Sinus rhythm Vent. rate has increased BY  67 BPM ST now depressed in Inferior leads Confirmed by Edwyna Backers [phone]) on 10/28/2023 2:00:07 PM     Recent Labs: No results found for requested labs within last 365 days.  Recent Lipid Panel No results found for: CHOL, TRIG, HDL, CHOLHDL, VLDL, LDLCALC, LDLDIRECT  Physical Exam:    VS:  BP (!) 148/78   Pulse (!) 123   Ht 5' 10 (1.778 m)   Wt 162 lb 6.4 oz (73.7 kg)   SpO2 95%   BMI 23.30 kg/m     Wt Readings from Last 3 Encounters:  10/28/23 162 lb 6.4 oz (73.7 kg)  07/31/21 163 lb (73.9 kg)  07/28/21 165 lb 1.6 oz (74.9 kg)     GEN: Patient is in no acute distress HEENT: Normal NECK: No JVD; No carotid bruits LYMPHATICS: No lymphadenopathy CARDIAC: S1 S2 irregular, 2/6 systolic murmur at the apex. RESPIRATORY:  Clear to auscultation without rales, wheezing or rhonchi  ABDOMEN: Soft, non-tender, non-distended MUSCULOSKELETAL:  No edema; No deformity  SKIN: Warm and dry NEUROLOGIC:   Alert and oriented x 3 PSYCHIATRIC:  Normal affect    Signed, Backers JONELLE Edwyna, MD  10/28/2023 2:15 PM    [facility]

## 2023-10-29 LAB — COMPREHENSIVE METABOLIC PANEL WITH GFR
ALT: 14 IU/L (ref 0–32)
AST: 23 IU/L (ref 0–40)
Albumin: 4.4 g/dL (ref 3.8–4.8)
Alkaline Phosphatase: 82 IU/L (ref 44–121)
BUN/Creatinine Ratio: 24 (ref 12–28)
BUN: 20 mg/dL (ref 8–27)
Bilirubin Total: 0.3 mg/dL (ref 0.0–1.2)
CO2: 22 mmol/L (ref 20–29)
Calcium: 10.2 mg/dL (ref 8.7–10.3)
Chloride: 101 mmol/L (ref 96–106)
Creatinine, Ser: 0.82 mg/dL (ref 0.57–1.00)
Globulin, Total: 2.6 g/dL (ref 1.5–4.5)
Glucose: 84 mg/dL (ref 70–99)
Potassium: 5.3 mmol/L — ABNORMAL HIGH (ref 3.5–5.2)
Sodium: 140 mmol/L (ref 134–144)
Total Protein: 7 g/dL (ref 6.0–8.5)
eGFR: 75 mL/min/1.73 (ref >59)

## 2023-10-29 LAB — CBC
Hematocrit: 42 % (ref 34.0–46.6)
Hemoglobin: 13.5 g/dL (ref 11.1–15.9)
MCH: 29.1 pg (ref 26.6–33.0)
MCHC: 32.1 g/dL (ref 31.5–35.7)
MCV: 91 fL (ref 79–97)
Platelets: 212 x10E3/uL (ref 150–450)
RBC: 4.64 x10E6/uL (ref 3.77–5.28)
RDW: 13.2 % (ref 11.7–15.4)
WBC: 8.9 x10E3/uL (ref 3.4–10.8)

## 2023-10-29 LAB — TSH: TSH: 0.948 u[IU]/mL (ref 0.450–4.500)

## 2023-11-03 ENCOUNTER — Ambulatory Visit: Payer: Self-pay | Admitting: Cardiology

## 2023-11-04 ENCOUNTER — Ambulatory Visit: Attending: Cardiology

## 2023-11-04 VITALS — BP 136/68 | HR 78 | Resp 18 | Ht 70.0 in | Wt 163.6 lb

## 2023-11-04 DIAGNOSIS — I4891 Unspecified atrial fibrillation: Secondary | ICD-10-CM

## 2023-11-04 NOTE — Progress Notes (Signed)
   Nurse Visit   Date of Encounter: 11/04/2023 ID: VENTURA LEGGITT, DOB [birth year], MRN [id number]  PCP:  Fernand Tracey DELENA, MD   [facility] Providers Cardiologist:  Revankar    Visit Details   VS:  BP 136/68 (BP Location: Right Arm, Patient Position: Sitting, Cuff Size: Normal)   Pulse 78   Resp 18   Ht 5' 10 (1.778 m)   Wt 163 lb 9.6 oz (74.2 kg)   SpO2 98%   BMI 23.47 kg/m  , BMI Body mass index is 23.47 kg/m.  Wt Readings from Last 3 Encounters:  11/04/23 163 lb 9.6 oz (74.2 kg)  10/28/23 162 lb 6.4 oz (73.7 kg)  07/31/21 163 lb (73.9 kg)     Reason for visit: EKG Performed today: Vitals, EKG, Provider consulted:Revankar, and Education Changes (medications, testing, etc.) : no changes Length of Visit: 20 minutes    Medications Adjustments/Labs and Tests Ordered: Orders Placed This Encounter  Procedures   EKG 12-Lead   No orders of the defined types were placed in this encounter.    Signed, Melene Meissner, RN  11/04/2023 11:10 AM

## 2023-11-19 ENCOUNTER — Ambulatory Visit: Attending: Cardiology

## 2023-11-19 DIAGNOSIS — I4891 Unspecified atrial fibrillation: Secondary | ICD-10-CM | POA: Diagnosis not present

## 2023-11-20 LAB — ECHOCARDIOGRAM COMPLETE
AR max vel: 2.65 cm2
AV Area VTI: 2.82 cm2
AV Area mean vel: 2.57 cm2
AV Mean grad: 2 mmHg
AV Peak grad: 4 mmHg
Ao pk vel: 1 m/s
Area-P 1/2: 2.21 cm2
MV M vel: 3.42 m/s
MV Peak grad: 46.8 mmHg
MV VTI: 1.36 cm2
P 1/2 time: 661 ms
S' Lateral: 2.9 cm

## 2023-12-02 ENCOUNTER — Ambulatory Visit: Admitting: Cardiology

## 2023-12-09 DIAGNOSIS — M5090 Cervical disc disorder, unspecified, unspecified cervical region: Secondary | ICD-10-CM | POA: Diagnosis not present

## 2023-12-09 DIAGNOSIS — M5412 Radiculopathy, cervical region: Secondary | ICD-10-CM | POA: Diagnosis not present

## 2023-12-14 ENCOUNTER — Other Ambulatory Visit: Payer: Self-pay

## 2023-12-15 ENCOUNTER — Ambulatory Visit: Attending: Cardiology | Admitting: Cardiology

## 2023-12-15 ENCOUNTER — Encounter: Payer: Self-pay | Admitting: Cardiology

## 2023-12-15 ENCOUNTER — Ambulatory Visit

## 2023-12-15 VITALS — BP 144/64 | HR 67 | Ht 70.0 in | Wt 160.6 lb

## 2023-12-15 DIAGNOSIS — I10 Essential (primary) hypertension: Secondary | ICD-10-CM

## 2023-12-15 DIAGNOSIS — I4891 Unspecified atrial fibrillation: Secondary | ICD-10-CM | POA: Diagnosis not present

## 2023-12-15 MED ORDER — APIXABAN 5 MG PO TABS: 5.0000 mg | ORAL_TABLET | Freq: Two times a day (BID) | ORAL | 12 refills | Status: DC

## 2023-12-15 NOTE — Progress Notes (Signed)
 Cardiology Office Note:    Date:  12/15/2023   ID:  Ann Diaz, DOB 04-17-1945, MRN [id number]  PCP:  Fernand Tracey DELENA, MD  Cardiologist:  Jennifer JONELLE Crape, MD   Referring MD: Fernand Tracey DELENA, MD    ASSESSMENT:    1. Essential hypertension   2. New onset atrial fibrillation (HCC)    PLAN:    In order of problems listed above:  Primary prevention stressed with the patient.  Importance of compliance with diet medication stressed and patient verbalized standing. Paroxysmal atrial fibrillation:I discussed with the patient atrial fibrillation, disease process. Management and therapy including rate and rhythm control, anticoagulation benefits and potential risks were discussed extensively with the patient. Patient had multiple questions which were answered to patient's satisfaction. I initiated on Eliquis 5 mg twice daily.  I told her to benefits and risks.  She will keep a track of her stools and make sure she is not having dark stools.  She will have an ifob to check for occult blood. Essential hypertension: Blood pressure stable and diet was emphasized.  Lifestyle modification urged.  She was advised to walk at least half an hour a day on a daily basis.  Salt intake issues were discussed. Patient will be seen in follow-up appointment in 6 months or earlier if the patient has any concerns.    Medication Adjustments/Labs and Tests Ordered: Current medicines are reviewed at length with the patient today.  Concerns regarding medicines are outlined above.  Orders Placed This Encounter  Procedures   EKG 12-Lead   No orders of the defined types were placed in this encounter.    No chief complaint on file.    History of Present Illness:    Ann Diaz is a 78 y.o. female.  Patient has past medical history of essential hypertension and was found to have newly diagnosed atrial fibrillation.  She has had episodes of syncope therefore I had not put her on anticoagulation.  Subsequently  over the past several weeks she tells me that she feels much better.  No palpitation no chest pain orthopnea or PND.  At the time of my evaluation, the patient is alert awake oriented and in no distress.  Past Medical History:  Diagnosis Date   Anxiety    Arthritis    Essential hypertension 10/28/2023   GERD (gastroesophageal reflux disease)    Hypertension    Lumbar pseudoarthrosis 07/31/2021   New onset atrial fibrillation (HCC) 10/28/2023   PONV (postoperative nausea and vomiting)    Spondylolisthesis, lumbar region 11/25/2020    Past Surgical History:  Procedure Laterality Date   BACK SURGERY     11/25/2020   CHOLECYSTECTOMY     EYE SURGERY     FRACTURE SURGERY Left    left wrist   TUBAL LIGATION      Current Medications: Current Meds  Medication Sig   ALPRAZolam  (XANAX ) 0.5 MG tablet Take 0.5 mg by mouth 2 (two) times daily as needed for anxiety.   Biotin 5000 MCG CAPS Take 5,000 mcg by mouth daily.   Calcium Carb-Cholecalciferol (CALCIUM 600+D3 PO) Take 1 tablet by mouth in the morning.   cyclobenzaprine  (FLEXERIL ) 5 MG tablet Take 1 tablet (5 mg total) by mouth 3 (three) times daily as needed for muscle spasms.   diclofenac (VOLTAREN) 75 MG EC tablet Take 75 mg by mouth 2 (two) times daily.   docusate sodium  (COLACE) 100 MG capsule Take 1 capsule (100 mg total) by mouth 2 (  two) times daily. (Patient taking differently: Take 100 mg by mouth daily as needed for mild constipation or moderate constipation.)   famotidine  (PEPCID ) 20 MG tablet Take 20 mg by mouth daily as needed for heartburn or indigestion.   metoprolol  succinate (TOPROL -XL) 50 MG 24 hr tablet Take 1 tablet (50 mg total) by mouth daily. Take with or immediately following a meal.   Omega-3 Fatty Acids (FISH OIL) 1000 MG CAPS Take 1,000 mg by mouth in the morning.   oxymetazoline  (AFRIN) 0.05 % nasal spray Place 1 spray into both nostrils at bedtime.   Turmeric 500 MG CAPS Take 500 mg by mouth daily.      Allergies:   Sulfa antibiotics, Codeine, Morphine , Meperidine, and Tramadol hcl   Social History   Socioeconomic History   Marital status: Married    Spouse name: Not on file   Number of children: Not on file   Years of education: Not on file   Highest education level: Not on file  Occupational History   Not on file  Tobacco Use   Smoking status: Never   Smokeless tobacco: Never  Vaping Use   Vaping status: Never Used  Substance and Sexual Activity   Alcohol use: Never   Drug use: Never   Sexual activity: Not on file  Other Topics Concern   Not on file  Social History Narrative   Not on file   Social Drivers of Health   Financial Resource Strain: Not on file  Food Insecurity: Low Risk  (01/12/2023)   Received from [facility]   Hunger Vital Sign    Within the past 12 months, you worried that your food would run out before you got money to buy more: Never true    Within the past 12 months, the food you bought just didn't last and you didn't have money to get more. : Never true  Transportation Needs: No Transportation Needs (01/12/2023)   Received from [facility]    In the past 12 months, has lack of reliable transportation kept you from medical appointments, meetings, work or from getting things needed for daily living? : No  Physical Activity: Not on file  Stress: Not on file  Social Connections: Unknown (07/29/2021)   Received from [facility]   Social Network    Social Network: Not on file     Family History: The patient's family history includes Diabetes in her brother; Heart attack in her brother; Hypertension in her brother, father, and mother. There is no history of Cancer.  ROS:   Please see the history of present illness.    All other systems reviewed and are negative.  EKGs/Labs/Other Studies Reviewed:    The following studies were reviewed today: .SABRAEKG Interpretation Date/Time:  Wednesday December 15 2023 16:16:38  EDT Ventricular Rate:  67 PR Interval:  174 QRS Duration:  74 QT Interval:  370 QTC Calculation: 390 R Axis:   21  Text Interpretation: Normal sinus rhythm Normal ECG When compared with ECG of 04-Nov-2023 10:44, Premature atrial complexes are no longer Present Confirmed by Edwyna Backers [phone]) on 12/15/2023 4:42:16 PM     Recent Labs: 10/28/2023: ALT 14; BUN 20; Creatinine, Ser 0.82; Hemoglobin 13.5; Platelets 212; Potassium 5.3; Sodium 140; TSH 0.948  Recent Lipid Panel No results found for: CHOL, TRIG, HDL, CHOLHDL, VLDL, LDLCALC, LDLDIRECT  Physical Exam:    VS:  BP (!) 144/64   Pulse 67   Ht 5' 10 (1.778 m)  Wt 160 lb 9.6 oz (72.8 kg)   SpO2 98%   BMI 23.04 kg/m     Wt Readings from Last 3 Encounters:  12/15/23 160 lb 9.6 oz (72.8 kg)  11/04/23 163 lb 9.6 oz (74.2 kg)  10/28/23 162 lb 6.4 oz (73.7 kg)     GEN: Patient is in no acute distress HEENT: Normal NECK: No JVD; No carotid bruits LYMPHATICS: No lymphadenopathy CARDIAC: Hear sounds regular, 2/6 systolic murmur at the apex. RESPIRATORY:  Clear to auscultation without rales, wheezing or rhonchi  ABDOMEN: Soft, non-tender, non-distended MUSCULOSKELETAL:  No edema; No deformity  SKIN: Warm and dry NEUROLOGIC:  Alert and oriented x 3 PSYCHIATRIC:  Normal affect   Signed, Jennifer JONELLE Crape, MD  12/15/2023 4:52 PM    [facility]

## 2023-12-15 NOTE — Patient Instructions (Signed)
 Medication Instructions:  Your physician has recommended you make the following change in your medication:   Start Eliquis 5 mg twice daily  *If you need a refill on your cardiac medications before your next appointment, please call your pharmacy*   Lab Work: Do ifob for blood in stool If you have labs (blood work) drawn today and your tests are completely normal, you will receive your results only by: MyChart Message (if you have MyChart) OR A paper copy in the mail If you have any lab test that is abnormal or we need to change your treatment, we will call you to review the results.   Testing/Procedures: None ordered   Follow-Up: At [facility], you and your health needs are our priority.  As part of our continuing mission to provide you with exceptional heart care, we have created designated Provider Care Teams.  These Care Teams include your primary Cardiologist (physician) and Advanced Practice Providers (APPs -  Physician Assistants and Nurse Practitioners) who all work together to provide you with the care you need, when you need it.  We recommend signing up for the patient portal called MyChart.  Sign up information is provided on this After Visit Summary.  MyChart is used to connect with patients for Virtual Visits (Telemedicine).  Patients are able to view lab/test results, encounter notes, upcoming appointments, etc.  Non-urgent messages can be sent to your provider as well.   To learn more about what you can do with MyChart, go to [email or web address].    Your next appointment:   3 month(s)  The format for your next appointment:   In Person  Provider:   Jennifer Crape, MD    Other Instructions none  Important Information About Sugar

## 2023-12-16 DIAGNOSIS — H18513 Endothelial corneal dystrophy, bilateral: Secondary | ICD-10-CM | POA: Diagnosis not present

## 2023-12-16 DIAGNOSIS — Z961 Presence of intraocular lens: Secondary | ICD-10-CM | POA: Diagnosis not present

## 2023-12-16 DIAGNOSIS — H52223 Regular astigmatism, bilateral: Secondary | ICD-10-CM | POA: Diagnosis not present

## 2023-12-16 DIAGNOSIS — H524 Presbyopia: Secondary | ICD-10-CM | POA: Diagnosis not present

## 2023-12-22 DIAGNOSIS — M542 Cervicalgia: Secondary | ICD-10-CM | POA: Diagnosis not present

## 2023-12-22 DIAGNOSIS — I4891 Unspecified atrial fibrillation: Secondary | ICD-10-CM | POA: Diagnosis not present

## 2023-12-24 ENCOUNTER — Ambulatory Visit: Payer: Self-pay | Admitting: Cardiology

## 2023-12-24 DIAGNOSIS — I4891 Unspecified atrial fibrillation: Secondary | ICD-10-CM

## 2023-12-24 LAB — FECAL OCCULT BLOOD, IMMUNOCHEMICAL: Fecal Occult Bld: POSITIVE — AB

## 2023-12-27 DIAGNOSIS — I4891 Unspecified atrial fibrillation: Secondary | ICD-10-CM | POA: Diagnosis not present

## 2023-12-28 DIAGNOSIS — M542 Cervicalgia: Secondary | ICD-10-CM | POA: Diagnosis not present

## 2023-12-28 LAB — BASIC METABOLIC PANEL WITH GFR
BUN/Creatinine Ratio: 26 (ref 12–28)
BUN: 22 mg/dL (ref 8–27)
CO2: 22 mmol/L (ref 20–29)
Calcium: 9.9 mg/dL (ref 8.7–10.3)
Chloride: 106 mmol/L (ref 96–106)
Creatinine, Ser: 0.86 mg/dL (ref 0.57–1.00)
Glucose: 77 mg/dL (ref 70–99)
Potassium: 5.1 mmol/L (ref 3.5–5.2)
Sodium: 142 mmol/L (ref 134–144)
eGFR: 70 mL/min/1.73 (ref >59)

## 2023-12-28 LAB — CBC
Hematocrit: 38.1 % (ref 34.0–46.6)
Hemoglobin: 12.4 g/dL (ref 11.1–15.9)
MCH: 28.5 pg (ref 26.6–33.0)
MCHC: 32.5 g/dL (ref 31.5–35.7)
MCV: 88 fL (ref 79–97)
Platelets: 210 x10E3/uL (ref 150–450)
RBC: 4.35 x10E6/uL (ref 3.77–5.28)
RDW: 12.7 % (ref 11.7–15.4)
WBC: 5.6 x10E3/uL (ref 3.4–10.8)

## 2023-12-31 DIAGNOSIS — M542 Cervicalgia: Secondary | ICD-10-CM | POA: Diagnosis not present

## 2023-12-31 DIAGNOSIS — I4891 Unspecified atrial fibrillation: Secondary | ICD-10-CM | POA: Diagnosis not present

## 2024-01-03 DIAGNOSIS — M542 Cervicalgia: Secondary | ICD-10-CM | POA: Diagnosis not present

## 2024-01-05 DIAGNOSIS — I4891 Unspecified atrial fibrillation: Secondary | ICD-10-CM

## 2024-01-11 ENCOUNTER — Encounter: Payer: Self-pay | Admitting: Cardiology

## 2024-01-11 ENCOUNTER — Ambulatory Visit: Attending: Cardiology | Admitting: Cardiology

## 2024-01-11 VITALS — BP 140/82 | HR 68 | Ht 70.0 in | Wt 163.2 lb

## 2024-01-11 DIAGNOSIS — I340 Nonrheumatic mitral (valve) insufficiency: Secondary | ICD-10-CM | POA: Insufficient documentation

## 2024-01-11 DIAGNOSIS — I10 Essential (primary) hypertension: Secondary | ICD-10-CM | POA: Diagnosis not present

## 2024-01-11 DIAGNOSIS — I351 Nonrheumatic aortic (valve) insufficiency: Secondary | ICD-10-CM | POA: Insufficient documentation

## 2024-01-11 DIAGNOSIS — I4891 Unspecified atrial fibrillation: Secondary | ICD-10-CM

## 2024-01-11 DIAGNOSIS — M542 Cervicalgia: Secondary | ICD-10-CM | POA: Diagnosis not present

## 2024-01-11 HISTORY — DX: Nonrheumatic mitral (valve) insufficiency: I34.0

## 2024-01-11 NOTE — Patient Instructions (Signed)
 Medication Instructions:  Your physician recommends that you continue on your current medications as directed. Please refer to the Current Medication list given to you today.  *If you need a refill on your cardiac medications before your next appointment, please call your pharmacy*   Lab Work: Your physician recommends that you have a CMP and TSH today in the office.  If you have labs (blood work) drawn today and your tests are completely normal, you will receive your results only by: MyChart Message (if you have MyChart) OR A paper copy in the mail If you have any lab test that is abnormal or we need to change your treatment, we will call you to review the results.   Testing/Procedures: None ordered   Follow-Up: At [facility], you and your health needs are our priority.  As part of our continuing mission to provide you with exceptional heart care, we have created designated Provider Care Teams.  These Care Teams include your primary Cardiologist (physician) and Advanced Practice Providers (APPs -  Physician Assistants and Nurse Practitioners) who all work together to provide you with the care you need, when you need it.  We recommend signing up for the patient portal called MyChart.  Sign up information is provided on this After Visit Summary.  MyChart is used to connect with patients for Virtual Visits (Telemedicine).  Patients are able to view lab/test results, encounter notes, upcoming appointments, etc.  Non-urgent messages can be sent to your provider as well.   To learn more about what you can do with MyChart, go to [email or web address].    Your next appointment:   4 month(s)  The format for your next appointment:   In Person  Provider:   Redell Leiter, MD    Other Instructions none  Important Information About Sugar

## 2024-01-11 NOTE — Progress Notes (Signed)
 Cardiology Office Note:    Date:  01/11/2024   ID:  Ann Diaz, DOB Aug 25, 1945, MRN [id number]  PCP:  Ann Tracey DELENA, MD  Cardiologist:  Ann JONELLE Crape, MD   Referring MD: Ann Tracey DELENA, MD    ASSESSMENT:    1. Essential hypertension   2. Primary hypertension   3. New onset atrial fibrillation (HCC)   4. Moderate aortic regurgitation   5. Moderate mitral regurgitation    PLAN:    In order of problems listed above:  Paroxysmal atrial fibrillation:I discussed with the patient atrial fibrillation, disease process. Management and therapy including rate and rhythm control, anticoagulation benefits and potential risks were discussed extensively with the patient. Patient had multiple questions which were answered to patient's satisfaction. In view of the significant atrial fibrillation burden I will initiate her on amiodarone once I get baseline blood work report.  She will have blood work today.  Then I will initiate on amiodarone 400 twice a day for a week then once a day for the next week and then 200 mg daily.  Benefits and potential risks of the medication explained and questions were answered to her satisfaction.  She will have a baseline chest x-ray also. Essential hypertension: Blood pressure stable and diet was emphasized.  Lifestyle modification urged. Moderate mitral regurgitation: Stable and we will continue to monitor. Patient will be seen in follow-up appointment in 6 weeks or earlier if the patient has any concerns.    Medication Adjustments/Labs and Tests Ordered: Current medicines are reviewed at length with the patient today.  Concerns regarding medicines are outlined above.  Orders Placed This Encounter  Procedures   DG Chest 2 View   Comprehensive metabolic panel with GFR   TSH   No orders of the defined types were placed in this encounter.    No chief complaint on file.    History of Present Illness:    Ann Diaz is a 78 y.o. female.  Patient  has past medical history of paroxysmal atrial fibrillation, essential hypertension.  She gives a history of generalized weakness.  Event monitor has revealed significant amounts of paroxysms of atrial fibrillation.  She is here for to discuss this.  She feels palpitations at times.  No chest pain orthopnea or PND.  Past Medical History:  Diagnosis Date   Anxiety    Arthritis    Essential hypertension 10/28/2023   GERD (gastroesophageal reflux disease)    Hypertension    Lumbar pseudoarthrosis 07/31/2021   New onset atrial fibrillation (HCC) 10/28/2023   PONV (postoperative nausea and vomiting)    Spondylolisthesis, lumbar region 11/25/2020    Past Surgical History:  Procedure Laterality Date   BACK SURGERY     11/25/2020   CHOLECYSTECTOMY     EYE SURGERY     FRACTURE SURGERY Left    left wrist   TUBAL LIGATION      Current Medications: Current Meds  Medication Sig   ALPRAZolam  (XANAX ) 0.5 MG tablet Take 0.5 mg by mouth 2 (two) times daily as needed for anxiety.   apixaban (ELIQUIS) 5 MG TABS tablet Take 1 tablet (5 mg total) by mouth 2 (two) times daily.   Biotin 5000 MCG CAPS Take 5,000 mcg by mouth daily.   Calcium Carb-Cholecalciferol (CALCIUM 600+D3 PO) Take 1 tablet by mouth in the morning.   cyclobenzaprine  (FLEXERIL ) 5 MG tablet Take 1 tablet (5 mg total) by mouth 3 (three) times daily as needed for muscle spasms.  diclofenac (VOLTAREN) 75 MG EC tablet Take 75 mg by mouth 2 (two) times daily.   docusate sodium  (COLACE) 100 MG capsule Take 1 capsule (100 mg total) by mouth 2 (two) times daily. (Patient taking differently: Take 100 mg by mouth daily as needed for mild constipation or moderate constipation.)   famotidine  (PEPCID ) 20 MG tablet Take 20 mg by mouth daily as needed for heartburn or indigestion.   metoprolol  succinate (TOPROL -XL) 50 MG 24 hr tablet Take 1 tablet (50 mg total) by mouth daily. Take with or immediately following a meal.   Omega-3 Fatty Acids (FISH  OIL) 1000 MG CAPS Take 1,000 mg by mouth in the morning.   oxymetazoline  (AFRIN) 0.05 % nasal spray Place 1 spray into both nostrils at bedtime.   Turmeric 500 MG CAPS Take 500 mg by mouth daily.     Allergies:   Sulfa antibiotics, Codeine, Morphine , Meperidine, and Tramadol hcl   Social History   Socioeconomic History   Marital status: Married    Spouse name: Not on file   Number of children: Not on file   Years of education: Not on file   Highest education level: Not on file  Occupational History   Not on file  Tobacco Use   Smoking status: Never   Smokeless tobacco: Never  Vaping Use   Vaping status: Never Used  Substance and Sexual Activity   Alcohol use: Never   Drug use: Never   Sexual activity: Not on file  Other Topics Concern   Not on file  Social History Narrative   Not on file   Social Drivers of Health   Financial Resource Strain: Not on file  Food Insecurity: Low Risk  (01/12/2023)   Received from [facility]   Hunger Vital Sign    Within the past 12 months, you worried that your food would run out before you got money to buy more: Never true    Within the past 12 months, the food you bought just didn't last and you didn't have money to get more. : Never true  Transportation Needs: No Transportation Needs (01/12/2023)   Received from [facility]    In the past 12 months, has lack of reliable transportation kept you from medical appointments, meetings, work or from getting things needed for daily living? : No  Physical Activity: Not on file  Stress: Not on file  Social Connections: Unknown (07/29/2021)   Received from [facility]   Social Network    Social Network: Not on file     Family History: The patient's family history includes Diabetes in her brother; Heart attack in her brother; Hypertension in her brother, father, and mother. There is no history of Cancer.  ROS:   Please see the history of present illness.    All other  systems reviewed and are negative.  EKGs/Labs/Other Studies Reviewed:    The following studies were reviewed today: I discussed my findings with the patient at length    Recent Labs: 10/28/2023: ALT 14; TSH 0.948 12/27/2023: BUN 22; Creatinine, Ser 0.86; Hemoglobin 12.4; Platelets 210; Potassium 5.1; Sodium 142  Recent Lipid Panel No results found for: CHOL, TRIG, HDL, CHOLHDL, VLDL, LDLCALC, LDLDIRECT  Physical Exam:    VS:  BP (!) 140/82   Pulse 68   Ht 5' 10 (1.778 m)   Wt 163 lb 3.2 oz (74 kg)   SpO2 97%   BMI 23.42 kg/m     Wt Readings from  Last 3 Encounters:  01/11/24 163 lb 3.2 oz (74 kg)  12/15/23 160 lb 9.6 oz (72.8 kg)  11/04/23 163 lb 9.6 oz (74.2 kg)     GEN: Patient is in no acute distress HEENT: Normal NECK: No JVD; No carotid bruits LYMPHATICS: No lymphadenopathy CARDIAC: Hear sounds regular, 2/6 systolic murmur at the apex. RESPIRATORY:  Clear to auscultation without rales, wheezing or rhonchi  ABDOMEN: Soft, non-tender, non-distended MUSCULOSKELETAL:  No edema; No deformity  SKIN: Warm and dry NEUROLOGIC:  Alert and oriented x 3 PSYCHIATRIC:  Normal affect   Signed, Ann JONELLE Crape, MD  01/11/2024 4:23 PM    [facility]

## 2024-01-12 ENCOUNTER — Ambulatory Visit (INDEPENDENT_AMBULATORY_CARE_PROVIDER_SITE_OTHER)
Admission: RE | Admit: 2024-01-12 | Discharge: 2024-01-12 | Disposition: A | Source: Ambulatory Visit | Attending: Cardiology | Admitting: Cardiology

## 2024-01-12 ENCOUNTER — Ambulatory Visit: Payer: Self-pay | Admitting: Cardiology

## 2024-01-12 DIAGNOSIS — I4891 Unspecified atrial fibrillation: Secondary | ICD-10-CM

## 2024-01-12 LAB — COMPREHENSIVE METABOLIC PANEL WITH GFR
ALT: 19 IU/L (ref 0–32)
AST: 24 IU/L (ref 0–40)
Albumin: 4.5 g/dL (ref 3.8–4.8)
Alkaline Phosphatase: 88 IU/L (ref 49–135)
BUN/Creatinine Ratio: 26 (ref 12–28)
BUN: 20 mg/dL (ref 8–27)
Bilirubin Total: 0.4 mg/dL (ref 0.0–1.2)
CO2: 22 mmol/L (ref 20–29)
Calcium: 10.1 mg/dL (ref 8.7–10.3)
Chloride: 105 mmol/L (ref 96–106)
Creatinine, Ser: 0.78 mg/dL (ref 0.57–1.00)
Globulin, Total: 2.2 g/dL (ref 1.5–4.5)
Glucose: 95 mg/dL (ref 70–99)
Potassium: 4.9 mmol/L (ref 3.5–5.2)
Sodium: 144 mmol/L (ref 134–144)
Total Protein: 6.7 g/dL (ref 6.0–8.5)
eGFR: 79 mL/min/1.73 (ref >59)

## 2024-01-12 LAB — TSH: TSH: 1.29 u[IU]/mL (ref 0.450–4.500)

## 2024-01-12 MED ORDER — AMIODARONE HCL 200 MG PO TABS
ORAL_TABLET | ORAL | 3 refills | Status: DC
Start: 2024-01-12 — End: 2024-01-18

## 2024-01-12 NOTE — Telephone Encounter (Signed)
Results reviewed with pt as per Dr. Revankar's note.  Pt verbalized understanding and had no additional questions. Routed to PCP.  

## 2024-01-13 DIAGNOSIS — Z23 Encounter for immunization: Secondary | ICD-10-CM | POA: Diagnosis not present

## 2024-01-14 DIAGNOSIS — M542 Cervicalgia: Secondary | ICD-10-CM | POA: Diagnosis not present

## 2024-01-17 ENCOUNTER — Telehealth: Payer: Self-pay

## 2024-01-17 DIAGNOSIS — I4891 Unspecified atrial fibrillation: Secondary | ICD-10-CM

## 2024-01-17 NOTE — Telephone Encounter (Signed)
 Pt states that since starting Amiodarone she has been itching all over and vomiting. Advised to stop the medication. Please advise.

## 2024-01-18 DIAGNOSIS — M542 Cervicalgia: Secondary | ICD-10-CM | POA: Diagnosis not present

## 2024-01-18 NOTE — Addendum Note (Signed)
 Addended by: ONEITA BERLINER on: 01/18/2024 05:04 PM   Modules accepted: Orders

## 2024-01-18 NOTE — Telephone Encounter (Signed)
 Recommendations reviewed with pt as per Dr. Hobson note.  Pt verbalized understanding and had no additional questions.

## 2024-01-19 DIAGNOSIS — M542 Cervicalgia: Secondary | ICD-10-CM | POA: Diagnosis not present

## 2024-01-20 DIAGNOSIS — M5412 Radiculopathy, cervical region: Secondary | ICD-10-CM | POA: Diagnosis not present

## 2024-01-31 ENCOUNTER — Ambulatory Visit: Attending: Cardiology | Admitting: Cardiology

## 2024-01-31 ENCOUNTER — Encounter: Payer: Self-pay | Admitting: Cardiology

## 2024-01-31 VITALS — BP 110/78 | HR 76 | Ht 70.0 in | Wt 160.4 lb

## 2024-01-31 DIAGNOSIS — D6869 Other thrombophilia: Secondary | ICD-10-CM | POA: Diagnosis not present

## 2024-01-31 DIAGNOSIS — I480 Paroxysmal atrial fibrillation: Secondary | ICD-10-CM | POA: Diagnosis not present

## 2024-01-31 DIAGNOSIS — I10 Essential (primary) hypertension: Secondary | ICD-10-CM | POA: Diagnosis not present

## 2024-01-31 MED ORDER — APIXABAN 5 MG PO TABS
5.0000 mg | ORAL_TABLET | Freq: Two times a day (BID) | ORAL | 0 refills | Status: DC
Start: 2024-01-31 — End: 2024-02-08

## 2024-01-31 NOTE — Patient Instructions (Signed)
 Medication Instructions:  Your physician has recommended you make the following change in your medication:  START Flecainide 50 mg twice daily  *If you need a refill on your cardiac medications before your next appointment, please call your pharmacy*  Lab Work: None ordered  Testing/Procedures: None ordered  Follow-Up: At [facility], you and your health needs are our priority.  As part of our continuing mission to provide you with exceptional heart care, our providers are all part of one team.  This team includes your primary Cardiologist (physician) and Advanced Practice Providers or APPs (Physician Assistants and Nurse Practitioners) who all work together to provide you with the care you need, when you need it.  Your physician recommends that you schedule a follow-up appointment in: 2 weeks for a nurse visit EKG.   Your next appointment:   3 month(s)  Provider:   Soyla Norton, MD     Thank you for choosing [facility]!!   Ann Domino, RN [phone]   Other Instructions  Flecainide Tablets What is this medication? FLECAINIDE (FLEK a nide) prevents and treats a fast or irregular heartbeat (arrhythmia). It is often used to treat a type of arrhythmia known as AFib (atrial fibrillation). It works by slowing down overactive electric signals in the heart, which stabilizes your heart rhythm. It belongs to a group of medications called antiarrhythmics. This medicine may be used for other purposes; ask your health care provider or pharmacist if you have questions. COMMON BRAND NAME(S): Tambocor What should I tell my care team before I take this medication? They need to know if you have any of these conditions: High or low levels of potassium in the blood Heart disease including heart rhythm and heart rate problems Kidney disease Liver disease Recent heart attack An unusual or allergic reaction to flecainide, other medications, foods, dyes, or  preservatives Pregnant or trying to get pregnant Breastfeeding How should I use this medication? Take this medication by mouth with a glass of water. Take it as directed on the prescription label at the same time every day. You can take it with or without food. If it upsets your stomach, take it with food. Do not take your medication more often than directed. Do not stop taking this medication suddenly. This may cause serious, heart-related side effects. If your care team wants you to stop the medication, the dose may be slowly lowered over time to avoid any side effects. Talk to your care team about the use of this medication in children. While it may be prescribed for children as young as 1 year for selected conditions, precautions do apply. Overdosage: If you think you have taken too much of this medicine contact a poison control center or emergency room at once. NOTE: This medicine is only for you. Do not share this medicine with others. What if I miss a dose? If you miss a dose, take it as soon as you can. If it is almost time for your next dose, take only that dose. Do not take double or extra doses. What may interact with this medication? Do not take this medication with any of the following: Amoxapine Arsenic trioxide Certain antibiotics, such as clarithromycin, erythromycin, gatifloxacin, gemifloxacin, levofloxacin, moxifloxacin, sparfloxacin, or troleandomycin Certain antidepressants, called tricyclic antidepressants such as amitriptyline, imipramine, or nortriptyline Certain medications for irregular heartbeat, such as disopyramide, encainide, moricizine, procainamide, propafenone, and quinidine Cisapride Delavirdine Droperidol  Haloperidol [place] Imatinib Levomethadyl Maprotiline Medications for malaria, such as chloroquine and halofantrine Pentamidine Phenothiazines,  such as chlorpromazine, mesoridazine, prochlorperazine ,  thioridazine Pimozide Quinine Ranolazine Ritonavir Sertindole This medication may also interact with the following: Cimetidine Dofetilide Medications for angina or blood pressure Medications for irregular heartbeat, such as amiodarone and digoxin Ziprasidone This list may not describe all possible interactions. Give your health care provider a list of all the medicines, herbs, non-prescription drugs, or dietary supplements you use. Also tell them if you smoke, drink alcohol, or use illegal drugs. Some items may interact with your medicine. What should I watch for while using this medication? Visit your care team for regular checks on your progress. Because your condition and the use of this medication carries some risk, it is a good idea to carry an identification card, necklace, or bracelet with details of your condition, medications, and care team. Check your blood pressure and pulse rate as directed. Know what your blood pressure and pulse rate should be and when tod contact your care team. Your care team may schedule regular blood tests and electrocardiograms to check your progress. This medication may affect your coordination, reaction time, or judgment. Do not drive or operate machinery until you know how this medication affects you. Sit up or stand slowly to reduce the risk of dizzy or fainting spells. Drinking alcohol with this medication can increase the risk of these side effects. What side effects may I notice from receiving this medication? Side effects that you should report to your care team as soon as possible: Allergic reactions--skin rash, itching, hives, swelling of the face, lips, tongue, or throat Heart failure--shortness of breath, swelling of the ankles, feet, or hands, sudden weight gain, unusual weakness or fatigue Heart rhythm changes--fast or irregular heartbeat, dizziness, feeling faint or lightheaded, chest pain, trouble breathing Liver injury--right upper belly  pain, loss of appetite, nausea, light-colored stool, dark yellow or brown urine, yellowing skin or eyes, unusual weakness or fatigue Side effects that usually do not require medical attention (report to your care team if they continue or are bothersome): Blurry vision Constipation Dizziness Fatigue Headache Nausea Tremors or shaking This list may not describe all possible side effects. Call your doctor for medical advice about side effects. You may report side effects to FDA at 1-800-FDA-1088. Where should I keep my medication? Keep out of the reach of children and pets. Store at room temperature between 15 and 30 degrees C (59 and 86 degrees F). Protect from light. Keep container tightly closed. Throw away any unused medication after the expiration date. NOTE: This sheet is a summary. It may not cover all possible information. If you have questions about this medicine, talk to your doctor, pharmacist, or health care provider.  2024 Elsevier/Gold Standard (2021-10-08 00:00:00)

## 2024-01-31 NOTE — Progress Notes (Signed)
 Electrophysiology Office Note:   Date:  01/31/2024  ID:  Ann Diaz, DOB [birth year], MRN [id number]  Primary Cardiologist: None Primary Heart Failure: None Electrophysiologist: None      History of Present Illness:   Ann Diaz is a 78 y.o. female with h/o atrial fibrillation, hypertension, aortic and mitral regurgitation seen today for  for Electrophysiology evaluation of atrial fibrillation at the request of Rajan Revankar.    Atrial fibrillation was diagnosed recently.  She initially presented to her primary physician with palpitations.  EKG in cardiology clinic showed atrial fibrillation with rapid response.  She has been started on amiodarone.  Discussed the use of AI scribe software for clinical note transcription with the patient, who gave verbal consent to proceed.  History of Present Illness Ann Diaz is a 78 year old female with atrial fibrillation who presents for management of her condition. She was referred by Dr. Rosea for evaluation of her atrial fibrillation.  She experiences episodes of atrial fibrillation, sometimes without awareness, but often with symptoms of rapid heart rate, fatigue, and shortness of breath. These episodes can last a long time and have been exacerbated by recent stress, such as her daughter's wedding. She wants to prevent these episodes as they impact her energy levels and sleep. She is currently taking Eliquis.  In addition to atrial fibrillation, she suffers from significant shoulder arthritis pain. She describes the pain as severe and has been using an over-the-counter eight-hour arthritis tablet with limited relief. She previously used Voltaren gel, which provided some relief, but she is unsure about its safety with her current medication regimen.  Her family history reveals that her sister and mother also have atrial fibrillation. Her mother had a pacemaker.   Review of systems complete and found to be negative unless listed in  HPI.   EP Information / Studies Reviewed:    EKG is not ordered today. EKG from 12/15/2023 reviewed which showed sinus rhythm        Risk Assessment/Calculations:    CHA2DS2-VASc Score = 4   This indicates a 4.8% annual risk of stroke. The patient's score is based upon: CHF History: 0 HTN History: 1 Diabetes History: 0 Stroke History: 0 Vascular Disease History: 0 Age Score: 2 Gender Score: 1            Physical Exam:   VS:  BP 110/78 (BP Location: Left Arm, Patient Position: Sitting, Cuff Size: Normal)   Pulse 76   Ht 5' 10 (1.778 m)   Wt 160 lb 6.4 oz (72.8 kg)   SpO2 98%   BMI 23.02 kg/m    Wt Readings from Last 3 Encounters:  01/31/24 160 lb 6.4 oz (72.8 kg)  01/11/24 163 lb 3.2 oz (74 kg)  12/15/23 160 lb 9.6 oz (72.8 kg)     GEN: Well nourished, well developed in no acute distress NECK: No JVD; No carotid bruits CARDIAC: Regular rate and rhythm, no murmurs, rubs, gallops RESPIRATORY:  Clear to auscultation without rales, wheezing or rhonchi  ABDOMEN: Soft, non-tender, non-distended EXTREMITIES:  No edema; No deformity   ASSESSMENT AND PLAN:    1.  Paroxysmal atrial fibrillation: Has continued to have episodes of atrial fibrillation.  She feels overly in A-fib.  We discussed versus rate control.  At this time, she would prefer rhythm control.  Aubriella Perezgarcia start flecainide 50 mg twice daily.  She did not tolerate amiodarone.  Javonte Elenes have her come back in 2 weeks for an EKG.  2.  Secondary hypercoagulable state: On Eliquis  3.  Hypertension: Well-controlled  Follow up with EP Team in 3 months  Signed, Talise Sligh Gladis Norton, MD

## 2024-02-01 ENCOUNTER — Other Ambulatory Visit: Payer: Self-pay | Admitting: Emergency Medicine

## 2024-02-01 MED ORDER — FLECAINIDE ACETATE 50 MG PO TABS: 50.0000 mg | ORAL_TABLET | Freq: Two times a day (BID) | ORAL | 3 refills | Status: DC

## 2024-02-08 ENCOUNTER — Encounter: Payer: Self-pay | Admitting: Cardiology

## 2024-02-08 ENCOUNTER — Ambulatory Visit: Attending: Cardiology | Admitting: Cardiology

## 2024-02-08 VITALS — BP 138/68 | HR 61 | Ht 70.0 in | Wt 160.8 lb

## 2024-02-08 DIAGNOSIS — Z5181 Encounter for therapeutic drug level monitoring: Secondary | ICD-10-CM | POA: Insufficient documentation

## 2024-02-08 DIAGNOSIS — R0609 Other forms of dyspnea: Secondary | ICD-10-CM

## 2024-02-08 DIAGNOSIS — I340 Nonrheumatic mitral (valve) insufficiency: Secondary | ICD-10-CM

## 2024-02-08 DIAGNOSIS — Z79899 Other long term (current) drug therapy: Secondary | ICD-10-CM

## 2024-02-08 DIAGNOSIS — I10 Essential (primary) hypertension: Secondary | ICD-10-CM | POA: Diagnosis not present

## 2024-02-08 DIAGNOSIS — I4891 Unspecified atrial fibrillation: Secondary | ICD-10-CM | POA: Diagnosis not present

## 2024-02-08 HISTORY — DX: Encounter for therapeutic drug level monitoring: Z51.81

## 2024-02-08 HISTORY — DX: Other forms of dyspnea: R06.09

## 2024-02-08 NOTE — Progress Notes (Signed)
 Cardiology Office Note:    Date:  02/08/2024   ID:  Ann Diaz, DOB Nov 03, 1945, MRN [id number]  PCP:  Fernand Tracey DELENA, MD  Cardiologist:  Jennifer JONELLE Crape, MD   Referring MD: Fernand Tracey DELENA, MD    ASSESSMENT:    1. Essential hypertension   2. Moderate mitral regurgitation   3. New onset atrial fibrillation (HCC)   4. Encounter for monitoring flecainide  therapy   5. DOE (dyspnea on exertion)    PLAN:    In order of problems listed above:  Coronary artery disease: Primary prevention stressed with the patient.  Importance of compliance with diet medication stressed and patient verbalized standing.  She was advised to ambulate to the best of her ability. She has dyspnea on exertion: She is on amiodarone  therapy and will do CT coronary angiography with FFR Paroxysmal atrial fibrillation:I discussed with the patient atrial fibrillation, disease process. Management and therapy including rate and rhythm control, anticoagulation benefits and potential risks were discussed extensively with the patient. Patient had multiple questions which were answered to patient's satisfaction. Flecainide  therapy: Benefits risks explained.  CT coronary angiography for dyspnea on exertion and also to assess quality and anatomy in view of flecainide  therapy. Mixed dyslipidemia: Lipid-lowering medications followed by primary care.  Goal LDL less than 60. Patient will be seen in follow-up appointment in 6 months or earlier if the patient has any concerns.    Medication Adjustments/Labs and Tests Ordered: Current medicines are reviewed at length with the patient today.  Concerns regarding medicines are outlined above.  Orders Placed This Encounter  Procedures   EKG 12-Lead   No orders of the defined types were placed in this encounter.    No chief complaint on file.    History of Present Illness:    Ann Diaz is a 78 y.o. female.  Patient has past medical history of essential hypertension,  moderate mitral regurgitation, paroxysmal atrial fibrillation on anticoagulation.  She did not tolerate amiodarone .  She denies any chest pain orthopnea or PND.  She takes care of her activities of daily living.  She has some dyspnea on exertion.  At the time of my evaluation, the patient is alert awake oriented and in no distress.  Past Medical History:  Diagnosis Date   Anxiety    Arthritis    Essential hypertension 10/28/2023   GERD (gastroesophageal reflux disease)    Hypertension    Lumbar pseudoarthrosis 07/31/2021   Moderate mitral regurgitation 01/11/2024   New onset atrial fibrillation (HCC) 10/28/2023   PONV (postoperative nausea and vomiting)    Spondylolisthesis, lumbar region 11/25/2020    Past Surgical History:  Procedure Laterality Date   BACK SURGERY     11/25/2020   CHOLECYSTECTOMY     EYE SURGERY     FRACTURE SURGERY Left    left wrist   TUBAL LIGATION      Current Medications: Current Meds  Medication Sig   ALPRAZolam  (XANAX ) 0.5 MG tablet Take 0.5 mg by mouth 2 (two) times daily as needed for anxiety.   apixaban  (ELIQUIS ) 5 MG TABS tablet Take 1 tablet (5 mg total) by mouth 2 (two) times daily.   Biotin 5000 MCG CAPS Take 5,000 mcg by mouth daily.   Calcium Carb-Cholecalciferol (CALCIUM 600+D3 PO) Take 1 tablet by mouth in the morning.   cyclobenzaprine  (FLEXERIL ) 5 MG tablet Take 1 tablet (5 mg total) by mouth 3 (three) times daily as needed for muscle spasms.   docusate sodium  (  COLACE) 100 MG capsule Take 1 capsule (100 mg total) by mouth 2 (two) times daily. (Patient taking differently: Take 100 mg by mouth daily as needed for mild constipation or moderate constipation.)   famotidine  (PEPCID ) 20 MG tablet Take 20 mg by mouth daily as needed for heartburn or indigestion.   flecainide  (TAMBOCOR ) 50 MG tablet Take 1 tablet (50 mg total) by mouth 2 (two) times daily.   metoprolol  succinate (TOPROL -XL) 50 MG 24 hr tablet Take 1 tablet (50 mg total) by mouth  daily. Take with or immediately following a meal.   Omega-3 Fatty Acids (FISH OIL) 1000 MG CAPS Take 1,000 mg by mouth in the morning.   oxymetazoline  (AFRIN) 0.05 % nasal spray Place 1 spray into both nostrils at bedtime.   Turmeric 500 MG CAPS Take 500 mg by mouth daily.     Allergies:   Sulfa antibiotics, Codeine, Morphine , Amiodarone , Meperidine, and Tramadol hcl   Social History   Socioeconomic History   Marital status: Married    Spouse name: Not on file   Number of children: Not on file   Years of education: Not on file   Highest education level: Not on file  Occupational History   Not on file  Tobacco Use   Smoking status: Never   Smokeless tobacco: Never  Vaping Use   Vaping status: Never Used  Substance and Sexual Activity   Alcohol use: Never   Drug use: Never   Sexual activity: Not on file  Other Topics Concern   Not on file  Social History Narrative   Not on file   Social Drivers of Health   Financial Resource Strain: Not on file  Food Insecurity: Low Risk  (01/12/2023)   Received from [facility]   Hunger Vital Sign    Within the past 12 months, you worried that your food would run out before you got money to buy more: Never true    Within the past 12 months, the food you bought just didn't last and you didn't have money to get more. : Never true  Transportation Needs: No Transportation Needs (01/12/2023)   Received from [facility]    In the past 12 months, has lack of reliable transportation kept you from medical appointments, meetings, work or from getting things needed for daily living? : No  Physical Activity: Not on file  Stress: Not on file  Social Connections: Unknown (07/29/2021)   Received from [facility]   Social Network    Social Network: Not on file     Family History: The patient's family history includes Diabetes in her brother; Heart attack in her brother; Hypertension in her brother, father, and mother. There  is no history of Cancer.  ROS:   Please see the history of present illness.    All other systems reviewed and are negative.  EKGs/Labs/Other Studies Reviewed:    The following studies were reviewed today: .SABRAEKG Interpretation Date/Time:  Tuesday February 08 2024 11:25:48 EST Ventricular Rate:  61 PR Interval:  198 QRS Duration:  88 QT Interval:  412 QTC Calculation: 414 R Axis:   44  Text Interpretation: Normal sinus rhythm Septal infarct , age undetermined Abnormal ECG When compared with ECG of 15-Dec-2023 16:16, No significant change was found Confirmed by Edwyna Backers [phone]) on 02/08/2024 11:33:30 AM     Recent Labs: 12/27/2023: Hemoglobin 12.4; Platelets 210 01/11/2024: ALT 19; BUN 20; Creatinine, Ser 0.78; Potassium 4.9; Sodium 144; TSH 1.290  Recent Lipid Panel No results found for: CHOL, TRIG, HDL, CHOLHDL, VLDL, LDLCALC, LDLDIRECT  Physical Exam:    VS:  BP 138/68   Pulse 61   Ht 5' 10 (1.778 m)   Wt 160 lb 12.8 oz (72.9 kg)   SpO2 97%   BMI 23.07 kg/m     Wt Readings from Last 3 Encounters:  02/08/24 160 lb 12.8 oz (72.9 kg)  01/31/24 160 lb 6.4 oz (72.8 kg)  01/11/24 163 lb 3.2 oz (74 kg)     GEN: Patient is in no acute distress HEENT: Normal NECK: No JVD; No carotid bruits LYMPHATICS: No lymphadenopathy CARDIAC: Hear sounds regular, 2/6 systolic murmur at the apex. RESPIRATORY:  Clear to auscultation without rales, wheezing or rhonchi  ABDOMEN: Soft, non-tender, non-distended MUSCULOSKELETAL:  No edema; No deformity  SKIN: Warm and dry NEUROLOGIC:  Alert and oriented x 3 PSYCHIATRIC:  Normal affect   Signed, Jennifer JONELLE Crape, MD  02/08/2024 11:47 AM    [facility]

## 2024-02-08 NOTE — Patient Instructions (Addendum)
 Medication Instructions:  Your physician recommends that you continue on your current medications as directed. Please refer to the Current Medication list given to you today.  *If you need a refill on your cardiac medications before your next appointment, please call your pharmacy*   Lab Work: None ordered If you have labs (blood work) drawn today and your tests are completely normal, you will receive your results only by: MyChart Message (if you have MyChart) OR A paper copy in the mail If you have any lab test that is abnormal or we need to change your treatment, we will call you to review the results.   Testing/Procedures:   Your cardiac CT will be scheduled at one of the below locations:   [facility] [address]  [place], [place] [address]  If scheduled at the [facility] at [address], please enter the parking lot using the [address] entrance and use the FREE valet service at the patient drop-off area. Enter the building and check-in with registration on the main floor.  Please follow these instructions carefully (unless otherwise directed):  An IV will be required for this test and Nitroglycerin will be given.  Hold all erectile dysfunction medications at least 3 days (72 hrs) prior to test. (Ie viagra, cialis, sildenafil, tadalafil, etc)   On the Night Before the Test: Be sure to Drink plenty of water. Do not consume any caffeinated/decaffeinated beverages or chocolate 12 hours prior to your test. Do not take any antihistamines 12 hours prior to your test.  On the Day of the Test: Drink plenty of water until 1 hour prior to the test. Do not eat any food 1 hour prior to test. You may take your regular medications prior to the test.  FEMALES- please wear underwire-free bra if available, avoid dresses & tight clothing   After the Test: Drink plenty of water. After receiving IV contrast, you may experience a mild  flushed feeling. This is normal. On occasion, you may experience a mild rash up to 24 hours after the test. This is not dangerous. If this occurs, you can take Benadryl 25 mg, Zyrtec, Claritin, or Allegra and increase your fluid intake. (Patients taking Tikosyn should avoid Benadryl, and may take Zyrtec, Claritin, or Allegra) If you experience trouble breathing, this can be serious. If it is severe call 911 IMMEDIATELY. If it is mild, please call our office.  We will call to schedule your test 2-4 weeks out understanding that some insurance companies will need an authorization prior to the service being performed.   For more information and frequently asked questions, please visit our website : [email or web address]  For non-scheduling related questions, please contact the cardiac imaging nurse navigator should you have any questions/concerns: Cardiac Imaging Nurse Navigators Direct Office Dial: [phone]   For scheduling needs, including cancellations and rescheduling, please call Brittany, [phone].    Follow-Up: At [facility], you and your health needs are our priority.  As part of our continuing mission to provide you with exceptional heart care, we have created designated Provider Care Teams.  These Care Teams include your primary Cardiologist (physician) and Advanced Practice Providers (APPs -  Physician Assistants and Nurse Practitioners) who all work together to provide you with the care you need, when you need it.  We recommend signing up for the patient portal called MyChart.  Sign up information is provided on this After Visit Summary.  MyChart is used to connect with  patients for Virtual Visits (Telemedicine).  Patients are able to view lab/test results, encounter notes, upcoming appointments, etc.  Non-urgent messages can be sent to your provider as well.   To learn more about what you can do with MyChart, go to [email or web address].    Your next  appointment:   9 month(s)  The format for your next appointment:   In Person  Provider:   Jennifer Crape, MD    Other Instructions none  Important Information About Sugar

## 2024-02-14 ENCOUNTER — Ambulatory Visit

## 2024-02-15 ENCOUNTER — Ambulatory Visit: Attending: Cardiology

## 2024-02-15 VITALS — BP 120/78 | HR 59 | Ht 65.0 in | Wt 163.4 lb

## 2024-02-15 DIAGNOSIS — Z79899 Other long term (current) drug therapy: Secondary | ICD-10-CM

## 2024-02-15 NOTE — Patient Instructions (Signed)
   Nurse Visit   Date of Encounter: 02/15/2024 ID: Ann Diaz, DOB [birth year], MRN [id number]  PCP:  Fernand Tracey DELENA, MD   [facility] Providers Cardiologist:  Jennifer Crape, MD }     Visit Details   VS:  There were no vitals taken for this visit. , BMI There is no height or weight on file to calculate BMI.  Wt Readings from Last 3 Encounters:  02/08/24 160 lb 12.8 oz (72.9 kg)  01/31/24 160 lb 6.4 oz (72.8 kg)  01/11/24 163 lb 3.2 oz (74 kg)     Reason for visit: repeat EKG since starting Flecainide   Performed today: Vitals, EKG, Provider consulted:Dr. Krasowski, and Education Changes (medications, testing, etc.) : None Length of Visit: 20 minutes    Medications Adjustments/Labs and Tests Ordered: Orders Placed This Encounter  Procedures   EKG 12-Lead   No orders of the defined types were placed in this encounter.    Signed, Alan LITTIE Leff, RN  02/15/2024 1:30 PM

## 2024-02-28 ENCOUNTER — Encounter (HOSPITAL_COMMUNITY): Payer: Self-pay

## 2024-03-01 ENCOUNTER — Other Ambulatory Visit: Payer: Self-pay | Admitting: Cardiology

## 2024-03-01 ENCOUNTER — Ambulatory Visit (HOSPITAL_BASED_OUTPATIENT_CLINIC_OR_DEPARTMENT_OTHER)
Admission: RE | Admit: 2024-03-01 | Discharge: 2024-03-01 | Disposition: A | Discharge status: Home / Self Care | Source: Ambulatory Visit | Attending: Cardiology | Admitting: Cardiology

## 2024-03-01 ENCOUNTER — Ambulatory Visit (HOSPITAL_COMMUNITY): Admission: RE | Admit: 2024-03-01 | Discharge: 2024-03-01 | Attending: Cardiology | Admitting: Cardiology

## 2024-03-01 DIAGNOSIS — R0609 Other forms of dyspnea: Secondary | ICD-10-CM

## 2024-03-01 DIAGNOSIS — R931 Abnormal findings on diagnostic imaging of heart and coronary circulation: Secondary | ICD-10-CM

## 2024-03-01 DIAGNOSIS — I700 Atherosclerosis of aorta: Secondary | ICD-10-CM | POA: Diagnosis not present

## 2024-03-01 DIAGNOSIS — I2510 Atherosclerotic heart disease of native coronary artery without angina pectoris: Secondary | ICD-10-CM | POA: Diagnosis not present

## 2024-03-01 MED ORDER — NITROGLYCERIN 0.4 MG SL SUBL
0.8000 mg | SUBLINGUAL_TABLET | Freq: Once | SUBLINGUAL | Status: AC
  Administered 2024-03-01: 12:00:00 0.8 mg via SUBLINGUAL

## 2024-03-01 MED ORDER — IOHEXOL 350 MG/ML SOLN
100.0000 mL | Freq: Once | INTRAVENOUS | Status: AC | PRN
  Administered 2024-03-01: 12:00:00 100 mL via INTRAVENOUS

## 2024-03-02 ENCOUNTER — Ambulatory Visit: Attending: Cardiology | Admitting: Cardiology

## 2024-03-02 ENCOUNTER — Ambulatory Visit: Payer: Self-pay | Admitting: Cardiology

## 2024-03-02 ENCOUNTER — Encounter: Payer: Self-pay | Admitting: Cardiology

## 2024-03-02 VITALS — BP 156/80 | HR 78 | Ht 70.0 in | Wt 160.0 lb

## 2024-03-02 DIAGNOSIS — R931 Abnormal findings on diagnostic imaging of heart and coronary circulation: Secondary | ICD-10-CM

## 2024-03-02 DIAGNOSIS — R0609 Other forms of dyspnea: Secondary | ICD-10-CM

## 2024-03-02 DIAGNOSIS — I2510 Atherosclerotic heart disease of native coronary artery without angina pectoris: Secondary | ICD-10-CM | POA: Diagnosis not present

## 2024-03-02 DIAGNOSIS — I10 Essential (primary) hypertension: Secondary | ICD-10-CM

## 2024-03-02 HISTORY — DX: Other forms of dyspnea: R06.09

## 2024-03-02 MED ORDER — DRONEDARONE HCL 400 MG PO TABS: 400.0000 mg | ORAL_TABLET | Freq: Two times a day (BID) | ORAL | 12 refills | Status: AC

## 2024-03-02 MED ORDER — ROSUVASTATIN CALCIUM 20 MG PO TABS: 20.0000 mg | ORAL_TABLET | Freq: Every day | ORAL | 3 refills | Status: AC

## 2024-03-02 NOTE — Patient Instructions (Addendum)
 Medication Instructions:  Your physician has recommended you make the following change in your medication:   Stop Flecainide   Start Multaq  400 mg twice daily  *If you need a refill on your cardiac medications before your next appointment, please call your pharmacy*   Lab Work: Your physician recommends that you have a CBC and BMP today in the office.  If you have labs (blood work) drawn today and your tests are completely normal, you will receive your results only by: MyChart Message (if you have MyChart) OR A paper copy in the mail If you have any lab test that is abnormal or we need to change your treatment, we will call you to review the results.   Testing/Procedures:  [place] [facility] [place] [address] [place] [address] Dept: [phone] Loc: [phone]  Ann Diaz  03/02/2024  You are scheduled for a Cardiac Catheterization on Tuesday, January 30 with Dr. Gordy Bergamo.  1. Please arrive at the [facility] (Main Entrance A) at [facility]: [address] [place], [place] [address] at 8:30 AM (This time is 2 hour(s) before your procedure to ensure your preparation).   Free valet parking service is available. You will check in at ADMITTING. The support person will be asked to wait in the waiting room.  It is OK to have someone drop you off and come back when you are ready to be discharged.    Special note: Every effort is made to have your procedure done on time. Please understand that emergencies sometimes delay scheduled procedures.  2. Diet: Nothing to eat after midnight.   3. Hydration: You need to be well hydrated before your procedure. On December 30, you may drink approved liquids (see below) until 2 hours before the procedure, with 16 oz of water as your last intake.   List of approved liquids water, clear juice, clear tea, black coffee, fruit juices, non-citric and  without pulp, carbonated beverages, Gatorade, Kool -Aid, plain Jello-O and plain ice popsicles.  4. Labs: You had labs done today in the office.  5. Medication instructions in preparation for your procedure:   Contrast Allergy: No  Stop taking Eliquis  (Apixiban) on Sunday, December 28.   On the morning of your procedure, take your Aspirin 81 mg and any morning medicines NOT listed above.  You may use sips of water.  6. Plan to go home the same day, you will only stay overnight if medically necessary. 7. Bring a current list of your medications and current insurance cards. 8. You MUST have a responsible person to drive you home. 9. Someone MUST be with you the first 24 hours after you arrive home or your discharge will be delayed. 10. Please wear clothes that are easy to get on and off and wear slip-on shoes.  Thank you for allowing us  to care for you!   -- [facility]    Follow-Up: At [facility], you and your health needs are our priority.  As part of our continuing mission to provide you with exceptional heart care, we have created designated Provider Care Teams.  These Care Teams include your primary Cardiologist (physician) and Advanced Practice Providers (APPs -  Physician Assistants and Nurse Practitioners) who all work together to provide you with the care you need, when you need it.  We recommend signing up for the patient portal called MyChart.  Sign  up information is provided on this After Visit Summary.  MyChart is used to connect with patients for Virtual Visits (Telemedicine).  Patients are able to view lab/test results, encounter notes, upcoming appointments, etc.  Non-urgent messages can be sent to your provider as well.   To learn more about what you can do with MyChart, go to [email or web address].    Your next appointment:   6 week(s)  The format for your next appointment:   In Person  Provider:   Jennifer Crape, MD     Other Instructions none  Important Information About Sugar

## 2024-03-02 NOTE — H&P (View-Only) (Signed)
 Cardiology Office Note:    Date:  03/02/2024   ID:  Ann Diaz, DOB March 23, 1945, MRN [id number]  PCP:  Fernand Tracey DELENA, MD  Cardiologist:  Jennifer JONELLE Crape, MD   Referring MD: Fernand Tracey DELENA, MD    ASSESSMENT:    1. Essential hypertension   2. Dyspnea on exertion   3. Coronary artery disease involving native coronary artery of native heart, unspecified whether angina present    PLAN:    In order of problems listed above:  Coronary artery disease: Abnormal CT coronary angiography: Dyspnea on exertion: I discussed my findings with the patient extensively.  The following recommendations were made to the patient.  She was advised to take a coated baby aspirin on a daily basis.I discussed coronary angiography and left heart catheterization with the patient at extensive length. Procedure, benefits and potential risks were explained. Patient had multiple questions which were answered to the patient's satisfaction. Patient agreed and consented for the procedure. Further recommendations will be made based on the findings of the coronary angiography. In the interim. The patient has any significant symptoms he knows to go to the nearest emergency room. Essential hypertension: Blood pressure stable and diet was emphasized.  She appears anxious about these findings.  I given a prescription for alprazolam  and discussed precautions about using it.  She does not drive her husband drives for her and he has accompanied her for this visit.  She will use it only on a as needed basis. Statin therapy: Recent liver function was point.  I initiated the patient on 20 mg of rosuvastatin  daily. Paroxysmal atrial fibrillation:I discussed with the patient atrial fibrillation, disease process. Management and therapy including rate and rhythm control, anticoagulation benefits and potential risks were discussed extensively with the patient. Patient had multiple questions which were answered to patient's satisfaction.  In  view of the above I have discontinued flecainide  and started Multaq . She will be seen in follow-up appointment after the coronary angiography.   Medication Adjustments/Labs and Tests Ordered: Current medicines are reviewed at length with the patient today.  Concerns regarding medicines are outlined above.  Orders Placed This Encounter  Procedures   EKG 12-Lead   No orders of the defined types were placed in this encounter.    No chief complaint on file.    History of Present Illness:    Ann Diaz is a 78 y.o. female.  Patient has past medical history of paroxysmal atrial fibrillation, moderate mitral regurgitation, essential hypertension, mixed dyslipidemia.  She has paroxysmal atrial fibrillation and could not tolerate amiodarone  so she was on flecainide .  CT coronary angiography has revealed significant obstructive coronary artery disease especially in the left main and right coronary artery.  Patient is concerned about this and was called for evaluation.  At the time of my evaluation, the patient is alert awake oriented and in no distress.  Past Medical History:  Diagnosis Date   Anxiety    Arthritis    Essential hypertension 10/28/2023   GERD (gastroesophageal reflux disease)    Hypertension    Lumbar pseudoarthrosis 07/31/2021   Moderate mitral regurgitation 01/11/2024   New onset atrial fibrillation (HCC) 10/28/2023   PONV (postoperative nausea and vomiting)    Spondylolisthesis, lumbar region 11/25/2020    Past Surgical History:  Procedure Laterality Date   BACK SURGERY     11/25/2020   CHOLECYSTECTOMY     EYE SURGERY     FRACTURE SURGERY Left    left wrist  TUBAL LIGATION      Current Medications: Active Medications[1]   Allergies:   Sulfa antibiotics, Codeine, Morphine , Amiodarone , Meperidine, and Tramadol hcl   Social History   Socioeconomic History   Marital status: Married    Spouse name: Not on file   Number of children: Not on file   Years  of education: Not on file   Highest education level: Not on file  Occupational History   Not on file  Tobacco Use   Smoking status: Never   Smokeless tobacco: Never  Vaping Use   Vaping status: Never Used  Substance and Sexual Activity   Alcohol use: Never   Drug use: Never   Sexual activity: Not on file  Other Topics Concern   Not on file  Social History Narrative   Not on file   Social Drivers of Health   Tobacco Use: Low Risk (03/02/2024)   Patient History    Smoking Tobacco Use: Never    Smokeless Tobacco Use: Never    Passive Exposure: Not on file  Financial Resource Strain: Not on file  Food Insecurity: Low Risk (01/12/2023)   Received from [facility]   Epic    Within the past 12 months, you worried that your food would run out before you got money to buy more: Never true    Within the past 12 months, the food you bought just didn't last and you didn't have money to get more. : Never true  Transportation Needs: No Transportation Needs (01/12/2023)   Received from [facility]    In the past 12 months, has lack of reliable transportation kept you from medical appointments, meetings, work or from getting things needed for daily living? : No  Physical Activity: Not on file  Stress: Not on file  Social Connections: Unknown (07/29/2021)   Received from [facility]   Social Network    Social Network: Not on file  Depression (PHQ2-9): Not on file  Alcohol Screen: Not on file  Housing: Low Risk (01/12/2023)   Received from [facility]   Epic    What is your living situation today?: I have a steady place to live    Think about the place you live. Do you have problems with any of the following? Choose all that apply:: None/None on this list  Utilities: Low Risk (01/12/2023)   Received from [facility]   Utilities    In the past 12 months has the electric, gas, oil, or water company threatened to shut off services in your home? : No  Health  Literacy: Not on file     Family History: The patient's family history includes Diabetes in her brother; Heart attack in her brother; Hypertension in her brother, father, and mother. There is no history of Cancer.  ROS:   Please see the history of present illness.    All other systems reviewed and are negative.  EKGs/Labs/Other Studies Reviewed:    The following studies were reviewed today: .SABRAEKG Interpretation Date/Time:  Thursday March 02 2024 15:26:42 EST Ventricular Rate:  78 PR Interval:  170 QRS Duration:  90 QT Interval:  384 QTC Calculation: 437 R Axis:   38  Text Interpretation: Normal sinus rhythm Septal infarct , age undetermined Abnormal ECG When compared with ECG of 15-Feb-2024 13:27, No significant change was found Confirmed by Edwyna Backers [phone]) on 03/02/2024 3:33:19 PM     Recent Labs: 12/27/2023: Hemoglobin 12.4; Platelets 210 01/11/2024: ALT 19; BUN 20; Creatinine,  Ser 0.78; Potassium 4.9; Sodium 144; TSH 1.290  Recent Lipid Panel No results found for: CHOL, TRIG, HDL, CHOLHDL, VLDL, LDLCALC, LDLDIRECT  Physical Exam:    VS:  BP (!) 156/80   Pulse 78   Ht 5' 10 (1.778 m)   Wt 160 lb (72.6 kg)   SpO2 97%   BMI 22.96 kg/m     Wt Readings from Last 3 Encounters:  03/02/24 160 lb (72.6 kg)  02/28/24 163 lb 6.4 oz (74.1 kg)  02/08/24 160 lb 12.8 oz (72.9 kg)     GEN: Patient is in no acute distress HEENT: Normal NECK: No JVD; No carotid bruits LYMPHATICS: No lymphadenopathy CARDIAC: Hear sounds regular, 2/6 systolic murmur at the apex. RESPIRATORY:  Clear to auscultation without rales, wheezing or rhonchi  ABDOMEN: Soft, non-tender, non-distended MUSCULOSKELETAL:  No edema; No deformity  SKIN: Warm and dry NEUROLOGIC:  Alert and oriented x 3 PSYCHIATRIC:  Normal affect   Signed, Jennifer JONELLE Crape, MD  03/02/2024 3:45 PM    [facility]     [1]  Current Meds  Medication Sig   ALPRAZolam   (XANAX ) 0.5 MG tablet Take 0.5 mg by mouth 2 (two) times daily as needed for anxiety.   apixaban  (ELIQUIS ) 5 MG TABS tablet Take 1 tablet (5 mg total) by mouth 2 (two) times daily.   Biotin 5000 MCG CAPS Take 5,000 mcg by mouth daily.   Calcium  Carb-Cholecalciferol (CALCIUM  600+D3 PO) Take 1 tablet by mouth in the morning.   cyclobenzaprine  (FLEXERIL ) 5 MG tablet Take 1 tablet (5 mg total) by mouth 3 (three) times daily as needed for muscle spasms.   docusate sodium  (COLACE) 100 MG capsule Take 1 capsule (100 mg total) by mouth 2 (two) times daily. (Patient taking differently: Take 100 mg by mouth daily as needed for mild constipation or moderate constipation.)   famotidine  (PEPCID ) 20 MG tablet Take 20 mg by mouth daily as needed for heartburn or indigestion.   flecainide  (TAMBOCOR ) 50 MG tablet Take 1 tablet (50 mg total) by mouth 2 (two) times daily.   metoprolol  succinate (TOPROL -XL) 50 MG 24 hr tablet Take 1 tablet (50 mg total) by mouth daily. Take with or immediately following a meal.   Omega-3 Fatty Acids (FISH OIL) 1000 MG CAPS Take 1,000 mg by mouth in the morning.   oxymetazoline  (AFRIN) 0.05 % nasal spray Place 1 spray into both nostrils at bedtime.   Turmeric 500 MG CAPS Take 500 mg by mouth daily.

## 2024-03-02 NOTE — Progress Notes (Signed)
 Cardiology Office Note:    Date:  03/02/2024   ID:  [name], DOB March 23, 1945, MRN [id number]  PCP:  Fernand Tracey DELENA, MD  Cardiologist:  Jennifer JONELLE Crape, MD   Referring MD: Fernand Tracey DELENA, MD    ASSESSMENT:    1. Essential hypertension   2. Dyspnea on exertion   3. Coronary artery disease involving native coronary artery of native heart, unspecified whether angina present    PLAN:    In order of problems listed above:  Coronary artery disease: Abnormal CT coronary angiography: Dyspnea on exertion: I discussed my findings with the patient extensively.  The following recommendations were made to the patient.  She was advised to take a coated baby aspirin on a daily basis.I discussed coronary angiography and left heart catheterization with the patient at extensive length. Procedure, benefits and potential risks were explained. Patient had multiple questions which were answered to the patient's satisfaction. Patient agreed and consented for the procedure. Further recommendations will be made based on the findings of the coronary angiography. In the interim. The patient has any significant symptoms he knows to go to the nearest emergency room. Essential hypertension: Blood pressure stable and diet was emphasized.  She appears anxious about these findings.  I given a prescription for alprazolam  and discussed precautions about using it.  She does not drive her husband drives for her and he has accompanied her for this visit.  She will use it only on a as needed basis. Statin therapy: Recent liver function was point.  I initiated the patient on 20 mg of rosuvastatin  daily. Paroxysmal atrial fibrillation:I discussed with the patient atrial fibrillation, disease process. Management and therapy including rate and rhythm control, anticoagulation benefits and potential risks were discussed extensively with the patient. Patient had multiple questions which were answered to patient's satisfaction.  In  view of the above I have discontinued flecainide  and started Multaq . She will be seen in follow-up appointment after the coronary angiography.   Medication Adjustments/Labs and Tests Ordered: Current medicines are reviewed at length with the patient today.  Concerns regarding medicines are outlined above.  Orders Placed This Encounter  Procedures   EKG 12-Lead   No orders of the defined types were placed in this encounter.    No chief complaint on file.    History of Present Illness:    ARIYON Diaz is a 78 y.o. female.  Patient has past medical history of paroxysmal atrial fibrillation, moderate mitral regurgitation, essential hypertension, mixed dyslipidemia.  She has paroxysmal atrial fibrillation and could not tolerate amiodarone  so she was on flecainide .  CT coronary angiography has revealed significant obstructive coronary artery disease especially in the left main and right coronary artery.  Patient is concerned about this and was called for evaluation.  At the time of my evaluation, the patient is alert awake oriented and in no distress.  Past Medical History:  Diagnosis Date   Anxiety    Arthritis    Essential hypertension 10/28/2023   GERD (gastroesophageal reflux disease)    Hypertension    Lumbar pseudoarthrosis 07/31/2021   Moderate mitral regurgitation 01/11/2024   New onset atrial fibrillation (HCC) 10/28/2023   PONV (postoperative nausea and vomiting)    Spondylolisthesis, lumbar region 11/25/2020    Past Surgical History:  Procedure Laterality Date   BACK SURGERY     11/25/2020   CHOLECYSTECTOMY     EYE SURGERY     FRACTURE SURGERY Left    left wrist  TUBAL LIGATION      Current Medications: Active Medications[1]   Allergies:   Sulfa antibiotics, Codeine, Morphine , Amiodarone , Meperidine, and Tramadol hcl   Social History   Socioeconomic History   Marital status: Married    Spouse name: Not on file   Number of children: Not on file   Years  of education: Not on file   Highest education level: Not on file  Occupational History   Not on file  Tobacco Use   Smoking status: Never   Smokeless tobacco: Never  Vaping Use   Vaping status: Never Used  Substance and Sexual Activity   Alcohol use: Never   Drug use: Never   Sexual activity: Not on file  Other Topics Concern   Not on file  Social History Narrative   Not on file   Social Drivers of Health   Tobacco Use: Low Risk (03/02/2024)   Patient History    Smoking Tobacco Use: Never    Smokeless Tobacco Use: Never    Passive Exposure: Not on file  Financial Resource Strain: Not on file  Food Insecurity: Low Risk (01/12/2023)   Received from [facility]   Epic    Within the past 12 months, you worried that your food would run out before you got money to buy more: Never true    Within the past 12 months, the food you bought just didn't last and you didn't have money to get more. : Never true  Transportation Needs: No Transportation Needs (01/12/2023)   Received from [facility]    In the past 12 months, has lack of reliable transportation kept you from medical appointments, meetings, work or from getting things needed for daily living? : No  Physical Activity: Not on file  Stress: Not on file  Social Connections: Unknown (07/29/2021)   Received from [facility]   Social Network    Social Network: Not on file  Depression (PHQ2-9): Not on file  Alcohol Screen: Not on file  Housing: Low Risk (01/12/2023)   Received from [facility]   Epic    What is your living situation today?: I have a steady place to live    Think about the place you live. Do you have problems with any of the following? Choose all that apply:: None/None on this list  Utilities: Low Risk (01/12/2023)   Received from [facility]   Utilities    In the past 12 months has the electric, gas, oil, or water company threatened to shut off services in your home? : No  Health  Literacy: Not on file     Family History: The patient's family history includes Diabetes in her brother; Heart attack in her brother; Hypertension in her brother, father, and mother. There is no history of Cancer.  ROS:   Please see the history of present illness.    All other systems reviewed and are negative.  EKGs/Labs/Other Studies Reviewed:    The following studies were reviewed today: .SABRAEKG Interpretation Date/Time:  Thursday March 02 2024 15:26:42 EST Ventricular Rate:  78 PR Interval:  170 QRS Duration:  90 QT Interval:  384 QTC Calculation: 437 R Axis:   38  Text Interpretation: Normal sinus rhythm Septal infarct , age undetermined Abnormal ECG When compared with ECG of 15-Feb-2024 13:27, No significant change was found Confirmed by Edwyna Backers [phone]) on 03/02/2024 3:33:19 PM     Recent Labs: 12/27/2023: Hemoglobin 12.4; Platelets 210 01/11/2024: ALT 19; BUN 20; Creatinine,  Ser 0.78; Potassium 4.9; Sodium 144; TSH 1.290  Recent Lipid Panel No results found for: CHOL, TRIG, HDL, CHOLHDL, VLDL, LDLCALC, LDLDIRECT  Physical Exam:    VS:  BP (!) 156/80   Pulse 78   Ht 5' 10 (1.778 m)   Wt 160 lb (72.6 kg)   SpO2 97%   BMI 22.96 kg/m     Wt Readings from Last 3 Encounters:  03/02/24 160 lb (72.6 kg)  02/28/24 163 lb 6.4 oz (74.1 kg)  02/08/24 160 lb 12.8 oz (72.9 kg)     GEN: Patient is in no acute distress HEENT: Normal NECK: No JVD; No carotid bruits LYMPHATICS: No lymphadenopathy CARDIAC: Hear sounds regular, 2/6 systolic murmur at the apex. RESPIRATORY:  Clear to auscultation without rales, wheezing or rhonchi  ABDOMEN: Soft, non-tender, non-distended MUSCULOSKELETAL:  No edema; No deformity  SKIN: Warm and dry NEUROLOGIC:  Alert and oriented x 3 PSYCHIATRIC:  Normal affect   Signed, Jennifer JONELLE Crape, MD  03/02/2024 3:45 PM    [facility]     [1]  Current Meds  Medication Sig   ALPRAZolam   (XANAX ) 0.5 MG tablet Take 0.5 mg by mouth 2 (two) times daily as needed for anxiety.   apixaban  (ELIQUIS ) 5 MG TABS tablet Take 1 tablet (5 mg total) by mouth 2 (two) times daily.   Biotin 5000 MCG CAPS Take 5,000 mcg by mouth daily.   Calcium  Carb-Cholecalciferol (CALCIUM  600+D3 PO) Take 1 tablet by mouth in the morning.   cyclobenzaprine  (FLEXERIL ) 5 MG tablet Take 1 tablet (5 mg total) by mouth 3 (three) times daily as needed for muscle spasms.   docusate sodium  (COLACE) 100 MG capsule Take 1 capsule (100 mg total) by mouth 2 (two) times daily. (Patient taking differently: Take 100 mg by mouth daily as needed for mild constipation or moderate constipation.)   famotidine  (PEPCID ) 20 MG tablet Take 20 mg by mouth daily as needed for heartburn or indigestion.   flecainide  (TAMBOCOR ) 50 MG tablet Take 1 tablet (50 mg total) by mouth 2 (two) times daily.   metoprolol  succinate (TOPROL -XL) 50 MG 24 hr tablet Take 1 tablet (50 mg total) by mouth daily. Take with or immediately following a meal.   Omega-3 Fatty Acids (FISH OIL) 1000 MG CAPS Take 1,000 mg by mouth in the morning.   oxymetazoline  (AFRIN) 0.05 % nasal spray Place 1 spray into both nostrils at bedtime.   Turmeric 500 MG CAPS Take 500 mg by mouth daily.

## 2024-03-03 LAB — CBC WITH DIFFERENTIAL/PLATELET
Basophils Absolute: 0.1 x10E3/uL (ref 0.0–0.2)
Basos: 1 %
EOS (ABSOLUTE): 0.6 x10E3/uL — ABNORMAL HIGH (ref 0.0–0.4)
Eos: 5 %
Hematocrit: 40 % (ref 34.0–46.6)
Hemoglobin: 13.4 g/dL (ref 11.1–15.9)
Immature Grans (Abs): 0 x10E3/uL (ref 0.0–0.1)
Immature Granulocytes: 0 %
Lymphocytes Absolute: 2 x10E3/uL (ref 0.7–3.1)
Lymphs: 16 %
MCH: 29.2 pg (ref 26.6–33.0)
MCHC: 33.5 g/dL (ref 31.5–35.7)
MCV: 87 fL (ref 79–97)
Monocytes Absolute: 0.9 x10E3/uL (ref 0.1–0.9)
Monocytes: 8 %
Neutrophils Absolute: 8.5 x10E3/uL — ABNORMAL HIGH (ref 1.4–7.0)
Neutrophils: 70 %
Platelets: 320 x10E3/uL (ref 150–450)
RBC: 4.59 x10E6/uL (ref 3.77–5.28)
RDW: 13 % (ref 11.7–15.4)
WBC: 12 x10E3/uL — ABNORMAL HIGH (ref 3.4–10.8)

## 2024-03-03 LAB — BASIC METABOLIC PANEL WITH GFR
BUN/Creatinine Ratio: 27 (ref 12–28)
BUN: 20 mg/dL (ref 8–27)
CO2: 23 mmol/L (ref 20–29)
Calcium: 9.9 mg/dL (ref 8.7–10.3)
Chloride: 99 mmol/L (ref 96–106)
Creatinine, Ser: 0.74 mg/dL (ref 0.57–1.00)
Glucose: 97 mg/dL (ref 70–99)
Potassium: 4.9 mmol/L (ref 3.5–5.2)
Sodium: 141 mmol/L (ref 134–144)
eGFR: 84 mL/min/1.73

## 2024-03-07 ENCOUNTER — Ambulatory Visit: Payer: Self-pay | Admitting: Cardiology

## 2024-03-14 ENCOUNTER — Ambulatory Visit (HOSPITAL_COMMUNITY)
Admission: RE | Admit: 2024-03-14 | Discharge: 2024-03-14 | Disposition: A | Discharge status: Home / Self Care | Attending: Cardiology | Admitting: Cardiology

## 2024-03-14 ENCOUNTER — Ambulatory Visit (HOSPITAL_COMMUNITY)
Admission: RE | Disposition: A | Discharge status: Home / Self Care | Payer: Self-pay | Source: Home / Self Care | Attending: Cardiology

## 2024-03-14 ENCOUNTER — Other Ambulatory Visit (HOSPITAL_COMMUNITY): Payer: Self-pay

## 2024-03-14 ENCOUNTER — Other Ambulatory Visit: Payer: Self-pay

## 2024-03-14 DIAGNOSIS — I480 Paroxysmal atrial fibrillation: Secondary | ICD-10-CM | POA: Insufficient documentation

## 2024-03-14 DIAGNOSIS — Z79899 Other long term (current) drug therapy: Secondary | ICD-10-CM | POA: Diagnosis not present

## 2024-03-14 DIAGNOSIS — Z7901 Long term (current) use of anticoagulants: Secondary | ICD-10-CM | POA: Insufficient documentation

## 2024-03-14 DIAGNOSIS — I4891 Unspecified atrial fibrillation: Secondary | ICD-10-CM

## 2024-03-14 DIAGNOSIS — I2510 Atherosclerotic heart disease of native coronary artery without angina pectoris: Secondary | ICD-10-CM | POA: Diagnosis not present

## 2024-03-14 DIAGNOSIS — I10 Essential (primary) hypertension: Secondary | ICD-10-CM | POA: Insufficient documentation

## 2024-03-14 DIAGNOSIS — Z7982 Long term (current) use of aspirin: Secondary | ICD-10-CM | POA: Diagnosis not present

## 2024-03-14 DIAGNOSIS — R9439 Abnormal result of other cardiovascular function study: Secondary | ICD-10-CM | POA: Diagnosis present

## 2024-03-14 HISTORY — PX: LEFT HEART CATH AND CORONARY ANGIOGRAPHY: CATH118249

## 2024-03-14 MED ORDER — HEPARIN SODIUM (PORCINE) 1000 UNIT/ML IJ SOLN
INTRAMUSCULAR | Status: AC
  Filled 2024-03-14: qty 10

## 2024-03-14 MED ORDER — ACETAMINOPHEN 325 MG PO TABS: 650.0000 mg | ORAL_TABLET | ORAL | Status: DC | PRN

## 2024-03-14 MED ORDER — AMLODIPINE BESYLATE 2.5 MG PO TABS
2.5000 mg | ORAL_TABLET | Freq: Every day | ORAL | 2 refills | Status: DC
  Filled 2024-03-14 – 2024-04-03 (×2): qty 30, 30d supply, fill #0

## 2024-03-14 MED ORDER — FREE WATER: 500.0000 mL | Freq: Once | Status: DC

## 2024-03-14 MED ORDER — HEPARIN (PORCINE) IN NACL 1000-0.9 UT/500ML-% IV SOLN
INTRAVENOUS | Status: DC | PRN
  Administered 2024-03-14 (×2): 500 mL

## 2024-03-14 MED ORDER — ASPIRIN 81 MG PO CHEW: 81.0000 mg | CHEWABLE_TABLET | Freq: Once | ORAL | Status: DC

## 2024-03-14 MED ORDER — IOHEXOL 350 MG/ML SOLN
INTRAVENOUS | Status: DC | PRN
  Administered 2024-03-14: 33 mL

## 2024-03-14 MED ORDER — FENTANYL CITRATE (PF) 100 MCG/2ML IJ SOLN
INTRAMUSCULAR | Status: DC | PRN
  Administered 2024-03-14: 25 ug via INTRAVENOUS

## 2024-03-14 MED ORDER — HEPARIN SODIUM (PORCINE) 1000 UNIT/ML IJ SOLN
INTRAMUSCULAR | Status: DC | PRN
  Administered 2024-03-14: 3500 [IU] via INTRAVENOUS

## 2024-03-14 MED ORDER — SODIUM CHLORIDE 0.9 % IV SOLN: 250.0000 mL | INTRAVENOUS | Status: DC | PRN

## 2024-03-14 MED ORDER — ONDANSETRON HCL 4 MG/2ML IJ SOLN: 4.0000 mg | Freq: Four times a day (QID) | INTRAMUSCULAR | Status: DC | PRN

## 2024-03-14 MED ORDER — SODIUM CHLORIDE 0.9% FLUSH: 3.0000 mL | INTRAVENOUS | Status: DC | PRN

## 2024-03-14 MED ORDER — MIDAZOLAM HCL 2 MG/2ML IJ SOLN
INTRAMUSCULAR | Status: AC
  Filled 2024-03-14: qty 2

## 2024-03-14 MED ORDER — ISOSORBIDE MONONITRATE ER 30 MG PO TB24
30.0000 mg | ORAL_TABLET | Freq: Every day | ORAL | 2 refills | Status: DC
  Filled 2024-03-14 – 2024-04-03 (×2): qty 30, 30d supply, fill #0

## 2024-03-14 MED ORDER — VERAPAMIL HCL 2.5 MG/ML IV SOLN
INTRAVENOUS | Status: AC
  Filled 2024-03-14: qty 2

## 2024-03-14 MED ORDER — APIXABAN 5 MG PO TABS: 5.0000 mg | ORAL_TABLET | Freq: Two times a day (BID) | ORAL | Status: AC

## 2024-03-14 MED ORDER — NITROGLYCERIN 0.4 MG SL SUBL
0.4000 mg | SUBLINGUAL_TABLET | SUBLINGUAL | 0 refills | Status: DC | PRN
  Filled 2024-03-14: qty 25, 5d supply, fill #0

## 2024-03-14 MED ORDER — FENTANYL CITRATE (PF) 100 MCG/2ML IJ SOLN
INTRAMUSCULAR | Status: AC
  Filled 2024-03-14: qty 2

## 2024-03-14 MED ORDER — VERAPAMIL HCL 2.5 MG/ML IV SOLN
INTRAVENOUS | Status: DC | PRN
  Administered 2024-03-14: 10 mL via INTRA_ARTERIAL

## 2024-03-14 MED ORDER — SODIUM CHLORIDE 0.9% FLUSH: 3.0000 mL | Freq: Two times a day (BID) | INTRAVENOUS | Status: DC

## 2024-03-14 MED ORDER — MIDAZOLAM HCL (PF) 2 MG/2ML IJ SOLN
INTRAMUSCULAR | Status: DC | PRN
  Administered 2024-03-14: 2 mg via INTRAVENOUS

## 2024-03-14 MED ORDER — LIDOCAINE HCL (PF) 1 % IJ SOLN
INTRAMUSCULAR | Status: AC
  Filled 2024-03-14: qty 30

## 2024-03-14 NOTE — Interval H&P Note (Signed)
 History and Physical Interval Note:  03/14/2024 10:30 AM  Ann Diaz  has presented today for surgery, with the diagnosis of abnormal ct.  The various methods of treatment have been discussed with the patient and family. After consideration of risks, benefits and other options for treatment, the patient has consented to  Procedures: LEFT HEART CATH AND CORONARY ANGIOGRAPHY (N/A) and possible coronary angioplasty for high risk coronary CTA and dyspnea on exertion as a surgical intervention.  The patient's history has been reviewed, patient examined, no change in status, stable for surgery.  I have reviewed the patient's chart and labs.  Questions were answered to the patient's satisfaction.     Gordy Bergamo

## 2024-03-14 NOTE — Discharge Instructions (Signed)

## 2024-03-14 NOTE — Progress Notes (Signed)
 TR band removed, gauze dressing applied. Right radial level 0, clean, dry, and intact.

## 2024-03-15 ENCOUNTER — Encounter (HOSPITAL_COMMUNITY): Payer: Self-pay | Admitting: Cardiology

## 2024-03-15 MED FILL — Lidocaine HCl Local Preservative Free (PF) Inj 1%: INTRAMUSCULAR | Qty: 30 | Status: AC

## 2024-03-29 ENCOUNTER — Ambulatory Visit: Payer: Self-pay | Admitting: Cardiology

## 2024-03-30 ENCOUNTER — Other Ambulatory Visit: Payer: Self-pay | Admitting: *Deleted

## 2024-03-30 ENCOUNTER — Ambulatory Visit

## 2024-03-30 VITALS — BP 155/69 | HR 72 | Resp 20 | Ht 70.0 in | Wt 160.0 lb

## 2024-03-30 DIAGNOSIS — I2510 Atherosclerotic heart disease of native coronary artery without angina pectoris: Secondary | ICD-10-CM

## 2024-03-30 NOTE — Patient Instructions (Signed)
 Bring a bra with you to the hospital on the day of surgery.  Look for something that is soft and comfortable that provides some support but is not constricting.  Below is an example of a bra that is available on [employer].

## 2024-03-30 NOTE — Progress Notes (Signed)
 "   [address] [place] [address]             [phone]    Ann Diaz [facility] Medical Record #[id number] Date of Birth: June 10, 1945  Referring: Ladona Heinz, MD Primary Care: Fernand Tracey DELENA, MD Primary Cardiologist:None  Chief Complaint:    Chief Complaint  Patient presents with   Coronary Artery Disease    Surgical consult/ Cardiac Cath 03/14/24/ ECHO 11/19/23    History of Present Illness:     Ann Diaz is a 79 year old woman with history of paroxysmal atrial fibrillation hypertension GERD and mild mitral regurgitation.  She presented with atrial fibrillation initially which prompted a CT coronary scan.  This demonstrated significant calcium  and subsequent left heart cath showed multivessel coronary artery disease.  She has been having shortness of breath and syncope.  Her shortness of breath has improved with management of her A-fib.  A Holter study back in October demonstrated a 2% burden of atrial fibrillation.  She was most recently put on Multaq  by Dr. Edwyna.  She was previously on amiodarone  and then flecainide .  She also reports left shoulder pain that radiates from the back to the front that improves with laying flat.  She saw an orthopedist who said it was a pinched nerve and she has been trying physical therapy but it has not yet resolved.  Of note, she walks with a significant limp.  She does not know why she has a limp but she is able to ambulate without any assistive devices.  She is fully functional and independent in her ADLs.  She goes to the gym twice a week for exercise class.  She is a never smoker.  LHC (03/14/2024): Left main 50%, LAD 75%, D1 70%, Distal Cx 80%, Prox RCA 80% TTE (11/20/23): Normal BiV function, mild-mod mitral regurgitation Chest CT (03/01/2024): No significant aortic calcium   Past Medical and Surgical History: Previous Chest Surgery: No Previous Chest Radiation: No Diabetes Mellitus: No.  HbA1C  N/A Anticoagulation: No, Last dose N/A  Creatinine:  Lab Results  Component Value Date   CREATININE 0.74 03/02/2024   CREATININE 0.78 01/11/2024   CREATININE 0.86 12/27/2023     Past Medical History:  Diagnosis Date   Anxiety    Arthritis    Essential hypertension 10/28/2023   GERD (gastroesophageal reflux disease)    Hypertension    Lumbar pseudoarthrosis 07/31/2021   Moderate mitral regurgitation 01/11/2024   New onset atrial fibrillation (HCC) 10/28/2023   PONV (postoperative nausea and vomiting)    Spondylolisthesis, lumbar region 11/25/2020    Past Surgical History:  Procedure Laterality Date   BACK SURGERY     11/25/2020   CHOLECYSTECTOMY     EYE SURGERY     FRACTURE SURGERY Left    left wrist   LEFT HEART CATH AND CORONARY ANGIOGRAPHY N/A 03/14/2024   Procedure: LEFT HEART CATH AND CORONARY ANGIOGRAPHY;  Surgeon: Ladona Heinz, MD;  Location: [facility];  Service: Cardiovascular;  Laterality: N/A;   TUBAL LIGATION      Social History:  Tobacco Use History[1]  Social History   Substance and Sexual Activity  Alcohol Use Never     Allergies[2]  Current Outpatient Medications  Medication Sig Dispense Refill   acetaminophen  (TYLENOL ) 650 MG CR tablet Take 1,300 mg by mouth at bedtime. Might take additional doses during the day if needed     ALPRAZolam  (XANAX ) 0.5 MG tablet Take 0.5  mg by mouth 2 (two) times daily as needed for anxiety.     amLODipine  (NORVASC ) 2.5 MG tablet Take 1 tablet (2.5 mg total) by mouth daily. 30 tablet 2   apixaban  (ELIQUIS ) 5 MG TABS tablet Take 1 tablet (5 mg total) by mouth 2 (two) times daily.     aspirin  EC 81 MG tablet Take 81 mg by mouth daily. Swallow whole.     Biotin 5000 MCG CAPS Take 5,000 mcg by mouth daily.     Calcium  Carb-Cholecalciferol (CALCIUM  600+D3 PO) Take 1 tablet by mouth in the morning.     cyclobenzaprine  (FLEXERIL ) 5 MG tablet Take 1 tablet (5 mg total) by mouth 3 (three) times daily as needed for  muscle spasms. 50 tablet 1   diclofenac Sodium (VOLTAREN) 1 % GEL Apply 1 Application topically daily as needed (shoulder pain).     dronedarone  (MULTAQ ) 400 MG tablet Take 1 tablet (400 mg total) by mouth 2 (two) times daily with a meal. 60 tablet 12   famotidine  (PEPCID ) 20 MG tablet Take 20 mg by mouth daily as needed for heartburn or indigestion.     fexofenadine (ALLEGRA) 180 MG tablet Take 180 mg by mouth daily as needed for allergies or rhinitis.     Green Tea, Camellia sinensis, (GREEN TEA PO) Take 2 tablets by mouth daily. Fat burner     isosorbide  mononitrate (IMDUR ) 30 MG 24 hr tablet Take 1 tablet (30 mg total) by mouth daily. 30 tablet 2   metoprolol  succinate (TOPROL -XL) 50 MG 24 hr tablet Take 1 tablet (50 mg total) by mouth daily. Take with or immediately following a meal. 90 tablet 3   nitroGLYCERIN  (NITROSTAT ) 0.4 MG SL tablet Place 1 tablet (0.4 mg total) under the tongue every 5 (five) minutes as needed for chest pain. 25 tablet 0   oxymetazoline  (AFRIN) 0.05 % nasal spray Place 1-2 sprays into both nostrils daily as needed for congestion.     rosuvastatin  (CRESTOR ) 20 MG tablet Take 1 tablet (20 mg total) by mouth daily. 90 tablet 3   No current facility-administered medications for this visit.    (Not in a hospital admission)   Family History  Problem Relation Age of Onset   Hypertension Mother    Hypertension Father    Heart attack Brother    Hypertension Brother    Diabetes Brother    Cancer Neg Hx      Review of Systems:   Review of Systems  Constitutional:  Positive for malaise/fatigue. Negative for weight loss.  Respiratory:  Positive for shortness of breath.   Cardiovascular:  Negative for chest pain, palpitations, orthopnea and leg swelling.  Musculoskeletal:  Positive for joint pain (shoulder).  Neurological:  Positive for dizziness. Negative for headaches.      Physical Exam: BP (!) 155/69   Pulse 72   Resp 20   Ht 5' 10 (1.778 m)   Wt 160  lb (72.6 kg)   SpO2 97% Comment: RA  BMI 22.96 kg/m  Physical Exam Constitutional:      Appearance: Normal appearance.  HENT:     Head: Normocephalic and atraumatic.  Cardiovascular:     Rate and Rhythm: Normal rate and regular rhythm.     Heart sounds: No murmur heard. Pulmonary:     Effort: Pulmonary effort is normal.     Breath sounds: Normal breath sounds.  Abdominal:     General: There is no distension.     Palpations: Abdomen is soft.  Tenderness: There is no abdominal tenderness.  Musculoskeletal:        General: No swelling.     Comments: Has a small varicosity bilaterally on each calf just distal to the knee.  Neurological:     Mental Status: She is alert.     Comments: Walks with a significant limp but does not require any assistive devices.      One varicosity on each calf just distal to the knee.  Diagnostic Studies & Laboratory data: Cardiac Studies & Procedures   ______________________________________________________________________________________________ CARDIAC CATHETERIZATION  CARDIAC CATHETERIZATION 03/14/2024  Conclusion Images from the original result were not included. Cardiac Catheterization 03/14/2024: Hemodynamic data: LVEDP 19 mmHg, moderately elevated.  No pressure gradient across aortic valve.  Angiographic data: LM: Long vessel, distal left main has a 50% stenosis. LAD: Large vessel, ostial mild to moderate calcification evident with a 75% stenosis.  Large D1 with a proximal 70% stenosis and there is a large D2.  LAD after D2 in the midsegment has long segment 80% stenosis. RI: Small vessel. LCx: Gives origin to large OM1, large OM 3.  After small OM 2, there is a focal 80% stenosis. RCA: Very large caliber vessel.  Moderate disease is evident in the proximal to mid segment of 30 to 40% followed by a 80% stenosis in the midsegment.  Distal RCA has a diffuse 60% stenosis followed by a very large PL branch with multiple secondary branches  which has proximal/ostial 80% stenosis.    Impression and recommendations: Patient has hemodynamically significant proximal LAD stenosis by CT FFR at 0.8.  Also the lesion appears significant and mildly calcified.  Patient also with class III angina pectoris, given multivessel disease, she would benefit from evaluation for CABG.  Findings Coronary Findings Diagnostic  Dominance: Right  Left Main Mid LM to Dist LM lesion is 50% stenosed. Dist LM to Prox LAD lesion is 75% stenosed.  Left Anterior Descending Mid LAD lesion is 80% stenosed.  First Diagonal Branch 1st Diag lesion is 70% stenosed.  Ramus Intermedius Ramus lesion is 30% stenosed.  Left Circumflex Mid Cx lesion is 80% stenosed.  Right Coronary Artery Ost RCA to Prox RCA lesion is 30% stenosed. Prox RCA-1 lesion is 30% stenosed. Prox RCA-2 lesion is 40% stenosed. Mid RCA lesion is 80% stenosed. Dist RCA lesion is 60% stenosed.  Right Posterior Atrioventricular Artery Vessel is large in size. RPAV lesion is 80% stenosed.  First Right Posterolateral Branch Vessel is large in size.  Second Right Posterolateral Branch Vessel is large in size.  Intervention  No interventions have been documented.     ECHOCARDIOGRAM  ECHOCARDIOGRAM COMPLETE 11/19/2023  Narrative ECHOCARDIOGRAM REPORT    Patient Name:   Ann Diaz Date of Exam: 11/19/2023 Medical Rec #:  [id number]      Height:       70.0 in Accession #:    [id number]     Weight:       163.6 lb Date of Birth:  [birth year]     BSA:          1.916 m Patient Age:    77 years       BP:           136/68 mmHg Patient Gender: F              HR:           60 bpm. Exam Location:  [place]  Procedure: 2D Echo, Cardiac Doppler, Color Doppler and Strain Analysis (  Both Spectral and Color Flow Doppler were utilized during procedure).  Indications:    New onset atrial fibrillation (HCC) [I48.91 (ICD-10-CM)]  History:        Patient has no prior history of  Echocardiogram examinations. Arrythmias:Atrial Fibrillation; Risk Factors:Hypertension.  Sonographer:    Saddie Chimes Referring Phys: CYRUS JENNIFER SAUNDERS [facility]  IMPRESSIONS   1. Left ventricular ejection fraction, by estimation, is 60 to 65%. Left ventricular ejection fraction by 3D volume is 61 %. The left ventricle has normal function. The left ventricle has no regional wall motion abnormalities. Left ventricular diastolic parameters are consistent with Grade I diastolic dysfunction (impaired relaxation). The average left ventricular global longitudinal strain is 23.3 %. The global longitudinal strain is normal. 2. Right ventricular systolic function is normal. The right ventricular size is normal. There is normal pulmonary artery systolic pressure. 3. The mitral valve is normal in structure. Mild to moderate mitral valve regurgitation. No evidence of mitral stenosis. 4. The aortic valve is normal in structure. Aortic valve regurgitation is mild. No aortic stenosis is present. 5. The inferior vena cava is normal in size with greater than 50% respiratory variability, suggesting right atrial pressure of 3 mmHg.  FINDINGS Left Ventricle: Left ventricular ejection fraction, by estimation, is 60 to 65%. Left ventricular ejection fraction by 3D volume is 61 %. The left ventricle has normal function. The left ventricle has no regional wall motion abnormalities. The average left ventricular global longitudinal strain is 23.3 %. Strain was performed and the global longitudinal strain is normal. The left ventricular internal cavity size was normal in size. There is borderline left ventricular hypertrophy. Left ventricular diastolic parameters are consistent with Grade I diastolic dysfunction (impaired relaxation).  Right Ventricle: The right ventricular size is normal. No increase in right ventricular wall thickness. Right ventricular systolic function is normal. There is normal pulmonary artery systolic  pressure. The tricuspid regurgitant velocity is 2.16 m/s, and with an assumed right atrial pressure of 3 mmHg, the estimated right ventricular systolic pressure is 21.7 mmHg.  Left Atrium: Left atrial size was normal in size.  Right Atrium: Right atrial size was normal in size.  Pericardium: There is no evidence of pericardial effusion.  Mitral Valve: The mitral valve is normal in structure. Mild to moderate mitral valve regurgitation. No evidence of mitral valve stenosis. MV peak gradient, 3.5 mmHg. The mean mitral valve gradient is 2.0 mmHg.  Tricuspid Valve: The tricuspid valve is normal in structure. Tricuspid valve regurgitation is trivial. No evidence of tricuspid stenosis.  Aortic Valve: The aortic valve is normal in structure. Aortic valve regurgitation is mild. Aortic regurgitation PHT measures 661 msec. No aortic stenosis is present. Aortic valve mean gradient measures 2.0 mmHg. Aortic valve peak gradient measures 4.0 mmHg. Aortic valve area, by VTI measures 2.82 cm.  Pulmonic Valve: The pulmonic valve was normal in structure. Pulmonic valve regurgitation is not visualized. No evidence of pulmonic stenosis.  Aorta: The aortic root is normal in size and structure.  Venous: The inferior vena cava is normal in size with greater than 50% respiratory variability, suggesting right atrial pressure of 3 mmHg.  IAS/Shunts: No atrial level shunt detected by color flow Doppler.  Additional Comments: 3D was performed not requiring image post processing on an independent workstation and was normal.   LEFT VENTRICLE PLAX 2D LVIDd:         4.70 cm         Diastology LVIDs:  2.90 cm         LV e' medial:    9.79 cm/s LV PW:         1.10 cm         LV E/e' medial:  7.5 LV IVS:        1.30 cm         LV e' lateral:   8.16 cm/s LVOT diam:     1.90 cm         LV E/e' lateral: 9.1 LV SV:         67 LV SV Index:   35              2D Longitudinal LVOT Area:     2.84 cm         Strain 2D Strain GLS   23.3 % Avg:  3D Volume EF LV 3D EF:    Left ventricul ar ejection fraction by 3D volume is 61 %.  3D Volume EF: 3D EF:        61 %  RIGHT VENTRICLE RV Basal diam:  3.70 cm RV Mid diam:    2.90 cm TAPSE (M-mode): 1.8 cm  LEFT ATRIUM           Index LA diam:      2.50 cm 1.30 cm/m LA Vol (A4C): 60.8 ml 31.72 ml/m AORTIC VALVE                    PULMONIC VALVE AV Area (Vmax):    2.65 cm     PV Vmax:       0.75 m/s AV Area (Vmean):   2.57 cm     PV Vmean:      51.100 cm/s AV Area (VTI):     2.82 cm     PV VTI:        0.167 m AV Vmax:           99.60 cm/s   PV Peak grad:  2.2 mmHg AV Vmean:          66.700 cm/s  PV Mean grad:  1.0 mmHg AV VTI:            0.238 m AV Peak Grad:      4.0 mmHg AV Mean Grad:      2.0 mmHg LVOT Vmax:         93.25 cm/s LVOT Vmean:        60.500 cm/s LVOT VTI:          0.236 m LVOT/AV VTI ratio: 0.99 AI PHT:            661 msec  AORTA Ao Asc diam: 3.20 cm  MITRAL VALVE               TRICUSPID VALVE MV Area (PHT): 2.21 cm    TR Peak grad:   18.7 mmHg MV Area VTI:   1.36 cm    TR Vmax:        216.00 cm/s MV Peak grad:  3.5 mmHg MV Mean grad:  2.0 mmHg    SHUNTS MV Vmax:       0.94 m/s    Systemic VTI:  0.24 m MV Vmean:      57.3 cm/s   Systemic Diam: 1.90 cm MV Decel Time: 343 msec MR Peak grad: 46.8 mmHg MR Vmax:      342.00 cm/s MV E velocity: 73.90 cm/s MV A velocity: 99.00 cm/s MV E/A ratio:  0.75  Lamar Fitch MD Electronically signed by Lamar Fitch MD Signature Date/Time: 11/20/2023/10:15:01 AM    Final    MONITORS  LONG TERM MONITOR (3-14 DAYS) 01/04/2024  Narrative Patch Wear Time:  13 days and 14 hours (2025-10-01T17:04:40-0400 to 2025-10-15T07:57:35-0400)  Patient had a min HR of 48 bpm, max HR of 197 bpm, and avg HR of 74 bpm.  Predominant underlying rhythm was Sinus Rhythm.  227 Supraventricular Tachycardia runs occurred, the run with the fastest interval lasting 8 beats  with a max rate of 197 bpm, the longest lasting 11.1 secs with an avg rate of 137 bpm. Some episodes of Supraventricular Tachycardia may be possible Atrial Tachycardia with variable block. Atrial Fibrillation occurred (2% burden), ranging from 74-179 bpm (avg of 118 bpm), the longest lasting 7 hours 33 mins with an avg rate of 118 bpm. Atrial Fibrillation was detected within +/- 45 seconds of symptomatic patient event(s). Isolated SVEs were rare (<1.0%), SVE Couplets were rare (<1.0%), and SVE Triplets were rare (<1.0%).  Isolated VEs were rare (<1.0%), VE Couplets were rare (<1.0%), and no VE Triplets were present. Ventricular Trigeminy was present.   CT SCANS  CT CORONARY MORPH W/CTA COR W/SCORE 03/01/2024  Addendum 03/28/2024  9:20 PM ADDENDUM REPORT: 03/28/2024 21:18  EXAM: OVER-READ INTERPRETATION  CT CHEST  The following report is an over-read performed by radiologist Dr. Oneil Devonshire of [facility] on 03/28/2024. This over-read does not include interpretation of cardiac or coronary anatomy or pathology. The coronary calcium  score/coronary CTA interpretation by the cardiologist is attached.  COMPARISON:  None.  FINDINGS: Cardiovascular: Atherosclerotic calcifications of the aorta are noted. No aneurysmal dilatation is seen. No evidence of dissection is noted. No pulmonary emboli are identified.  Mediastinum/Nodes: There are no enlarged lymph nodes within the visualized mediastinum.  Lungs/Pleura: There is no pleural effusion. The visualized lungs appear clear.  Upper abdomen: Small sliding-type hiatal hernia is seen.  Musculoskeletal/Chest wall: No chest wall mass or suspicious osseous findings within the visualized chest.  IMPRESSION: Aortic Atherosclerosis (ICD10-I70.0).  Hiatal hernia.   Electronically Signed By: Oneil Devonshire M.D. On: 03/28/2024 21:18  Narrative HISTORY: Dyspnea on exertion (DOE)  EXAM: Cardiac/Coronary  CT  PROTOCOL: A  non-contrast, gated CT scan was obtained with axial slices of 2.5 mm through the heart for calcium  scoring. Calcium  scoring was performed using the Agatston method. A 120 kV prospective, gated, contrast cardiac CT scan was obtained. Gantry rotation speed was 230 msec and collimation was 0.63 mm. Two sublingual nitroglycerin  tablets (0.8 mg) were given. The 3D data set was reconstructed with motion correction for the best systolic or diastolic phase. Images were analyzed on a dedicated workstation using MPR, MIP, and VRT modes. The patient received 95 cc of contrast.  FINDINGS: Image quality: Average.  Artifact: Limited.  Coronary calcium  score is 1571, which places the patient in the 97th percentile for age and sex matched control.  Coronary arteries: Normal coronary origins.  Right dominance.  Left Main Coronary Artery: Normal caliber vessel, originates from the left coronary cusp, trifurcates into a left anterior descending artery (LAD), left circumflex artery (LCX), and ramus intermedius (RI). Minimal plaque (< 24%) at the ostial left main due to mixed disease. Moderate stenosis (50-69%) and due to mixed plaque at the distal left main as it trifurcates into the LAD, ramus, and LCX.  Left Anterior Descending Artery: Normal caliber vessel, wraps the apex, gives off 1 diagonal branch. Mild stenosis (25-49%) due to mixed plaque at the  ostial LAD. Mild stenosis (25-49%) at the proximal LAD due to predominantly calcified plaque. Moderate stenosis (50-69%) mid LAD due to mixed plaque. Distal to apical LAD is patent.  First Diagonal branch: Patent  Ramus intermedius: Moderate stenosis (50-69%) closer to 50% at the ostial ramus due to predominantly calcified plaque, accuracy limited due to blooming artifact and small caliber size.  Left Circumflex Artery: Proximal to mid segment patent, moderate stenosis (50-69%) at the mid to distal LCX due to mixed plaque.  First Obtuse  Marginal branch: Patent  Second Obtuse Marginal branch: Patent, small  Third Obtuse Marginal branch: Patent  Right Coronary Artery: Very dominant vessel with diffuse disease. Moderate stenosis (50-69%) predominantly calcified plaque in the proximal RCA, moderate stenosis 50-69%) due to mixed plaque mid RCA, severe stenosis (70-99%) distal RCA due to mixed plaque at the bifurcation of PDA and PLB. Moderate stenosis (50-69%) due to mixed plaque at the ostial PDA. Moderate stenosis (50-69%) due to calcified plaque at the proximal PLB.  Aorta: Normal size, 34 mm at the mid ascending aorta (level of the PA bifurcation) measured double oblique. Aortic atherosclerosis.  Aortic Valve: Native valve, trileaflet aortic valve, no significant calcification.  Mitral valve: Native valve, no significant mitral annular calcification.  Other findings:  Normal pulmonary vein drainage into the left atrium.  Normal left atrial appendage without thrombus.  Normal size of the pulmonary artery.  Please see separate report from [facility] for non-cardiac findings.  IMPRESSION: 1. Coronary calcium  score of 1571. This was 97th percentile for age and sex matched control.  2. Normal coronary origins with right dominance.  3. CAD-RADS 4 Severe coronary artery disease.  4. CT FFR will be performed and reported separately to further evaluate distal left main, LAD, Ramus, RCA.  5. Aortic atherosclerosis.  RECOMMENDATION: CT FFR is recommended.  Consider symptom-guided anti-ischemic pharmacotherapy as well as risk factor modification per guideline directed care.  Electronically Signed: By: Madonna Large On: 03/01/2024 23:11     ______________________________________________________________________________________________     EKG: NSR I have independently reviewed the above radiologic studies and discussed with the patient   Recent Lab Findings: Lab Results  Component Value  Date   WBC 12.0 (H) 03/02/2024   HGB 13.4 03/02/2024   HCT 40.0 03/02/2024   PLT 320 03/02/2024   GLUCOSE 97 03/02/2024   ALT 19 01/11/2024   AST 24 01/11/2024   NA 141 03/02/2024   K 4.9 03/02/2024   CL 99 03/02/2024   CREATININE 0.74 03/02/2024   BUN 20 03/02/2024   CO2 23 03/02/2024   TSH 1.290 01/11/2024      Assessment / Plan:   Ann Diaz is a 79 year old woman with history of paroxysmal atrial fibrillation hypertension GERD and mitral regurgitation.  She has been having shortness of breath which is improved with management of her atrial fibrillation.  She denies chest pain but does have left shoulder pain which is thought to be from a pinched nerve.  She is fully functional and independent in her ADLs.  Given her history of paroxysmal A-fib we will plan to perform pulmonary vein isolation with the encompass clamp at the time of her CABG.  We will also clip her left atrial appendage.  Her TTE in September demonstrates mild to moderate mitral regurgitation.  I suspect with diuresis and management of her atrial fibrillation, the mitral regurgitation should be improved.  No plans to intervene on her mitral regurgitation at the time of surgery.  I discussed the general  nature of the procedure, including the need for general anesthesia, the incisions to be used, the use of cardiopulmonary bypass, and the use of temporary pacemaker wires and drainage tubes postoperatively with her and her husband.  We discussed the expected hospital stay, overall recovery and short and long term outcomes. I informed her of the indications, risks, benefits and alternatives.   She understands the risks include, but are not limited to death, stroke, MI, DVT/PE, bleeding, possible need for transfusion, infections, cardiac arrhythmias, as well as other organ system dysfunction including respiratory (eg: prolonged ventilation), renal, or GI complications.   Plan for CABG, PVI and LAAL on 1/21. She will need to  hold her eliquis  3 days prior to surgery.  I  spent 45 minutes counseling the patient face to face.   Con RAMAN Juston Goheen 03/30/2024 2:53 PM          [1]  Social History Tobacco Use  Smoking Status Never  Smokeless Tobacco Never  [2]  Allergies Allergen Reactions   Sulfa Antibiotics Nausea And Vomiting and Other (See Comments)    hypokalemia SEVERE hypokalemia    Codeine Nausea And Vomiting   Morphine  Nausea And Vomiting   Amiodarone  Nausea And Vomiting   Meperidine Hives   Tramadol Hcl Itching   "

## 2024-03-30 NOTE — H&P (View-Only) (Signed)
 "   [address] [place] [address]             [phone]    Ann Diaz [facility] Medical Record #[id number] Date of Birth: June 10, 1945  Referring: Ladona Heinz, MD Primary Care: Fernand Tracey DELENA, MD Primary Cardiologist:None  Chief Complaint:    Chief Complaint  Patient presents with   Coronary Artery Disease    Surgical consult/ Cardiac Cath 03/14/24/ ECHO 11/19/23    History of Present Illness:     Ann Diaz is a 79 year old woman with history of paroxysmal atrial fibrillation hypertension GERD and mild mitral regurgitation.  She presented with atrial fibrillation initially which prompted a CT coronary scan.  This demonstrated significant calcium  and subsequent left heart cath showed multivessel coronary artery disease.  She has been having shortness of breath and syncope.  Her shortness of breath has improved with management of her A-fib.  A Holter study back in October demonstrated a 2% burden of atrial fibrillation.  She was most recently put on Multaq  by Dr. Edwyna.  She was previously on amiodarone  and then flecainide .  She also reports left shoulder pain that radiates from the back to the front that improves with laying flat.  She saw an orthopedist who said it was a pinched nerve and she has been trying physical therapy but it has not yet resolved.  Of note, she walks with a significant limp.  She does not know why she has a limp but she is able to ambulate without any assistive devices.  She is fully functional and independent in her ADLs.  She goes to the gym twice a week for exercise class.  She is a never smoker.  LHC (03/14/2024): Left main 50%, LAD 75%, D1 70%, Distal Cx 80%, Prox RCA 80% TTE (11/20/23): Normal BiV function, mild-mod mitral regurgitation Chest CT (03/01/2024): No significant aortic calcium   Past Medical and Surgical History: Previous Chest Surgery: No Previous Chest Radiation: No Diabetes Mellitus: No.  HbA1C  N/A Anticoagulation: No, Last dose N/A  Creatinine:  Lab Results  Component Value Date   CREATININE 0.74 03/02/2024   CREATININE 0.78 01/11/2024   CREATININE 0.86 12/27/2023     Past Medical History:  Diagnosis Date   Anxiety    Arthritis    Essential hypertension 10/28/2023   GERD (gastroesophageal reflux disease)    Hypertension    Lumbar pseudoarthrosis 07/31/2021   Moderate mitral regurgitation 01/11/2024   New onset atrial fibrillation (HCC) 10/28/2023   PONV (postoperative nausea and vomiting)    Spondylolisthesis, lumbar region 11/25/2020    Past Surgical History:  Procedure Laterality Date   BACK SURGERY     11/25/2020   CHOLECYSTECTOMY     EYE SURGERY     FRACTURE SURGERY Left    left wrist   LEFT HEART CATH AND CORONARY ANGIOGRAPHY N/A 03/14/2024   Procedure: LEFT HEART CATH AND CORONARY ANGIOGRAPHY;  Surgeon: Ladona Heinz, MD;  Location: [facility];  Service: Cardiovascular;  Laterality: N/A;   TUBAL LIGATION      Social History:  Tobacco Use History[1]  Social History   Substance and Sexual Activity  Alcohol Use Never     Allergies[2]  Current Outpatient Medications  Medication Sig Dispense Refill   acetaminophen  (TYLENOL ) 650 MG CR tablet Take 1,300 mg by mouth at bedtime. Might take additional doses during the day if needed     ALPRAZolam  (XANAX ) 0.5 MG tablet Take 0.5  mg by mouth 2 (two) times daily as needed for anxiety.     amLODipine  (NORVASC ) 2.5 MG tablet Take 1 tablet (2.5 mg total) by mouth daily. 30 tablet 2   apixaban  (ELIQUIS ) 5 MG TABS tablet Take 1 tablet (5 mg total) by mouth 2 (two) times daily.     aspirin  EC 81 MG tablet Take 81 mg by mouth daily. Swallow whole.     Biotin 5000 MCG CAPS Take 5,000 mcg by mouth daily.     Calcium  Carb-Cholecalciferol (CALCIUM  600+D3 PO) Take 1 tablet by mouth in the morning.     cyclobenzaprine  (FLEXERIL ) 5 MG tablet Take 1 tablet (5 mg total) by mouth 3 (three) times daily as needed for  muscle spasms. 50 tablet 1   diclofenac Sodium (VOLTAREN) 1 % GEL Apply 1 Application topically daily as needed (shoulder pain).     dronedarone  (MULTAQ ) 400 MG tablet Take 1 tablet (400 mg total) by mouth 2 (two) times daily with a meal. 60 tablet 12   famotidine  (PEPCID ) 20 MG tablet Take 20 mg by mouth daily as needed for heartburn or indigestion.     fexofenadine (ALLEGRA) 180 MG tablet Take 180 mg by mouth daily as needed for allergies or rhinitis.     Green Tea, Camellia sinensis, (GREEN TEA PO) Take 2 tablets by mouth daily. Fat burner     isosorbide  mononitrate (IMDUR ) 30 MG 24 hr tablet Take 1 tablet (30 mg total) by mouth daily. 30 tablet 2   metoprolol  succinate (TOPROL -XL) 50 MG 24 hr tablet Take 1 tablet (50 mg total) by mouth daily. Take with or immediately following a meal. 90 tablet 3   nitroGLYCERIN  (NITROSTAT ) 0.4 MG SL tablet Place 1 tablet (0.4 mg total) under the tongue every 5 (five) minutes as needed for chest pain. 25 tablet 0   oxymetazoline  (AFRIN) 0.05 % nasal spray Place 1-2 sprays into both nostrils daily as needed for congestion.     rosuvastatin  (CRESTOR ) 20 MG tablet Take 1 tablet (20 mg total) by mouth daily. 90 tablet 3   No current facility-administered medications for this visit.    (Not in a hospital admission)   Family History  Problem Relation Age of Onset   Hypertension Mother    Hypertension Father    Heart attack Brother    Hypertension Brother    Diabetes Brother    Cancer Neg Hx      Review of Systems:   Review of Systems  Constitutional:  Positive for malaise/fatigue. Negative for weight loss.  Respiratory:  Positive for shortness of breath.   Cardiovascular:  Negative for chest pain, palpitations, orthopnea and leg swelling.  Musculoskeletal:  Positive for joint pain (shoulder).  Neurological:  Positive for dizziness. Negative for headaches.      Physical Exam: BP (!) 155/69   Pulse 72   Resp 20   Ht 5' 10 (1.778 m)   Wt 160  lb (72.6 kg)   SpO2 97% Comment: RA  BMI 22.96 kg/m  Physical Exam Constitutional:      Appearance: Normal appearance.  HENT:     Head: Normocephalic and atraumatic.  Cardiovascular:     Rate and Rhythm: Normal rate and regular rhythm.     Heart sounds: No murmur heard. Pulmonary:     Effort: Pulmonary effort is normal.     Breath sounds: Normal breath sounds.  Abdominal:     General: There is no distension.     Palpations: Abdomen is soft.  Tenderness: There is no abdominal tenderness.  Musculoskeletal:        General: No swelling.     Comments: Has a small varicosity bilaterally on each calf just distal to the knee.  Neurological:     Mental Status: She is alert.     Comments: Walks with a significant limp but does not require any assistive devices.      One varicosity on each calf just distal to the knee.  Diagnostic Studies & Laboratory data: Cardiac Studies & Procedures   ______________________________________________________________________________________________ CARDIAC CATHETERIZATION  CARDIAC CATHETERIZATION 03/14/2024  Conclusion Images from the original result were not included. Cardiac Catheterization 03/14/2024: Hemodynamic data: LVEDP 19 mmHg, moderately elevated.  No pressure gradient across aortic valve.  Angiographic data: LM: Long vessel, distal left main has a 50% stenosis. LAD: Large vessel, ostial mild to moderate calcification evident with a 75% stenosis.  Large D1 with a proximal 70% stenosis and there is a large D2.  LAD after D2 in the midsegment has long segment 80% stenosis. RI: Small vessel. LCx: Gives origin to large OM1, large OM 3.  After small OM 2, there is a focal 80% stenosis. RCA: Very large caliber vessel.  Moderate disease is evident in the proximal to mid segment of 30 to 40% followed by a 80% stenosis in the midsegment.  Distal RCA has a diffuse 60% stenosis followed by a very large PL branch with multiple secondary branches  which has proximal/ostial 80% stenosis.    Impression and recommendations: Patient has hemodynamically significant proximal LAD stenosis by CT FFR at 0.8.  Also the lesion appears significant and mildly calcified.  Patient also with class III angina pectoris, given multivessel disease, she would benefit from evaluation for CABG.  Findings Coronary Findings Diagnostic  Dominance: Right  Left Main Mid LM to Dist LM lesion is 50% stenosed. Dist LM to Prox LAD lesion is 75% stenosed.  Left Anterior Descending Mid LAD lesion is 80% stenosed.  First Diagonal Branch 1st Diag lesion is 70% stenosed.  Ramus Intermedius Ramus lesion is 30% stenosed.  Left Circumflex Mid Cx lesion is 80% stenosed.  Right Coronary Artery Ost RCA to Prox RCA lesion is 30% stenosed. Prox RCA-1 lesion is 30% stenosed. Prox RCA-2 lesion is 40% stenosed. Mid RCA lesion is 80% stenosed. Dist RCA lesion is 60% stenosed.  Right Posterior Atrioventricular Artery Vessel is large in size. RPAV lesion is 80% stenosed.  First Right Posterolateral Branch Vessel is large in size.  Second Right Posterolateral Branch Vessel is large in size.  Intervention  No interventions have been documented.     ECHOCARDIOGRAM  ECHOCARDIOGRAM COMPLETE 11/19/2023  Narrative ECHOCARDIOGRAM REPORT    Patient Name:   Ann Diaz Date of Exam: 11/19/2023 Medical Rec #:  [id number]      Height:       70.0 in Accession #:    [id number]     Weight:       163.6 lb Date of Birth:  [birth year]     BSA:          1.916 m Patient Age:    77 years       BP:           136/68 mmHg Patient Gender: F              HR:           60 bpm. Exam Location:  [place]  Procedure: 2D Echo, Cardiac Doppler, Color Doppler and Strain Analysis (  Both Spectral and Color Flow Doppler were utilized during procedure).  Indications:    New onset atrial fibrillation (HCC) [I48.91 (ICD-10-CM)]  History:        Patient has no prior history of  Echocardiogram examinations. Arrythmias:Atrial Fibrillation; Risk Factors:Hypertension.  Sonographer:    Saddie Chimes Referring Phys: CYRUS JENNIFER SAUNDERS [facility]  IMPRESSIONS   1. Left ventricular ejection fraction, by estimation, is 60 to 65%. Left ventricular ejection fraction by 3D volume is 61 %. The left ventricle has normal function. The left ventricle has no regional wall motion abnormalities. Left ventricular diastolic parameters are consistent with Grade I diastolic dysfunction (impaired relaxation). The average left ventricular global longitudinal strain is 23.3 %. The global longitudinal strain is normal. 2. Right ventricular systolic function is normal. The right ventricular size is normal. There is normal pulmonary artery systolic pressure. 3. The mitral valve is normal in structure. Mild to moderate mitral valve regurgitation. No evidence of mitral stenosis. 4. The aortic valve is normal in structure. Aortic valve regurgitation is mild. No aortic stenosis is present. 5. The inferior vena cava is normal in size with greater than 50% respiratory variability, suggesting right atrial pressure of 3 mmHg.  FINDINGS Left Ventricle: Left ventricular ejection fraction, by estimation, is 60 to 65%. Left ventricular ejection fraction by 3D volume is 61 %. The left ventricle has normal function. The left ventricle has no regional wall motion abnormalities. The average left ventricular global longitudinal strain is 23.3 %. Strain was performed and the global longitudinal strain is normal. The left ventricular internal cavity size was normal in size. There is borderline left ventricular hypertrophy. Left ventricular diastolic parameters are consistent with Grade I diastolic dysfunction (impaired relaxation).  Right Ventricle: The right ventricular size is normal. No increase in right ventricular wall thickness. Right ventricular systolic function is normal. There is normal pulmonary artery systolic  pressure. The tricuspid regurgitant velocity is 2.16 m/s, and with an assumed right atrial pressure of 3 mmHg, the estimated right ventricular systolic pressure is 21.7 mmHg.  Left Atrium: Left atrial size was normal in size.  Right Atrium: Right atrial size was normal in size.  Pericardium: There is no evidence of pericardial effusion.  Mitral Valve: The mitral valve is normal in structure. Mild to moderate mitral valve regurgitation. No evidence of mitral valve stenosis. MV peak gradient, 3.5 mmHg. The mean mitral valve gradient is 2.0 mmHg.  Tricuspid Valve: The tricuspid valve is normal in structure. Tricuspid valve regurgitation is trivial. No evidence of tricuspid stenosis.  Aortic Valve: The aortic valve is normal in structure. Aortic valve regurgitation is mild. Aortic regurgitation PHT measures 661 msec. No aortic stenosis is present. Aortic valve mean gradient measures 2.0 mmHg. Aortic valve peak gradient measures 4.0 mmHg. Aortic valve area, by VTI measures 2.82 cm.  Pulmonic Valve: The pulmonic valve was normal in structure. Pulmonic valve regurgitation is not visualized. No evidence of pulmonic stenosis.  Aorta: The aortic root is normal in size and structure.  Venous: The inferior vena cava is normal in size with greater than 50% respiratory variability, suggesting right atrial pressure of 3 mmHg.  IAS/Shunts: No atrial level shunt detected by color flow Doppler.  Additional Comments: 3D was performed not requiring image post processing on an independent workstation and was normal.   LEFT VENTRICLE PLAX 2D LVIDd:         4.70 cm         Diastology LVIDs:  2.90 cm         LV e' medial:    9.79 cm/s LV PW:         1.10 cm         LV E/e' medial:  7.5 LV IVS:        1.30 cm         LV e' lateral:   8.16 cm/s LVOT diam:     1.90 cm         LV E/e' lateral: 9.1 LV SV:         67 LV SV Index:   35              2D Longitudinal LVOT Area:     2.84 cm         Strain 2D Strain GLS   23.3 % Avg:  3D Volume EF LV 3D EF:    Left ventricul ar ejection fraction by 3D volume is 61 %.  3D Volume EF: 3D EF:        61 %  RIGHT VENTRICLE RV Basal diam:  3.70 cm RV Mid diam:    2.90 cm TAPSE (M-mode): 1.8 cm  LEFT ATRIUM           Index LA diam:      2.50 cm 1.30 cm/m LA Vol (A4C): 60.8 ml 31.72 ml/m AORTIC VALVE                    PULMONIC VALVE AV Area (Vmax):    2.65 cm     PV Vmax:       0.75 m/s AV Area (Vmean):   2.57 cm     PV Vmean:      51.100 cm/s AV Area (VTI):     2.82 cm     PV VTI:        0.167 m AV Vmax:           99.60 cm/s   PV Peak grad:  2.2 mmHg AV Vmean:          66.700 cm/s  PV Mean grad:  1.0 mmHg AV VTI:            0.238 m AV Peak Grad:      4.0 mmHg AV Mean Grad:      2.0 mmHg LVOT Vmax:         93.25 cm/s LVOT Vmean:        60.500 cm/s LVOT VTI:          0.236 m LVOT/AV VTI ratio: 0.99 AI PHT:            661 msec  AORTA Ao Asc diam: 3.20 cm  MITRAL VALVE               TRICUSPID VALVE MV Area (PHT): 2.21 cm    TR Peak grad:   18.7 mmHg MV Area VTI:   1.36 cm    TR Vmax:        216.00 cm/s MV Peak grad:  3.5 mmHg MV Mean grad:  2.0 mmHg    SHUNTS MV Vmax:       0.94 m/s    Systemic VTI:  0.24 m MV Vmean:      57.3 cm/s   Systemic Diam: 1.90 cm MV Decel Time: 343 msec MR Peak grad: 46.8 mmHg MR Vmax:      342.00 cm/s MV E velocity: 73.90 cm/s MV A velocity: 99.00 cm/s MV E/A ratio:  0.75  Lamar Fitch MD Electronically signed by Lamar Fitch MD Signature Date/Time: 11/20/2023/10:15:01 AM    Final    MONITORS  LONG TERM MONITOR (3-14 DAYS) 01/04/2024  Narrative Patch Wear Time:  13 days and 14 hours (2025-10-01T17:04:40-0400 to 2025-10-15T07:57:35-0400)  Patient had a min HR of 48 bpm, max HR of 197 bpm, and avg HR of 74 bpm.  Predominant underlying rhythm was Sinus Rhythm.  227 Supraventricular Tachycardia runs occurred, the run with the fastest interval lasting 8 beats  with a max rate of 197 bpm, the longest lasting 11.1 secs with an avg rate of 137 bpm. Some episodes of Supraventricular Tachycardia may be possible Atrial Tachycardia with variable block. Atrial Fibrillation occurred (2% burden), ranging from 74-179 bpm (avg of 118 bpm), the longest lasting 7 hours 33 mins with an avg rate of 118 bpm. Atrial Fibrillation was detected within +/- 45 seconds of symptomatic patient event(s). Isolated SVEs were rare (<1.0%), SVE Couplets were rare (<1.0%), and SVE Triplets were rare (<1.0%).  Isolated VEs were rare (<1.0%), VE Couplets were rare (<1.0%), and no VE Triplets were present. Ventricular Trigeminy was present.   CT SCANS  CT CORONARY MORPH W/CTA COR W/SCORE 03/01/2024  Addendum 03/28/2024  9:20 PM ADDENDUM REPORT: 03/28/2024 21:18  EXAM: OVER-READ INTERPRETATION  CT CHEST  The following report is an over-read performed by radiologist Dr. Oneil Devonshire of [facility] on 03/28/2024. This over-read does not include interpretation of cardiac or coronary anatomy or pathology. The coronary calcium  score/coronary CTA interpretation by the cardiologist is attached.  COMPARISON:  None.  FINDINGS: Cardiovascular: Atherosclerotic calcifications of the aorta are noted. No aneurysmal dilatation is seen. No evidence of dissection is noted. No pulmonary emboli are identified.  Mediastinum/Nodes: There are no enlarged lymph nodes within the visualized mediastinum.  Lungs/Pleura: There is no pleural effusion. The visualized lungs appear clear.  Upper abdomen: Small sliding-type hiatal hernia is seen.  Musculoskeletal/Chest wall: No chest wall mass or suspicious osseous findings within the visualized chest.  IMPRESSION: Aortic Atherosclerosis (ICD10-I70.0).  Hiatal hernia.   Electronically Signed By: Oneil Devonshire M.D. On: 03/28/2024 21:18  Narrative HISTORY: Dyspnea on exertion (DOE)  EXAM: Cardiac/Coronary  CT  PROTOCOL: A  non-contrast, gated CT scan was obtained with axial slices of 2.5 mm through the heart for calcium  scoring. Calcium  scoring was performed using the Agatston method. A 120 kV prospective, gated, contrast cardiac CT scan was obtained. Gantry rotation speed was 230 msec and collimation was 0.63 mm. Two sublingual nitroglycerin  tablets (0.8 mg) were given. The 3D data set was reconstructed with motion correction for the best systolic or diastolic phase. Images were analyzed on a dedicated workstation using MPR, MIP, and VRT modes. The patient received 95 cc of contrast.  FINDINGS: Image quality: Average.  Artifact: Limited.  Coronary calcium  score is 1571, which places the patient in the 97th percentile for age and sex matched control.  Coronary arteries: Normal coronary origins.  Right dominance.  Left Main Coronary Artery: Normal caliber vessel, originates from the left coronary cusp, trifurcates into a left anterior descending artery (LAD), left circumflex artery (LCX), and ramus intermedius (RI). Minimal plaque (< 24%) at the ostial left main due to mixed disease. Moderate stenosis (50-69%) and due to mixed plaque at the distal left main as it trifurcates into the LAD, ramus, and LCX.  Left Anterior Descending Artery: Normal caliber vessel, wraps the apex, gives off 1 diagonal branch. Mild stenosis (25-49%) due to mixed plaque at the  ostial LAD. Mild stenosis (25-49%) at the proximal LAD due to predominantly calcified plaque. Moderate stenosis (50-69%) mid LAD due to mixed plaque. Distal to apical LAD is patent.  First Diagonal branch: Patent  Ramus intermedius: Moderate stenosis (50-69%) closer to 50% at the ostial ramus due to predominantly calcified plaque, accuracy limited due to blooming artifact and small caliber size.  Left Circumflex Artery: Proximal to mid segment patent, moderate stenosis (50-69%) at the mid to distal LCX due to mixed plaque.  First Obtuse  Marginal branch: Patent  Second Obtuse Marginal branch: Patent, small  Third Obtuse Marginal branch: Patent  Right Coronary Artery: Very dominant vessel with diffuse disease. Moderate stenosis (50-69%) predominantly calcified plaque in the proximal RCA, moderate stenosis 50-69%) due to mixed plaque mid RCA, severe stenosis (70-99%) distal RCA due to mixed plaque at the bifurcation of PDA and PLB. Moderate stenosis (50-69%) due to mixed plaque at the ostial PDA. Moderate stenosis (50-69%) due to calcified plaque at the proximal PLB.  Aorta: Normal size, 34 mm at the mid ascending aorta (level of the PA bifurcation) measured double oblique. Aortic atherosclerosis.  Aortic Valve: Native valve, trileaflet aortic valve, no significant calcification.  Mitral valve: Native valve, no significant mitral annular calcification.  Other findings:  Normal pulmonary vein drainage into the left atrium.  Normal left atrial appendage without thrombus.  Normal size of the pulmonary artery.  Please see separate report from [facility] for non-cardiac findings.  IMPRESSION: 1. Coronary calcium  score of 1571. This was 97th percentile for age and sex matched control.  2. Normal coronary origins with right dominance.  3. CAD-RADS 4 Severe coronary artery disease.  4. CT FFR will be performed and reported separately to further evaluate distal left main, LAD, Ramus, RCA.  5. Aortic atherosclerosis.  RECOMMENDATION: CT FFR is recommended.  Consider symptom-guided anti-ischemic pharmacotherapy as well as risk factor modification per guideline directed care.  Electronically Signed: By: Madonna Large On: 03/01/2024 23:11     ______________________________________________________________________________________________     EKG: NSR I have independently reviewed the above radiologic studies and discussed with the patient   Recent Lab Findings: Lab Results  Component Value  Date   WBC 12.0 (H) 03/02/2024   HGB 13.4 03/02/2024   HCT 40.0 03/02/2024   PLT 320 03/02/2024   GLUCOSE 97 03/02/2024   ALT 19 01/11/2024   AST 24 01/11/2024   NA 141 03/02/2024   K 4.9 03/02/2024   CL 99 03/02/2024   CREATININE 0.74 03/02/2024   BUN 20 03/02/2024   CO2 23 03/02/2024   TSH 1.290 01/11/2024      Assessment / Plan:   Ms. Daughety is a 79 year old woman with history of paroxysmal atrial fibrillation hypertension GERD and mitral regurgitation.  She has been having shortness of breath which is improved with management of her atrial fibrillation.  She denies chest pain but does have left shoulder pain which is thought to be from a pinched nerve.  She is fully functional and independent in her ADLs.  Given her history of paroxysmal A-fib we will plan to perform pulmonary vein isolation with the encompass clamp at the time of her CABG.  We will also clip her left atrial appendage.  Her TTE in September demonstrates mild to moderate mitral regurgitation.  I suspect with diuresis and management of her atrial fibrillation, the mitral regurgitation should be improved.  No plans to intervene on her mitral regurgitation at the time of surgery.  I discussed the general  nature of the procedure, including the need for general anesthesia, the incisions to be used, the use of cardiopulmonary bypass, and the use of temporary pacemaker wires and drainage tubes postoperatively with her and her husband.  We discussed the expected hospital stay, overall recovery and short and long term outcomes. I informed her of the indications, risks, benefits and alternatives.   She understands the risks include, but are not limited to death, stroke, MI, DVT/PE, bleeding, possible need for transfusion, infections, cardiac arrhythmias, as well as other organ system dysfunction including respiratory (eg: prolonged ventilation), renal, or GI complications.   Plan for CABG, PVI and LAAL on 1/21. She will need to  hold her eliquis  3 days prior to surgery.  I  spent 45 minutes counseling the patient face to face.   Con RAMAN Juston Goheen 03/30/2024 2:53 PM          [1]  Social History Tobacco Use  Smoking Status Never  Smokeless Tobacco Never  [2]  Allergies Allergen Reactions   Sulfa Antibiotics Nausea And Vomiting and Other (See Comments)    hypokalemia SEVERE hypokalemia    Codeine Nausea And Vomiting   Morphine  Nausea And Vomiting   Amiodarone  Nausea And Vomiting   Meperidine Hives   Tramadol Hcl Itching   "

## 2024-03-31 ENCOUNTER — Encounter: Payer: Self-pay | Admitting: *Deleted

## 2024-04-03 ENCOUNTER — Other Ambulatory Visit (HOSPITAL_BASED_OUTPATIENT_CLINIC_OR_DEPARTMENT_OTHER): Payer: Self-pay

## 2024-04-03 NOTE — Pre-Procedure Instructions (Signed)
 Surgical Instructions   Your procedure is scheduled on April 05, 2024. Report to [facility] Main Entrance A at 6:30 A.M., then check in with the Admitting office. Any questions or running late day of surgery: call [phone]  Questions prior to your surgery date: call [phone], Monday-Friday, 8am-4pm. If you experience any cold or flu symptoms such as cough, fever, chills, shortness of breath, etc. between now and your scheduled surgery, please notify us  at the above number.     Remember:  Do not eat or drink after midnight the night before your surgery    Take these medicines the morning of surgery with A SIP OF WATER : amLODipine  (NORVASC )  dronedarone  (MULTAQ )  isosorbide  mononitrate (IMDUR )  metoprolol  succinate (TOPROL -XL)  rosuvastatin  (CRESTOR )    May take these medicines IF NEEDED: acetaminophen  (TYLENOL )  ALPRAZolam  (XANAX )  cyclobenzaprine  (FLEXERIL )  famotidine  (PEPCID )  fexofenadine (ALLEGRA)  nitroGLYCERIN  (NITROSTAT ) - if dose taken prior to surgery, please call [phone] oxymetazoline  (AFRIN) nasal spray    STOP taking your apixaban  (ELIQUIS ) three days prior to surgery. Last dose will be January 17th.  Continue taking your Aspirin  through the day before surgery. DO NOT take any the morning of surgery.   One week prior to surgery, STOP taking any Aleve, Naproxen, Ibuprofen, Motrin, Advil, Goody's, BC's, all herbal medications, fish oil, and non-prescription vitamins. This includes your medication: diclofenac Sodium (VOLTAREN) GEL                      Do NOT Smoke (Tobacco/Vaping) for 24 hours prior to your procedure.  If you use a CPAP at night, you may bring your mask/headgear for your overnight stay.   You will be asked to remove any contacts, glasses, piercing's, hearing aid's, dentures/partials prior to surgery. Please bring cases for these items if needed.    Your surgeon will determine if you are to be admitted or discharged the same  day.  Patients discharged the day of surgery will not be allowed to drive home, and someone needs to stay with them for 24 hours.  SURGICAL WAITING ROOM VISITATION Patients may have no more than 2 support people in the waiting area - these visitors may rotate.   Pre-op nurse will coordinate an appropriate time for 2 ADULT support persons, who may not rotate, to accompany patient in pre-op.  Children under the age of 34 must have an adult with them who is not the patient and must remain in the main waiting area with an adult.  If the patient needs to stay at the hospital during part of their recovery, the visitor guidelines for inpatient rooms apply.  Please refer to the [facility] website for the visitor guidelines for any additional information.   If you received a COVID test during your pre-op visit  it is requested that you wear a mask when out in public, stay away from anyone that may not be feeling well and notify your surgeon if you develop symptoms. If you have been in contact with anyone that has tested positive in the last 10 days please notify you surgeon.      Pre-operative CHG Bathing Instructions   You can play a key role in reducing the risk of infection after surgery. Your skin needs to be as free of germs as possible. You can reduce the number of germs on your skin by washing with CHG (chlorhexidine  gluconate) soap before surgery. CHG is an antiseptic soap that kills germs and continues to  kill germs even after washing.   DO NOT use if you have an allergy to chlorhexidine /CHG or antibacterial soaps. If your skin becomes reddened or irritated, stop using the CHG and notify one of our RNs at [phone].              TAKE A SHOWER THE NIGHT BEFORE SURGERY   Please keep in mind the following:  DO NOT shave, including legs and underarms, 48 hours prior to surgery.   You may shave your face before/day of surgery.  Place clean sheets on your bed the night before surgery Use a  clean washcloth (not used since being washed) for shower. DO NOT sleep with pet's night before surgery.  CHG Shower Instructions:  Wash your face and private area with normal soap. If you choose to wash your hair, wash first with your normal shampoo.  After you use shampoo/soap, rinse your hair and body thoroughly to remove shampoo/soap residue.  Turn the water  OFF and apply half the bottle of CHG soap to a CLEAN washcloth.  Apply CHG soap ONLY FROM YOUR NECK DOWN TO YOUR TOES (washing for 3-5 minutes)  DO NOT use CHG soap on face, private areas, open wounds, or sores.  Pay special attention to the area where your surgery is being performed.  If you are having back surgery, having someone wash your back for you may be helpful. Wait 2 minutes after CHG soap is applied, then you may rinse off the CHG soap.  Pat dry with a clean towel  Put on clean pajamas    Additional instructions for the day of surgery: If you choose, you may shower the morning of surgery with an antibacterial soap.  DO NOT APPLY any lotions, deodorants, cologne, or perfumes.   Do not wear jewelry or makeup Do not wear nail polish, gel polish, artificial nails, or any other type of covering on natural nails (fingers and toes) Do not bring valuables to the hospital. [facility] is not responsible for valuables/personal belongings. Put on clean/comfortable clothes.  Please brush your teeth.  Ask your nurse before applying any prescription medications to the skin.

## 2024-04-04 ENCOUNTER — Other Ambulatory Visit: Payer: Self-pay

## 2024-04-04 ENCOUNTER — Ambulatory Visit (HOSPITAL_COMMUNITY): Admission: RE | Admit: 2024-04-04 | Discharge: 2024-04-04 | Disposition: A | Source: Ambulatory Visit

## 2024-04-04 ENCOUNTER — Encounter (HOSPITAL_COMMUNITY): Payer: Self-pay

## 2024-04-04 ENCOUNTER — Ambulatory Visit (HOSPITAL_BASED_OUTPATIENT_CLINIC_OR_DEPARTMENT_OTHER)
Admission: RE | Admit: 2024-04-04 | Discharge: 2024-04-04 | Disposition: A | Discharge status: Home / Self Care | Source: Ambulatory Visit

## 2024-04-04 VITALS — BP 156/63 | HR 66 | Temp 97.7°F | Resp 18 | Ht 70.0 in | Wt 162.7 lb

## 2024-04-04 DIAGNOSIS — I2510 Atherosclerotic heart disease of native coronary artery without angina pectoris: Secondary | ICD-10-CM | POA: Insufficient documentation

## 2024-04-04 DIAGNOSIS — Z01818 Encounter for other preprocedural examination: Secondary | ICD-10-CM | POA: Insufficient documentation

## 2024-04-04 HISTORY — DX: Cardiac arrhythmia, unspecified: I49.9

## 2024-04-04 HISTORY — DX: Atherosclerotic heart disease of native coronary artery without angina pectoris: I25.10

## 2024-04-04 LAB — URINALYSIS, ROUTINE W REFLEX MICROSCOPIC
Bacteria, UA: NONE SEEN
Bilirubin Urine: NEGATIVE
Glucose, UA: NEGATIVE mg/dL
Hgb urine dipstick: NEGATIVE
Ketones, ur: NEGATIVE mg/dL
Nitrite: NEGATIVE
Protein, ur: NEGATIVE mg/dL
Specific Gravity, Urine: 1.009 (ref 1.005–1.030)
pH: 5 (ref 5.0–8.0)

## 2024-04-04 LAB — COMPREHENSIVE METABOLIC PANEL WITH GFR
ALT: 17 U/L (ref 0–44)
AST: 24 U/L (ref 15–41)
Albumin: 4.2 g/dL (ref 3.5–5.0)
Alkaline Phosphatase: 63 U/L (ref 38–126)
Anion gap: 10 (ref 5–15)
BUN: 19 mg/dL (ref 8–23)
CO2: 24 mmol/L (ref 22–32)
Calcium: 9.5 mg/dL (ref 8.9–10.3)
Chloride: 103 mmol/L (ref 98–111)
Creatinine, Ser: 0.88 mg/dL (ref 0.44–1.00)
GFR, Estimated: >60 mL/min
Glucose, Bld: 90 mg/dL (ref 70–99)
Potassium: 5 mmol/L (ref 3.5–5.1)
Sodium: 137 mmol/L (ref 135–145)
Total Bilirubin: 0.5 mg/dL (ref 0.0–1.2)
Total Protein: 6.9 g/dL (ref 6.5–8.1)

## 2024-04-04 LAB — CBC
HCT: 36.4 % (ref 36.0–46.0)
Hemoglobin: 12.1 g/dL (ref 12.0–15.0)
MCH: 29 pg (ref 26.0–34.0)
MCHC: 33.2 g/dL (ref 30.0–36.0)
MCV: 87.3 fL (ref 80.0–100.0)
Platelets: 232 K/uL (ref 150–400)
RBC: 4.17 MIL/uL (ref 3.87–5.11)
RDW: 13.6 % (ref 11.5–15.5)
WBC: 6.5 K/uL (ref 4.0–10.5)
nRBC: 0 % (ref 0.0–0.2)

## 2024-04-04 LAB — SURGICAL PCR SCREEN
MRSA, PCR: NEGATIVE
Staphylococcus aureus: NEGATIVE

## 2024-04-04 LAB — PROTIME-INR
INR: 1 (ref 0.8–1.2)
Prothrombin Time: 13.5 s (ref 11.4–15.2)

## 2024-04-04 LAB — HEMOGLOBIN A1C
Hgb A1c MFr Bld: 5.3 % (ref 4.8–5.6)
Mean Plasma Glucose: 105.41 mg/dL

## 2024-04-04 LAB — VAS US DOPPLER PRE CABG
Left ABI: 1.22
Right ABI: 1.15

## 2024-04-04 LAB — APTT: aPTT: 27 s (ref 24–36)

## 2024-04-04 MED ORDER — TRANEXAMIC ACID (OHS) BOLUS VIA INFUSION
15.0000 mg/kg | INTRAVENOUS | Status: AC
  Administered 2024-04-05: 1107 mg via INTRAVENOUS

## 2024-04-04 MED ORDER — VANCOMYCIN HCL 1250 MG/250ML IV SOLN
1250.0000 mg | INTRAVENOUS | Status: AC
  Administered 2024-04-05: 1250 mg via INTRAVENOUS
  Filled 2024-04-04: qty 250

## 2024-04-04 MED ORDER — NITROGLYCERIN IN D5W 200-5 MCG/ML-% IV SOLN
2.0000 ug/min | INTRAVENOUS | Status: DC
  Filled 2024-04-04: qty 250

## 2024-04-04 MED ORDER — TRANEXAMIC ACID 1000 MG/10ML IV SOLN
1.5000 mg/kg/h | INTRAVENOUS | Status: AC
  Administered 2024-04-05: 1.5 mg/kg/h via INTRAVENOUS
  Filled 2024-04-04: qty 25

## 2024-04-04 MED ORDER — INSULIN REGULAR(HUMAN) IN NACL 100-0.9 UT/100ML-% IV SOLN
INTRAVENOUS | Status: AC
  Administered 2024-04-05: 1.2 [IU]/h via INTRAVENOUS
  Filled 2024-04-04: qty 100

## 2024-04-04 MED ORDER — EPINEPHRINE HCL 5 MG/250ML IV SOLN IN NS
0.0000 ug/min | INTRAVENOUS | Status: DC
  Filled 2024-04-04: qty 250

## 2024-04-04 MED ORDER — NOREPINEPHRINE 4 MG/250ML-% IV SOLN
0.0000 ug/min | INTRAVENOUS | Status: AC
  Administered 2024-04-05: 2 ug/min via INTRAVENOUS
  Filled 2024-04-04: qty 250

## 2024-04-04 MED ORDER — POTASSIUM CHLORIDE 2 MEQ/ML IV SOLN
80.0000 meq | INTRAVENOUS | Status: DC
  Filled 2024-04-04: qty 40

## 2024-04-04 MED ORDER — TRANEXAMIC ACID (OHS) PUMP PRIME SOLUTION
2.0000 mg/kg | INTRAVENOUS | Status: DC
  Filled 2024-04-04: qty 1.48

## 2024-04-04 MED ORDER — CEFAZOLIN SODIUM-DEXTROSE 2-4 GM/100ML-% IV SOLN
2.0000 g | INTRAVENOUS | Status: AC
  Administered 2024-04-05: 2 g via INTRAVENOUS
  Filled 2024-04-04: qty 100

## 2024-04-04 MED ORDER — MANNITOL 20 % IV SOLN
INTRAVENOUS | Status: DC
  Filled 2024-04-04: qty 13

## 2024-04-04 MED ORDER — PHENYLEPHRINE HCL-NACL 20-0.9 MG/250ML-% IV SOLN
30.0000 ug/min | INTRAVENOUS | Status: DC
  Filled 2024-04-04: qty 250

## 2024-04-04 MED ORDER — SODIUM BICARBONATE 8.4 % IV SOLN
INTRAVENOUS | Status: DC
  Filled 2024-04-04: qty 2.5

## 2024-04-04 MED ORDER — DEXMEDETOMIDINE HCL IN NACL 400 MCG/100ML IV SOLN
0.1000 ug/kg/h | INTRAVENOUS | Status: AC
  Administered 2024-04-05: .4 ug/kg/h via INTRAVENOUS
  Filled 2024-04-04: qty 100

## 2024-04-04 MED ORDER — PLASMA-LYTE A IV SOLN
INTRAVENOUS | Status: DC
  Filled 2024-04-04: qty 2.5

## 2024-04-04 MED ORDER — MILRINONE LACTATE IN DEXTROSE 20-5 MG/100ML-% IV SOLN
0.3000 ug/kg/min | INTRAVENOUS | Status: DC
  Filled 2024-04-04: qty 100

## 2024-04-04 MED ORDER — HEPARIN 30,000 UNITS/1000 ML (OHS) CELLSAVER SOLUTION
Status: DC
  Filled 2024-04-04: qty 1000

## 2024-04-04 NOTE — Progress Notes (Signed)
 Pt's UA resulted with trace leukocytes. Allyssa Krusinski, surgical scheduler for Dr. Daniel, notified.

## 2024-04-04 NOTE — Progress Notes (Signed)
 PCP - Oliva Sprinkles [facility]) Cardiologist - Rejan Revankar (last seen 03/02/25)  PPM/ICD - Denies  Chest x-ray - 04/04/2024 EKG - 04/04/2024 Stress Test - denies ECHO - 11/19/2023 Cardiac Cath - 03/14/2024 Dopplers - 04/04/2024  Sleep Study - denies   No DM.  Last dose of GLP1 agonist-  Denies  Blood Thinner Instructions: pt reports last dose of eliquis  was 04/01/24 Aspirin  Instructions: do not take morning of surgery  ERAS Protcol - NPO  COVID TEST- NA   Anesthesia review: no  Patient denies shortness of breath, fever, cough and chest pain at PAT appointment. Pt reports mild chronic congestion/AM cough, instructed to call if any further or worsening symptoms develop.    All instructions explained to the patient, with a verbal understanding of the material. Patient agrees to go over the instructions while at home for a better understanding. The opportunity to ask questions was provided.

## 2024-04-04 NOTE — Progress Notes (Signed)
 Pre-CABG testing has been completed. Preliminary results can be found in CV Proc through chart review.   04/04/24 10:26 AM Cathlyn Collet RVT

## 2024-04-05 ENCOUNTER — Inpatient Hospital Stay (HOSPITAL_COMMUNITY)

## 2024-04-05 ENCOUNTER — Encounter (HOSPITAL_COMMUNITY): Payer: Self-pay

## 2024-04-05 ENCOUNTER — Inpatient Hospital Stay (HOSPITAL_COMMUNITY): Admission: RE | Admit: 2024-04-05 | Discharge: 2024-04-11 | DRG: 236 | Disposition: A

## 2024-04-05 ENCOUNTER — Inpatient Hospital Stay (HOSPITAL_COMMUNITY): Payer: Self-pay

## 2024-04-05 ENCOUNTER — Inpatient Hospital Stay (HOSPITAL_COMMUNITY): Admission: RE | Payer: Self-pay | Source: Home / Self Care

## 2024-04-05 DIAGNOSIS — I340 Nonrheumatic mitral (valve) insufficiency: Secondary | ICD-10-CM | POA: Diagnosis present

## 2024-04-05 DIAGNOSIS — Z885 Allergy status to narcotic agent status: Secondary | ICD-10-CM | POA: Diagnosis not present

## 2024-04-05 DIAGNOSIS — Z7901 Long term (current) use of anticoagulants: Secondary | ICD-10-CM | POA: Diagnosis not present

## 2024-04-05 DIAGNOSIS — Z7982 Long term (current) use of aspirin: Secondary | ICD-10-CM

## 2024-04-05 DIAGNOSIS — M25512 Pain in left shoulder: Secondary | ICD-10-CM | POA: Diagnosis present

## 2024-04-05 DIAGNOSIS — I480 Paroxysmal atrial fibrillation: Secondary | ICD-10-CM | POA: Diagnosis present

## 2024-04-05 DIAGNOSIS — Z833 Family history of diabetes mellitus: Secondary | ICD-10-CM | POA: Diagnosis not present

## 2024-04-05 DIAGNOSIS — Z8249 Family history of ischemic heart disease and other diseases of the circulatory system: Secondary | ICD-10-CM | POA: Diagnosis not present

## 2024-04-05 DIAGNOSIS — Z9049 Acquired absence of other specified parts of digestive tract: Secondary | ICD-10-CM

## 2024-04-05 DIAGNOSIS — Z79899 Other long term (current) drug therapy: Secondary | ICD-10-CM

## 2024-04-05 DIAGNOSIS — E872 Acidosis, unspecified: Secondary | ICD-10-CM | POA: Diagnosis not present

## 2024-04-05 DIAGNOSIS — I10 Essential (primary) hypertension: Secondary | ICD-10-CM | POA: Diagnosis present

## 2024-04-05 DIAGNOSIS — F05 Delirium due to known physiological condition: Secondary | ICD-10-CM | POA: Diagnosis not present

## 2024-04-05 DIAGNOSIS — K5900 Constipation, unspecified: Secondary | ICD-10-CM | POA: Diagnosis not present

## 2024-04-05 DIAGNOSIS — I471 Supraventricular tachycardia, unspecified: Secondary | ICD-10-CM | POA: Diagnosis present

## 2024-04-05 DIAGNOSIS — R739 Hyperglycemia, unspecified: Secondary | ICD-10-CM | POA: Diagnosis not present

## 2024-04-05 DIAGNOSIS — E871 Hypo-osmolality and hyponatremia: Secondary | ICD-10-CM | POA: Diagnosis not present

## 2024-04-05 DIAGNOSIS — Z882 Allergy status to sulfonamides status: Secondary | ICD-10-CM

## 2024-04-05 DIAGNOSIS — I25119 Atherosclerotic heart disease of native coronary artery with unspecified angina pectoris: Principal | ICD-10-CM | POA: Diagnosis present

## 2024-04-05 DIAGNOSIS — I2510 Atherosclerotic heart disease of native coronary artery without angina pectoris: Secondary | ICD-10-CM

## 2024-04-05 DIAGNOSIS — Z888 Allergy status to other drugs, medicaments and biological substances status: Secondary | ICD-10-CM | POA: Diagnosis not present

## 2024-04-05 DIAGNOSIS — Z8679 Personal history of other diseases of the circulatory system: Secondary | ICD-10-CM

## 2024-04-05 DIAGNOSIS — E861 Hypovolemia: Secondary | ICD-10-CM | POA: Diagnosis not present

## 2024-04-05 DIAGNOSIS — K219 Gastro-esophageal reflux disease without esophagitis: Secondary | ICD-10-CM | POA: Diagnosis present

## 2024-04-05 DIAGNOSIS — K5903 Drug induced constipation: Secondary | ICD-10-CM | POA: Diagnosis not present

## 2024-04-05 DIAGNOSIS — Z951 Presence of aortocoronary bypass graft: Secondary | ICD-10-CM | POA: Diagnosis not present

## 2024-04-05 DIAGNOSIS — G5692 Unspecified mononeuropathy of left upper limb: Secondary | ICD-10-CM | POA: Diagnosis present

## 2024-04-05 DIAGNOSIS — I4430 Unspecified atrioventricular block: Secondary | ICD-10-CM

## 2024-04-05 HISTORY — DX: Presence of aortocoronary bypass graft: Z95.1

## 2024-04-05 LAB — POCT I-STAT, CHEM 8
BUN: 20 mg/dL (ref 8–23)
BUN: 20 mg/dL (ref 8–23)
BUN: 18 mg/dL (ref 8–23)
BUN: 17 mg/dL (ref 8–23)
BUN: 17 mg/dL (ref 8–23)
Calcium, Ion: 1.26 mmol/L (ref 1.15–1.40)
Calcium, Ion: 1.23 mmol/L (ref 1.15–1.40)
Calcium, Ion: 1.11 mmol/L — ABNORMAL LOW (ref 1.15–1.40)
Calcium, Ion: 1.06 mmol/L — ABNORMAL LOW (ref 1.15–1.40)
Calcium, Ion: 1.04 mmol/L — ABNORMAL LOW (ref 1.15–1.40)
Chloride: 106 mmol/L (ref 98–111)
Chloride: 108 mmol/L (ref 98–111)
Chloride: 103 mmol/L (ref 98–111)
Chloride: 103 mmol/L (ref 98–111)
Chloride: 105 mmol/L (ref 98–111)
Creatinine, Ser: 0.8 mg/dL (ref 0.44–1.00)
Creatinine, Ser: 0.9 mg/dL (ref 0.44–1.00)
Creatinine, Ser: 0.8 mg/dL (ref 0.44–1.00)
Creatinine, Ser: 0.7 mg/dL (ref 0.44–1.00)
Creatinine, Ser: 0.7 mg/dL (ref 0.44–1.00)
Glucose, Bld: 117 mg/dL — ABNORMAL HIGH (ref 70–99)
Glucose, Bld: 125 mg/dL — ABNORMAL HIGH (ref 70–99)
Glucose, Bld: 141 mg/dL — ABNORMAL HIGH (ref 70–99)
Glucose, Bld: 138 mg/dL — ABNORMAL HIGH (ref 70–99)
Glucose, Bld: 125 mg/dL — ABNORMAL HIGH (ref 70–99)
HCT: 28 % — ABNORMAL LOW (ref 36.0–46.0)
HCT: 26 % — ABNORMAL LOW (ref 36.0–46.0)
HCT: 24 % — ABNORMAL LOW (ref 36.0–46.0)
HCT: 23 % — ABNORMAL LOW (ref 36.0–46.0)
HCT: 22 % — ABNORMAL LOW (ref 36.0–46.0)
Hemoglobin: 9.5 g/dL — ABNORMAL LOW (ref 12.0–15.0)
Hemoglobin: 8.8 g/dL — ABNORMAL LOW (ref 12.0–15.0)
Hemoglobin: 8.2 g/dL — ABNORMAL LOW (ref 12.0–15.0)
Hemoglobin: 7.8 g/dL — ABNORMAL LOW (ref 12.0–15.0)
Hemoglobin: 7.5 g/dL — ABNORMAL LOW (ref 12.0–15.0)
Potassium: 4.4 mmol/L (ref 3.5–5.1)
Potassium: 4.8 mmol/L (ref 3.5–5.1)
Potassium: 4.9 mmol/L (ref 3.5–5.1)
Potassium: 5.3 mmol/L — ABNORMAL HIGH (ref 3.5–5.1)
Potassium: 4.7 mmol/L (ref 3.5–5.1)
Sodium: 141 mmol/L (ref 135–145)
Sodium: 136 mmol/L (ref 135–145)
Sodium: 138 mmol/L (ref 135–145)
Sodium: 137 mmol/L (ref 135–145)
Sodium: 139 mmol/L (ref 135–145)
TCO2: 24 mmol/L (ref 22–32)
TCO2: 25 mmol/L (ref 22–32)
TCO2: 25 mmol/L (ref 22–32)
TCO2: 24 mmol/L (ref 22–32)
TCO2: 21 mmol/L — ABNORMAL LOW (ref 22–32)

## 2024-04-05 LAB — POCT I-STAT 7, (LYTES, BLD GAS, ICA,H+H)
Acid-base deficit: 4 mmol/L — ABNORMAL HIGH (ref 0.0–2.0)
Acid-base deficit: 2 mmol/L (ref 0.0–2.0)
Acid-base deficit: 1 mmol/L (ref 0.0–2.0)
Acid-base deficit: 3 mmol/L — ABNORMAL HIGH (ref 0.0–2.0)
Acid-base deficit: 3 mmol/L — ABNORMAL HIGH (ref 0.0–2.0)
Acid-base deficit: 4 mmol/L — ABNORMAL HIGH (ref 0.0–2.0)
Acid-base deficit: 7 mmol/L — ABNORMAL HIGH (ref 0.0–2.0)
Acid-base deficit: 9 mmol/L — ABNORMAL HIGH (ref 0.0–2.0)
Bicarbonate: 21.3 mmol/L (ref 20.0–28.0)
Bicarbonate: 22.8 mmol/L (ref 20.0–28.0)
Bicarbonate: 23.2 mmol/L (ref 20.0–28.0)
Bicarbonate: 21.4 mmol/L (ref 20.0–28.0)
Bicarbonate: 20.1 mmol/L (ref 20.0–28.0)
Bicarbonate: 20.4 mmol/L (ref 20.0–28.0)
Bicarbonate: 19.3 mmol/L — ABNORMAL LOW (ref 20.0–28.0)
Bicarbonate: 16.2 mmol/L — ABNORMAL LOW (ref 20.0–28.0)
Calcium, Ion: 1.24 mmol/L (ref 1.15–1.40)
Calcium, Ion: 1.01 mmol/L — ABNORMAL LOW (ref 1.15–1.40)
Calcium, Ion: 1.03 mmol/L — ABNORMAL LOW (ref 1.15–1.40)
Calcium, Ion: 1.02 mmol/L — ABNORMAL LOW (ref 1.15–1.40)
Calcium, Ion: 1.04 mmol/L — ABNORMAL LOW (ref 1.15–1.40)
Calcium, Ion: 1.11 mmol/L — ABNORMAL LOW (ref 1.15–1.40)
Calcium, Ion: 1.22 mmol/L (ref 1.15–1.40)
Calcium, Ion: 1.06 mmol/L — ABNORMAL LOW (ref 1.15–1.40)
HCT: 29 % — ABNORMAL LOW (ref 36.0–46.0)
HCT: 22 % — ABNORMAL LOW (ref 36.0–46.0)
HCT: 22 % — ABNORMAL LOW (ref 36.0–46.0)
HCT: 23 % — ABNORMAL LOW (ref 36.0–46.0)
HCT: 25 % — ABNORMAL LOW (ref 36.0–46.0)
HCT: 24 % — ABNORMAL LOW (ref 36.0–46.0)
HCT: 21 % — ABNORMAL LOW (ref 36.0–46.0)
HCT: 16 % — ABNORMAL LOW (ref 36.0–46.0)
Hemoglobin: 9.9 g/dL — ABNORMAL LOW (ref 12.0–15.0)
Hemoglobin: 7.5 g/dL — ABNORMAL LOW (ref 12.0–15.0)
Hemoglobin: 7.5 g/dL — ABNORMAL LOW (ref 12.0–15.0)
Hemoglobin: 7.8 g/dL — ABNORMAL LOW (ref 12.0–15.0)
Hemoglobin: 8.5 g/dL — ABNORMAL LOW (ref 12.0–15.0)
Hemoglobin: 8.2 g/dL — ABNORMAL LOW (ref 12.0–15.0)
Hemoglobin: 7.1 g/dL — ABNORMAL LOW (ref 12.0–15.0)
Hemoglobin: 5.4 g/dL — CL (ref 12.0–15.0)
O2 Saturation: 100 %
O2 Saturation: 100 %
O2 Saturation: 100 %
O2 Saturation: 100 %
O2 Saturation: 100 %
O2 Saturation: 100 %
O2 Saturation: 98 %
O2 Saturation: 98 %
Patient temperature: 37
Patient temperature: 36.6
Patient temperature: 36.5
Potassium: 4.4 mmol/L (ref 3.5–5.1)
Potassium: 5.1 mmol/L (ref 3.5–5.1)
Potassium: 5.4 mmol/L — ABNORMAL HIGH (ref 3.5–5.1)
Potassium: 5.6 mmol/L — ABNORMAL HIGH (ref 3.5–5.1)
Potassium: 4.7 mmol/L (ref 3.5–5.1)
Potassium: 4.6 mmol/L (ref 3.5–5.1)
Potassium: 4.4 mmol/L (ref 3.5–5.1)
Potassium: 3.6 mmol/L (ref 3.5–5.1)
Sodium: 140 mmol/L (ref 135–145)
Sodium: 138 mmol/L (ref 135–145)
Sodium: 136 mmol/L (ref 135–145)
Sodium: 137 mmol/L (ref 135–145)
Sodium: 138 mmol/L (ref 135–145)
Sodium: 139 mmol/L (ref 135–145)
Sodium: 140 mmol/L (ref 135–145)
Sodium: 142 mmol/L (ref 135–145)
TCO2: 23 mmol/L (ref 22–32)
TCO2: 24 mmol/L (ref 22–32)
TCO2: 24 mmol/L (ref 22–32)
TCO2: 22 mmol/L (ref 22–32)
TCO2: 21 mmol/L — ABNORMAL LOW (ref 22–32)
TCO2: 21 mmol/L — ABNORMAL LOW (ref 22–32)
TCO2: 21 mmol/L — ABNORMAL LOW (ref 22–32)
TCO2: 17 mmol/L — ABNORMAL LOW (ref 22–32)
pCO2 arterial: 40.8 mmHg (ref 32–48)
pCO2 arterial: 38.5 mmHg (ref 32–48)
pCO2 arterial: 35.5 mmHg (ref 32–48)
pCO2 arterial: 33.3 mmHg (ref 32–48)
pCO2 arterial: 29.4 mmHg — ABNORMAL LOW (ref 32–48)
pCO2 arterial: 35.1 mmHg (ref 32–48)
pCO2 arterial: 41 mmHg (ref 32–48)
pCO2 arterial: 29.9 mmHg — ABNORMAL LOW (ref 32–48)
pH, Arterial: 7.326 — ABNORMAL LOW (ref 7.35–7.45)
pH, Arterial: 7.381 (ref 7.35–7.45)
pH, Arterial: 7.422 (ref 7.35–7.45)
pH, Arterial: 7.416 (ref 7.35–7.45)
pH, Arterial: 7.443 (ref 7.35–7.45)
pH, Arterial: 7.373 (ref 7.35–7.45)
pH, Arterial: 7.279 — ABNORMAL LOW (ref 7.35–7.45)
pH, Arterial: 7.34 — ABNORMAL LOW (ref 7.35–7.45)
pO2, Arterial: 430 mmHg — ABNORMAL HIGH (ref 83–108)
pO2, Arterial: 385 mmHg — ABNORMAL HIGH (ref 83–108)
pO2, Arterial: 409 mmHg — ABNORMAL HIGH (ref 83–108)
pO2, Arterial: 381 mmHg — ABNORMAL HIGH (ref 83–108)
pO2, Arterial: 177 mmHg — ABNORMAL HIGH (ref 83–108)
pO2, Arterial: 174 mmHg — ABNORMAL HIGH (ref 83–108)
pO2, Arterial: 114 mmHg — ABNORMAL HIGH (ref 83–108)
pO2, Arterial: 114 mmHg — ABNORMAL HIGH (ref 83–108)

## 2024-04-05 LAB — BASIC METABOLIC PANEL WITH GFR
Anion gap: 10 (ref 5–15)
BUN: 15 mg/dL (ref 8–23)
CO2: 23 mmol/L (ref 22–32)
Calcium: 7.9 mg/dL — ABNORMAL LOW (ref 8.9–10.3)
Chloride: 105 mmol/L (ref 98–111)
Creatinine, Ser: 0.71 mg/dL (ref 0.44–1.00)
GFR, Estimated: >60 mL/min
Glucose, Bld: 146 mg/dL — ABNORMAL HIGH (ref 70–99)
Potassium: 4.5 mmol/L (ref 3.5–5.1)
Sodium: 138 mmol/L (ref 135–145)

## 2024-04-05 LAB — HEMOGLOBIN AND HEMATOCRIT, BLOOD
HCT: 22.9 % — ABNORMAL LOW (ref 36.0–46.0)
Hemoglobin: 7.7 g/dL — ABNORMAL LOW (ref 12.0–15.0)

## 2024-04-05 LAB — POCT I-STAT EG7
Acid-base deficit: 1 mmol/L (ref 0.0–2.0)
Bicarbonate: 23.6 mmol/L (ref 20.0–28.0)
Calcium, Ion: 1.06 mmol/L — ABNORMAL LOW (ref 1.15–1.40)
HCT: 22 % — ABNORMAL LOW (ref 36.0–46.0)
Hemoglobin: 7.5 g/dL — ABNORMAL LOW (ref 12.0–15.0)
O2 Saturation: 82 %
Potassium: 4.9 mmol/L (ref 3.5–5.1)
Sodium: 138 mmol/L (ref 135–145)
TCO2: 25 mmol/L (ref 22–32)
pCO2, Ven: 40.5 mmHg — ABNORMAL LOW (ref 44–60)
pH, Ven: 7.374 (ref 7.25–7.43)
pO2, Ven: 47 mmHg — ABNORMAL HIGH (ref 32–45)

## 2024-04-05 LAB — CBC
HCT: 27.4 % — ABNORMAL LOW (ref 36.0–46.0)
HCT: 24.4 % — ABNORMAL LOW (ref 36.0–46.0)
Hemoglobin: 9.4 g/dL — ABNORMAL LOW (ref 12.0–15.0)
Hemoglobin: 8.1 g/dL — ABNORMAL LOW (ref 12.0–15.0)
MCH: 29.5 pg (ref 26.0–34.0)
MCH: 29.3 pg (ref 26.0–34.0)
MCHC: 34.3 g/dL (ref 30.0–36.0)
MCHC: 33.2 g/dL (ref 30.0–36.0)
MCV: 85.9 fL (ref 80.0–100.0)
MCV: 88.4 fL (ref 80.0–100.0)
Platelets: 171 K/uL (ref 150–400)
Platelets: 126 K/uL — ABNORMAL LOW (ref 150–400)
RBC: 3.19 MIL/uL — ABNORMAL LOW (ref 3.87–5.11)
RBC: 2.76 MIL/uL — ABNORMAL LOW (ref 3.87–5.11)
RDW: 14.5 % (ref 11.5–15.5)
RDW: 14.6 % (ref 11.5–15.5)
WBC: 16.8 K/uL — ABNORMAL HIGH (ref 4.0–10.5)
WBC: 11.6 K/uL — ABNORMAL HIGH (ref 4.0–10.5)
nRBC: 0 % (ref 0.0–0.2)
nRBC: 0 % (ref 0.0–0.2)

## 2024-04-05 LAB — PLATELET COUNT: Platelets: 192 K/uL (ref 150–400)

## 2024-04-05 LAB — ECHO INTRAOPERATIVE TEE
Height: 70 in
Weight: 2603.19 [oz_av]

## 2024-04-05 LAB — GLUCOSE, CAPILLARY
Glucose-Capillary: 136 mg/dL — ABNORMAL HIGH (ref 70–99)
Glucose-Capillary: 124 mg/dL — ABNORMAL HIGH (ref 70–99)
Glucose-Capillary: 129 mg/dL — ABNORMAL HIGH (ref 70–99)
Glucose-Capillary: 119 mg/dL — ABNORMAL HIGH (ref 70–99)
Glucose-Capillary: 177 mg/dL — ABNORMAL HIGH (ref 70–99)
Glucose-Capillary: 146 mg/dL — ABNORMAL HIGH (ref 70–99)

## 2024-04-05 LAB — FIBRINOGEN: Fibrinogen: 581 mg/dL — ABNORMAL HIGH (ref 210–475)

## 2024-04-05 LAB — PROTIME-INR
INR: 1.4 — ABNORMAL HIGH (ref 0.8–1.2)
Prothrombin Time: 17.8 s — ABNORMAL HIGH (ref 11.4–15.2)

## 2024-04-05 LAB — PREPARE RBC (CROSSMATCH)

## 2024-04-05 LAB — APTT: aPTT: 37 s — ABNORMAL HIGH (ref 24–36)

## 2024-04-05 LAB — MAGNESIUM: Magnesium: 3.2 mg/dL — ABNORMAL HIGH (ref 1.7–2.4)

## 2024-04-05 MED ORDER — CHLORHEXIDINE GLUCONATE 4 % EX SOLN: 30.0000 mL | CUTANEOUS | Status: DC

## 2024-04-05 MED ORDER — HEMOSTATIC AGENTS (NO CHARGE) OPTIME
TOPICAL | Status: DC | PRN
  Administered 2024-04-05: 1 via TOPICAL

## 2024-04-05 MED ORDER — MAGNESIUM SULFATE 4 GM/100ML IV SOLN
4.0000 g | Freq: Once | INTRAVENOUS | Status: AC
  Administered 2024-04-05: 4 g via INTRAVENOUS
  Filled 2024-04-05: qty 100

## 2024-04-05 MED ORDER — FENTANYL CITRATE (PF) 50 MCG/ML IJ SOSY
25.0000 ug | PREFILLED_SYRINGE | INTRAMUSCULAR | Status: DC | PRN
  Administered 2024-04-05: 25 ug via INTRAVENOUS
  Filled 2024-04-05 (×2): qty 1

## 2024-04-05 MED ORDER — NOREPINEPHRINE 4 MG/250ML-% IV SOLN: 0.0000 ug/min | INTRAVENOUS | Status: DC

## 2024-04-05 MED ORDER — PHENYLEPHRINE 80 MCG/ML (10ML) SYRINGE FOR IV PUSH (FOR BLOOD PRESSURE SUPPORT)
PREFILLED_SYRINGE | INTRAVENOUS | Status: AC
  Filled 2024-04-05: qty 10

## 2024-04-05 MED ORDER — BISACODYL 5 MG PO TBEC
10.0000 mg | DELAYED_RELEASE_TABLET | Freq: Every day | ORAL | Status: DC
  Administered 2024-04-06 – 2024-04-08 (×3): 10 mg via ORAL
  Filled 2024-04-05 (×3): qty 2

## 2024-04-05 MED ORDER — POTASSIUM CHLORIDE 10 MEQ/50ML IV SOLN: 10.0000 meq | INTRAVENOUS | Status: AC

## 2024-04-05 MED ORDER — ~~LOC~~ CARDIAC SURGERY, PATIENT & FAMILY EDUCATION
Freq: Once | Status: DC
  Filled 2024-04-05: qty 1

## 2024-04-05 MED ORDER — ROSUVASTATIN CALCIUM 20 MG PO TABS
20.0000 mg | ORAL_TABLET | Freq: Every day | ORAL | Status: AC
  Administered 2024-04-06 – 2024-04-11 (×6): 20 mg via ORAL
  Filled 2024-04-05 (×6): qty 1

## 2024-04-05 MED ORDER — PROPOFOL 10 MG/ML IV BOLUS
INTRAVENOUS | Status: DC | PRN
  Administered 2024-04-05: 50 mg via INTRAVENOUS

## 2024-04-05 MED ORDER — ASPIRIN 81 MG PO CHEW: 324.0000 mg | CHEWABLE_TABLET | Freq: Every day | ORAL | Status: DC

## 2024-04-05 MED ORDER — ROCURONIUM BROMIDE 10 MG/ML (PF) SYRINGE
PREFILLED_SYRINGE | INTRAVENOUS | Status: AC
  Filled 2024-04-05: qty 10

## 2024-04-05 MED ORDER — SODIUM CHLORIDE 0.45 % IV SOLN: INTRAVENOUS | Status: AC | PRN

## 2024-04-05 MED ORDER — ASPIRIN 325 MG PO TBEC
325.0000 mg | DELAYED_RELEASE_TABLET | Freq: Every day | ORAL | Status: DC
  Administered 2024-04-06 – 2024-04-09 (×4): 325 mg via ORAL
  Filled 2024-04-05 (×4): qty 1

## 2024-04-05 MED ORDER — PLASMA-LYTE A IV SOLN
INTRAVENOUS | Status: DC | PRN
  Administered 2024-04-05: 500 mL via INTRAVASCULAR

## 2024-04-05 MED ORDER — CHLORHEXIDINE GLUCONATE CLOTH 2 % EX PADS
6.0000 | MEDICATED_PAD | Freq: Every day | CUTANEOUS | Status: DC
  Administered 2024-04-06 – 2024-04-10 (×4): 6 via TOPICAL

## 2024-04-05 MED ORDER — PROTAMINE SULFATE 10 MG/ML IV SOLN
INTRAVENOUS | Status: DC | PRN
  Administered 2024-04-05: 300 mg via INTRAVENOUS

## 2024-04-05 MED ORDER — FENTANYL CITRATE (PF) 250 MCG/5ML IJ SOLN
INTRAMUSCULAR | Status: AC
  Filled 2024-04-05: qty 5

## 2024-04-05 MED ORDER — METOPROLOL TARTRATE 5 MG/5ML IV SOLN: 2.5000 mg | INTRAVENOUS | Status: DC | PRN

## 2024-04-05 MED ORDER — LACTATED RINGERS IV SOLN: INTRAVENOUS | Status: DC

## 2024-04-05 MED ORDER — THROMBIN (RECOMBINANT) 20000 UNITS EX SOLR
CUTANEOUS | Status: AC
  Filled 2024-04-05: qty 20000

## 2024-04-05 MED ORDER — HEPARIN SODIUM (PORCINE) 1000 UNIT/ML IJ SOLN
INTRAMUSCULAR | Status: AC
  Filled 2024-04-05: qty 1

## 2024-04-05 MED ORDER — MIDAZOLAM HCL (PF) 5 MG/ML IJ SOLN
INTRAMUSCULAR | Status: DC | PRN
  Administered 2024-04-05: 2 mg via INTRAVENOUS

## 2024-04-05 MED ORDER — CALCIUM GLUCONATE-NACL 2-0.675 GM/100ML-% IV SOLN
2.0000 g | Freq: Once | INTRAVENOUS | Status: AC
  Administered 2024-04-05: 2000 mg via INTRAVENOUS
  Filled 2024-04-05: qty 100

## 2024-04-05 MED ORDER — BISACODYL 10 MG RE SUPP: 10.0000 mg | Freq: Every day | RECTAL | Status: DC

## 2024-04-05 MED ORDER — SODIUM CHLORIDE 0.9 % IV SOLN
0.3000 ug/kg | Freq: Once | INTRAVENOUS | Status: AC
  Administered 2024-04-05: 22.139999999999997 ug via INTRAVENOUS
  Filled 2024-04-05: qty 5.5

## 2024-04-05 MED ORDER — EPHEDRINE 5 MG/ML INJ
INTRAVENOUS | Status: AC
  Filled 2024-04-05: qty 5

## 2024-04-05 MED ORDER — SODIUM CHLORIDE 0.9% FLUSH
3.0000 mL | Freq: Two times a day (BID) | INTRAVENOUS | Status: DC
  Administered 2024-04-06 – 2024-04-10 (×9): 3 mL via INTRAVENOUS

## 2024-04-05 MED ORDER — CLEVIDIPINE BUTYRATE 0.5 MG/ML IV EMUL: 0.0000 mg/h | INTRAVENOUS | Status: DC

## 2024-04-05 MED ORDER — LIDOCAINE 2% (20 MG/ML) 5 ML SYRINGE
INTRAMUSCULAR | Status: AC
  Filled 2024-04-05: qty 5

## 2024-04-05 MED ORDER — ASPIRIN 81 MG PO CHEW
324.0000 mg | CHEWABLE_TABLET | Freq: Once | ORAL | Status: AC
  Administered 2024-04-05: 324 mg via ORAL
  Filled 2024-04-05: qty 4

## 2024-04-05 MED ORDER — ONDANSETRON HCL 4 MG/2ML IJ SOLN
4.0000 mg | Freq: Four times a day (QID) | INTRAMUSCULAR | Status: DC | PRN
  Administered 2024-04-05 – 2024-04-06 (×2): 4 mg via INTRAVENOUS
  Filled 2024-04-05 (×2): qty 2

## 2024-04-05 MED ORDER — ALBUMIN HUMAN 5 % IV SOLN
250.0000 mL | INTRAVENOUS | Status: AC | PRN
  Administered 2024-04-05 (×5): 12.5 g via INTRAVENOUS
  Filled 2024-04-05 (×4): qty 250

## 2024-04-05 MED ORDER — ORAL CARE MOUTH RINSE: 15.0000 mL | Freq: Once | OROMUCOSAL | Status: AC

## 2024-04-05 MED ORDER — THROMBIN 20000 UNITS EX SOLR: OROMUCOSAL | Status: DC | PRN

## 2024-04-05 MED ORDER — LACTATED RINGERS IV SOLN: INTRAVENOUS | Status: AC

## 2024-04-05 MED ORDER — LACTATED RINGERS IV SOLN: INTRAVENOUS | Status: DC | PRN

## 2024-04-05 MED ORDER — METOCLOPRAMIDE HCL 5 MG/ML IJ SOLN
10.0000 mg | Freq: Four times a day (QID) | INTRAMUSCULAR | Status: AC
  Administered 2024-04-05 – 2024-04-07 (×6): 10 mg via INTRAVENOUS
  Filled 2024-04-05 (×6): qty 2

## 2024-04-05 MED ORDER — METOPROLOL TARTRATE 12.5 MG HALF TABLET
ORAL_TABLET | ORAL | Status: AC
  Filled 2024-04-05: qty 1

## 2024-04-05 MED ORDER — PROPOFOL 500 MG/50ML IV EMUL
INTRAVENOUS | Status: DC | PRN
  Administered 2024-04-05: 50 ug/kg/min via INTRAVENOUS

## 2024-04-05 MED ORDER — PANTOPRAZOLE SODIUM 40 MG PO TBEC: 40.0000 mg | DELAYED_RELEASE_TABLET | Freq: Every day | ORAL | Status: DC

## 2024-04-05 MED ORDER — CHLORHEXIDINE GLUCONATE 0.12 % MT SOLN
15.0000 mL | OROMUCOSAL | Status: AC
  Administered 2024-04-05: 15 mL via OROMUCOSAL
  Filled 2024-04-05: qty 15

## 2024-04-05 MED ORDER — DEXTROSE 50 % IV SOLN: 0.0000 mL | INTRAVENOUS | Status: DC | PRN

## 2024-04-05 MED ORDER — ACETAMINOPHEN 500 MG PO TABS
1000.0000 mg | ORAL_TABLET | Freq: Four times a day (QID) | ORAL | Status: AC
  Administered 2024-04-05 – 2024-04-10 (×19): 1000 mg via ORAL
  Filled 2024-04-05 (×19): qty 2

## 2024-04-05 MED ORDER — CALCIUM CHLORIDE 10 % IV SOLN
INTRAVENOUS | Status: DC | PRN
  Administered 2024-04-05: 100 mg via INTRAVENOUS

## 2024-04-05 MED ORDER — 0.9 % SODIUM CHLORIDE (POUR BTL) OPTIME
TOPICAL | Status: DC | PRN
  Administered 2024-04-05: 5000 mL

## 2024-04-05 MED ORDER — PROPOFOL 10 MG/ML IV BOLUS
INTRAVENOUS | Status: AC
  Filled 2024-04-05: qty 20

## 2024-04-05 MED ORDER — MIDAZOLAM HCL (PF) 2 MG/2ML IJ SOLN: 2.0000 mg | INTRAMUSCULAR | Status: DC | PRN

## 2024-04-05 MED ORDER — OXYCODONE HCL 5 MG PO TABS
5.0000 mg | ORAL_TABLET | ORAL | Status: DC | PRN
  Administered 2024-04-05 – 2024-04-06 (×3): 10 mg via ORAL
  Administered 2024-04-06: 5 mg via ORAL
  Administered 2024-04-06 – 2024-04-07 (×5): 10 mg via ORAL
  Filled 2024-04-05 (×9): qty 2

## 2024-04-05 MED ORDER — ACETAMINOPHEN 160 MG/5ML PO SOLN
650.0000 mg | Freq: Once | ORAL | Status: AC
  Administered 2024-04-05: 650 mg
  Filled 2024-04-05: qty 20.3

## 2024-04-05 MED ORDER — SODIUM CHLORIDE 0.9 % IV SOLN: 250.0000 mL | INTRAVENOUS | Status: AC

## 2024-04-05 MED ORDER — CEFAZOLIN SODIUM-DEXTROSE 2-4 GM/100ML-% IV SOLN
2.0000 g | Freq: Three times a day (TID) | INTRAVENOUS | Status: AC
  Administered 2024-04-05 – 2024-04-07 (×6): 2 g via INTRAVENOUS
  Filled 2024-04-05 (×6): qty 100

## 2024-04-05 MED ORDER — FENTANYL CITRATE (PF) 250 MCG/5ML IJ SOLN
INTRAMUSCULAR | Status: DC | PRN
  Administered 2024-04-05: 100 ug via INTRAVENOUS
  Administered 2024-04-05 (×2): 50 ug via INTRAVENOUS
  Administered 2024-04-05: 200 ug via INTRAVENOUS
  Administered 2024-04-05 (×4): 50 ug via INTRAVENOUS

## 2024-04-05 MED ORDER — SODIUM CHLORIDE 0.9 % IV SOLN: INTRAVENOUS | Status: AC

## 2024-04-05 MED ORDER — HEPARIN SODIUM (PORCINE) 1000 UNIT/ML IJ SOLN
INTRAMUSCULAR | Status: DC | PRN
  Administered 2024-04-05: 30000 [IU] via INTRAVENOUS

## 2024-04-05 MED ORDER — SODIUM CHLORIDE 0.9 % IV SOLN: INTRAVENOUS | Status: DC | PRN

## 2024-04-05 MED ORDER — ACETAMINOPHEN 160 MG/5ML PO SOLN: 1000.0000 mg | Freq: Four times a day (QID) | ORAL | Status: DC

## 2024-04-05 MED ORDER — PANTOPRAZOLE SODIUM 40 MG IV SOLR
40.0000 mg | Freq: Every day | INTRAVENOUS | Status: AC
  Administered 2024-04-05 – 2024-04-06 (×2): 40 mg via INTRAVENOUS
  Filled 2024-04-05 (×2): qty 10

## 2024-04-05 MED ORDER — MIDAZOLAM HCL 2 MG/2ML IJ SOLN
INTRAMUSCULAR | Status: AC
  Filled 2024-04-05: qty 2

## 2024-04-05 MED ORDER — DEXMEDETOMIDINE HCL IN NACL 400 MCG/100ML IV SOLN: 0.0000 ug/kg/h | INTRAVENOUS | Status: DC

## 2024-04-05 MED ORDER — SUGAMMADEX SODIUM 200 MG/2ML IV SOLN
INTRAVENOUS | Status: DC | PRN
  Administered 2024-04-05: 200 mg via INTRAVENOUS

## 2024-04-05 MED ORDER — CHLORHEXIDINE GLUCONATE 0.12 % MT SOLN
OROMUCOSAL | Status: AC
  Administered 2024-04-05: 15 mL via OROMUCOSAL
  Filled 2024-04-05: qty 15

## 2024-04-05 MED ORDER — METOPROLOL TARTRATE 25 MG/10 ML ORAL SUSPENSION: 12.5000 mg | Freq: Two times a day (BID) | ORAL | Status: DC

## 2024-04-05 MED ORDER — METOPROLOL TARTRATE 12.5 MG HALF TABLET: 12.5000 mg | ORAL_TABLET | Freq: Once | ORAL | Status: DC

## 2024-04-05 MED ORDER — INSULIN REGULAR(HUMAN) IN NACL 100-0.9 UT/100ML-% IV SOLN: INTRAVENOUS | Status: DC

## 2024-04-05 MED ORDER — FENTANYL CITRATE (PF) 50 MCG/ML IJ SOSY
25.0000 ug | PREFILLED_SYRINGE | INTRAMUSCULAR | Status: DC | PRN
  Administered 2024-04-05 (×3): 25 ug via INTRAVENOUS
  Filled 2024-04-05 (×2): qty 1

## 2024-04-05 MED ORDER — CHLORHEXIDINE GLUCONATE 0.12 % MT SOLN: 15.0000 mL | Freq: Once | OROMUCOSAL | Status: AC

## 2024-04-05 MED ORDER — VANCOMYCIN HCL IN DEXTROSE 1-5 GM/200ML-% IV SOLN
1000.0000 mg | Freq: Once | INTRAVENOUS | Status: AC
  Administered 2024-04-05: 1000 mg via INTRAVENOUS
  Filled 2024-04-05: qty 200

## 2024-04-05 MED ORDER — PHENYLEPHRINE 80 MCG/ML (10ML) SYRINGE FOR IV PUSH (FOR BLOOD PRESSURE SUPPORT)
PREFILLED_SYRINGE | INTRAVENOUS | Status: DC | PRN
  Administered 2024-04-05: 160 ug via INTRAVENOUS
  Administered 2024-04-05 (×3): 80 ug via INTRAVENOUS

## 2024-04-05 MED ORDER — SODIUM CHLORIDE 0.9% FLUSH: 3.0000 mL | INTRAVENOUS | Status: DC | PRN

## 2024-04-05 MED ORDER — PROTAMINE SULFATE 10 MG/ML IV SOLN
INTRAVENOUS | Status: AC
  Filled 2024-04-05: qty 25

## 2024-04-05 MED ORDER — DOCUSATE SODIUM 100 MG PO CAPS
200.0000 mg | ORAL_CAPSULE | Freq: Every day | ORAL | Status: AC
  Administered 2024-04-06 – 2024-04-11 (×5): 200 mg via ORAL
  Filled 2024-04-05 (×5): qty 2

## 2024-04-05 MED ORDER — LIDOCAINE 2% (20 MG/ML) 5 ML SYRINGE
INTRAMUSCULAR | Status: DC | PRN
  Administered 2024-04-05: 100 mg via INTRAVENOUS

## 2024-04-05 MED ORDER — ROCURONIUM BROMIDE 10 MG/ML (PF) SYRINGE
PREFILLED_SYRINGE | INTRAVENOUS | Status: DC | PRN
  Administered 2024-04-05: 50 mg via INTRAVENOUS
  Administered 2024-04-05: 100 mg via INTRAVENOUS
  Administered 2024-04-05: 20 mg via INTRAVENOUS

## 2024-04-05 MED ORDER — METOPROLOL TARTRATE 12.5 MG HALF TABLET
12.5000 mg | ORAL_TABLET | Freq: Two times a day (BID) | ORAL | Status: DC
  Administered 2024-04-06 – 2024-04-10 (×9): 12.5 mg via ORAL
  Filled 2024-04-05 (×9): qty 1

## 2024-04-05 MED ORDER — EPHEDRINE SULFATE-NACL 50-0.9 MG/10ML-% IV SOSY
PREFILLED_SYRINGE | INTRAVENOUS | Status: DC | PRN
  Administered 2024-04-05 (×2): 5 mg via INTRAVENOUS
  Administered 2024-04-05: 10 mg via INTRAVENOUS
  Administered 2024-04-05: 5 mg via INTRAVENOUS

## 2024-04-05 MED ORDER — ALBUMIN HUMAN 5 % IV SOLN: INTRAVENOUS | Status: DC | PRN

## 2024-04-05 MED FILL — Potassium Chloride Inj 2 mEq/ML: INTRAVENOUS | Qty: 40 | Status: AC

## 2024-04-05 MED FILL — Heparin Sodium (Porcine) Inj 1000 Unit/ML: Qty: 1000 | Status: AC

## 2024-04-05 MED FILL — Lidocaine HCl Local Preservative Free (PF) Inj 2%: INTRAMUSCULAR | Qty: 14 | Status: AC

## 2024-04-05 NOTE — Anesthesia Preprocedure Evaluation (Signed)
 "                                  Anesthesia Evaluation  Patient identified by MRN, date of birth, ID band Patient awake    Reviewed: Allergy & Precautions, NPO status , Patient's Chart, lab work & pertinent test results, reviewed documented beta blocker date and time   History of Anesthesia Complications (+) PONV and history of anesthetic complications  Airway Mallampati: II  TM Distance: >3 FB     Dental no notable dental hx.    Pulmonary neg COPD   breath sounds clear to auscultation       Cardiovascular hypertension, + CAD and + DOE  + dysrhythmias  Rhythm:Regular Rate:Normal  Angiographic data: LM: Long vessel, distal left main has a 50% stenosis. LAD: Large vessel, ostial mild to moderate calcification evident with a 75% stenosis.  Large D1 with a proximal 70% stenosis and there is a large D2.  LAD after D2 in the midsegment has long segment 80% stenosis. RI: Small vessel. LCx: Gives origin to large OM1, large OM 3.  After small OM 2, there is a focal 80% stenosis. RCA: Very large caliber vessel.  Moderate disease is evident in the proximal to mid segment of 30 to 40% followed by a 80% stenosis in the midsegment.  Distal RCA has a diffuse 60% stenosis followed by a very large PL branch with multiple secondary branches which has proximal/ostial 80% stenosis.  IMPRESSIONS     1. Left ventricular ejection fraction, by estimation, is 60 to 65%. Left  ventricular ejection fraction by 3D volume is 61 %. The left ventricle has  normal function. The left ventricle has no regional wall motion  abnormalities. Left ventricular diastolic   parameters are consistent with Grade I diastolic dysfunction (impaired  relaxation). The average left ventricular global longitudinal strain is  23.3 %. The global longitudinal strain is normal.   2. Right ventricular systolic function is normal. The right ventricular  size is normal. There is normal pulmonary artery systolic  pressure.   3. The mitral valve is normal in structure. Mild to moderate mitral valve  regurgitation. No evidence of mitral stenosis.   4. The aortic valve is normal in structure. Aortic valve regurgitation is  mild. No aortic stenosis is present.   5. The inferior vena cava is normal in size with greater than 50%  respiratory variability, suggesting right atrial pressure of 3 mmHg.     Neuro/Psych neg Seizures  Anxiety        GI/Hepatic ,GERD  ,,(+) neg Cirrhosis        Endo/Other    Renal/GU Renal disease     Musculoskeletal  (+) Arthritis ,    Abdominal   Peds  Hematology   Anesthesia Other Findings   Reproductive/Obstetrics                              Anesthesia Physical Anesthesia Plan  ASA: 4  Anesthesia Plan: General   Post-op Pain Management:    Induction: Intravenous  PONV Risk Score and Plan: 3 and Propofol  infusion and Ondansetron   Airway Management Planned: Oral ETT  Additional Equipment: Arterial line, CVP, 3D TEE and Ultrasound Guidance Line Placement  Intra-op Plan:   Post-operative Plan: Post-operative intubation/ventilation  Informed Consent: I have reviewed the patients History and Physical, chart, labs and discussed  the procedure including the risks, benefits and alternatives for the proposed anesthesia with the patient or authorized representative who has indicated his/her understanding and acceptance.     Dental advisory given  Plan Discussed with: CRNA  Anesthesia Plan Comments:          Anesthesia Quick Evaluation  "

## 2024-04-05 NOTE — Consult Note (Addendum)
 "  NAME:  Ann Diaz, MRN:  [id number], DOB:  02-Sep-1945, LOS: 0 ADMISSION DATE:  04/05/2024, CONSULTATION DATE:  1/21 REFERRING MD:  SU, CHIEF COMPLAINT:  post CABG critical care    History of Present Illness:  79 year old female w/ prior h/o HTN, GERD and mild MR. Hospitalized Nov 2025 w/ new onset AF.  As part of evaluation underwent Coronary CT  12/17 and found to have svr CAD and subsequently underwent diagnostic left heart cath on 12/18. : Left main 50%, LAD 75%, D1 70%, Distal Cx 80%, Prox RCA 80%; TTE (11/20/23): Normal BiV function, mild-mod mitral regurgitation w/ hemodynamically significant prox LAD stenosis. Because she also had class  III angina was referred to cardiothoracic surgery. She was evaluated on 1/15, DOAC for her AF placed on hold and she presented for CABG w/ LA appendage clipping   Note multiple allergies:  Sulfa Antibiotics Codeine Morphine  Amiodarone  Meperidine Tramadol Hcl   OR events Bypass time 2h 26 m Clamp time 1hr 20 m EBL 885 Blood products/cell saver: 482 cell saver Fluids 2500 crystalloid/250 ml albumin    -tissue friable and oozy  -intra op nml bivent fxn w/ no valve issues  Pertinent  Medical History  PAF, HTN, GERD, mild MR.   Significant Hospital Events: Including procedures, antibiotic start and stop dates in addition to other pertinent events   1/21 3V CABG w left atrial appendage clipping   Interim History / Subjective:  Arrived on unit  Output of CTs  120 since arrival and 70 over last hour MAP currently < goal. She has received 2 rounds of albumin  thus far.  Current CVP 9 Started NE at 3 mcg/min w/ map now at goal  She is now following commands Post-op labs reviewed Hgb 7.5->8.5->9.4 Plts 171  Abg 7.44/29/177/bicarb 21 iCal 1.04-> have ordered 2 gms Ca gluc   Objective    Blood pressure (!) 143/62, pulse 64, temperature 97.6 F (36.4 C), temperature source Oral, resp. rate 10, height 5' 10 (1.778 m), weight 73.8 kg, SpO2  94%.        Intake/Output Summary (Last 24 hours) at 04/05/2024 1115 Last data filed at 04/05/2024 1105 Gross per 24 hour  Intake 1953.6 ml  Output 200 ml  Net 1753.6 ml   Filed Weights   04/05/24 0644  Weight: 73.8 kg    Examination: General: 79 year old female sedated on dex. No distress HENT: NCAT orally intubated. Internal jugular right sided CVLunremarkable  Lungs: clear equal bilaterally Currently on full vent support w/ plan to transition to weaning protocol ETT good position. Left vol loss. Right CVL good position Cardiovascular: RRR mid sternal dressing CD&I. CTs total 120 so far. Pericardial pm on standbye  Abdomen: soft  Extremities: warm no edema right SVG site dressing w/ ace and CD&I Neuro: moving all ext no focal def dex at o.2 GU: clear yellow   Resolved problem list   Assessment and Plan   Multi-vessel CAD now s/p CABG X 3 w/ left atrial appendage clipping w/ right SVG plan Post op abg, chem and Cbc reviewed, Getting 2 gms calcium  given low iCa and concern for bleeding Cont Passive rewarming and cont to wean  precedex  Continue post operative telemetry monitoring for conduction disturbances Ensure temporary epicardial pacing wires in place with back up rate for symptomatic bradycardia or AVB MAP goal 65-80;  w/ PRN NE and albumin , inoptropes and antihypertensive management as indicated.  Ensure euvolemic Close observation for evidence of bleeding, including  chest tube output and surgical site.Monitor Chest tube output if  >400cc in 1 hr, >300 for 2 consecutive hours, >200cc for three consecutive hours notify surgical team Tight glycemic control Multi-modal analgesia  SCDs immediate post-op; w/ DVT prophylaxis vs systemic AC to be initiated at surgical team discretion  Complete prophylactic antibiotics    H/o afib Plan Tele  Rate control  Holding [facility] w/ time to resume per Cardiac surg   Post op Vent management  Plan F/u pcxr and abg Initiate wean  protocol once hemodynamics stable and normothermic VAP bundle  Expected acute blood loss anemia  Baseline hgb: 12.1 Plan Trend CBC Transfuse for active bleeding or hgb < 9    Labs   CBC: Recent Labs  Lab 04/04/24 0900 04/05/24 0906 04/05/24 0909 04/05/24 1043  WBC 6.5  --   --   --   HGB 12.1 9.5* 9.9* 8.8*  HCT 36.4 28.0* 29.0* 26.0*  MCV 87.3  --   --   --   PLT 232  --   --   --     Basic Metabolic Panel: Recent Labs  Lab 04/04/24 0900 04/05/24 0906 04/05/24 0909 04/05/24 1043  NA 137 141 140 136  K 5.0 4.4 4.4 4.8  CL 103 106  --  108  CO2 24  --   --   --   GLUCOSE 90 117*  --  125*  BUN 19 20  --  20  CREATININE 0.88 0.80  --  0.90  CALCIUM  9.5  --   --   --    GFR: Estimated Creatinine Clearance: 57.5 mL/min (by C-G formula based on SCr of 0.9 mg/dL). Recent Labs  Lab 04/04/24 0900  WBC 6.5    Liver Function Tests: Recent Labs  Lab 04/04/24 0900  AST 24  ALT 17  ALKPHOS 63  BILITOT 0.5  PROT 6.9  ALBUMIN  4.2   No results for input(s): LIPASE, AMYLASE in the last 168 hours. No results for input(s): AMMONIA in the last 168 hours.  ABG    Component Value Date/Time   PHART 7.326 (L) 04/05/2024 0909   PCO2ART 40.8 04/05/2024 0909   PO2ART 430 (H) 04/05/2024 0909   HCO3 21.3 04/05/2024 0909   TCO2 25 04/05/2024 1043   ACIDBASEDEF 4.0 (H) 04/05/2024 0909   O2SAT 100 04/05/2024 0909     Coagulation Profile: Recent Labs  Lab 04/04/24 0900  INR 1.0    Cardiac Enzymes: No results for input(s): CKTOTAL, CKMB, CKMBINDEX, TROPONINI in the last 168 hours.  HbA1C: Hgb A1c MFr Bld  Date/Time Value Ref Range Status  04/04/2024 09:00 AM 5.3 4.8 - 5.6 % Final    Comment:    (NOTE) Diagnosis of Diabetes The following HbA1c ranges recommended by the American Diabetes Association (ADA) may be used as an aid in the diagnosis of diabetes mellitus.  Hemoglobin             Suggested A1C NGSP%              Diagnosis  <5.7                    Non Diabetic  5.7-6.4                Pre-Diabetic  >6.4                   Diabetic  <7.0  Glycemic control for                       adults with diabetes.      CBG: No results for input(s): GLUCAP in the last 168 hours.  Review of Systems:   Not able   Past Medical History:  She,  has a past medical history of Anxiety, Arthritis, Coronary artery disease, Dysrhythmia, Essential hypertension (10/28/2023), GERD (gastroesophageal reflux disease), Hypertension, Lumbar pseudoarthrosis (07/31/2021), Moderate mitral regurgitation (01/11/2024), New onset atrial fibrillation (HCC) (10/28/2023), PONV (postoperative nausea and vomiting), and Spondylolisthesis, lumbar region (11/25/2020).   Surgical History:   Past Surgical History:  Procedure Laterality Date   BACK SURGERY     11/25/2020   CHOLECYSTECTOMY     EYE SURGERY     FRACTURE SURGERY Left    left wrist   LEFT HEART CATH AND CORONARY ANGIOGRAPHY N/A 03/14/2024   Procedure: LEFT HEART CATH AND CORONARY ANGIOGRAPHY;  Surgeon: Ladona Heinz, MD;  Location: [facility] INVASIVE CV LAB;  Service: Cardiovascular;  Laterality: N/A;   TUBAL LIGATION       Social History:   reports that she has never smoked. She has never used smokeless tobacco. She reports that she does not drink alcohol and does not use drugs.   Family History:  Her family history includes Diabetes in her brother; Heart attack in her brother; Hypertension in her brother, father, and mother. There is no history of Cancer.   Allergies Allergies[1]   Home Medications  Prior to Admission medications  Medication Sig Start Date End Date Taking? Authorizing Provider  acetaminophen  (TYLENOL ) 650 MG CR tablet Take 1,300 mg by mouth at bedtime.   Yes [provider]  ALPRAZolam  (XANAX ) 0.5 MG tablet Take 0.5 mg by mouth 2 (two) times daily as needed for anxiety. 07/09/20  Yes [provider]  amLODipine  (NORVASC ) 2.5 MG tablet Take 1  tablet (2.5 mg total) by mouth daily. 03/14/24 03/14/25 Yes Ladona Heinz, MD  apixaban  (ELIQUIS ) 5 MG TABS tablet Take 1 tablet (5 mg total) by mouth 2 (two) times daily. 03/15/24  Yes Ladona Heinz, MD  aspirin  EC 81 MG tablet Take 81 mg by mouth daily. Swallow whole.   Yes [provider]  Biotin 5000 MCG CAPS Take 5,000 mcg by mouth daily.   Yes [provider]  Calcium  Carb-Cholecalciferol (CALCIUM  600+D3 PO) Take 1 tablet by mouth in the morning.   Yes [provider]  diclofenac Sodium (VOLTAREN) 1 % GEL Apply 1 Application topically daily as needed (shoulder pain).   Yes [provider]  dronedarone  (MULTAQ ) 400 MG tablet Take 1 tablet (400 mg total) by mouth 2 (two) times daily with a meal. 03/02/24  Yes Revankar, Jennifer SAUNDERS, MD  famotidine  (PEPCID ) 20 MG tablet Take 20 mg by mouth daily as needed for heartburn or indigestion.   Yes [provider]  fexofenadine (ALLEGRA) 180 MG tablet Take 180 mg by mouth daily as needed for allergies or rhinitis.   Yes [provider]  Landy Tea, Camellia sinensis, (GREEN TEA PO) Take 2 tablets by mouth daily. Fat burner   Yes [provider]  isosorbide  mononitrate (IMDUR ) 30 MG 24 hr tablet Take 1 tablet (30 mg total) by mouth daily. 03/14/24 03/14/25 Yes Ladona Heinz, MD  metoprolol  succinate (TOPROL -XL) 50 MG 24 hr tablet Take 1 tablet (50 mg total) by mouth daily. Take with or immediately following a meal. 10/28/23 04/05/24 Yes Revankar, Jennifer SAUNDERS, MD  nitroGLYCERIN  (NITROSTAT ) 0.4 MG SL tablet Place 1 tablet (0.4 mg total) under the tongue every 5 (five) minutes as needed for chest pain. 03/14/24 04/08/24 Yes Ladona Heinz, MD  oxymetazoline  (AFRIN) 0.05 % nasal spray Place 1-2 sprays into both nostrils daily as needed for congestion.   Yes [provider]  rosuvastatin  (CRESTOR ) 20 MG tablet Take 1 tablet (20 mg total) by mouth daily. 03/02/24 05/31/24 Yes Revankar, Jennifer SAUNDERS, MD  cyclobenzaprine   (FLEXERIL ) 5 MG tablet Take 1 tablet (5 mg total) by mouth 3 (three) times daily as needed for muscle spasms. Patient taking differently: Take 5 mg by mouth at bedtime as needed for muscle spasms. 08/01/21   Mavis Purchase, MD     Critical care time:   I personally  spent 32 minutes  on this patient which included: review of medical records, nursing notes, progress notes, evaluation, interpretation of lab data and diagnostic studies, taking independent history, performing exam, documenting plan, ordering diagnostics and interventions for the following critical care issues: Circulatory shock, Acute respiratory failure with the following interventions which included: titration of ventilatory support, titration of hemodynamic drips to desired MAP               [1]  Allergies Allergen Reactions   Sulfa Antibiotics Nausea And Vomiting and Other (See Comments)    hypokalemia SEVERE hypokalemia    Codeine Nausea And Vomiting   Morphine  Nausea And Vomiting   Amiodarone  Nausea And Vomiting   Meperidine Hives   Tramadol Hcl Itching   "

## 2024-04-05 NOTE — Anesthesia Postprocedure Evaluation (Signed)
"   Anesthesia Post Note  Patient: Ann Diaz  Procedure(s) Performed: CORONARY ARTERY BYPASS GRAFTING X 3, USING LEFT INTERNAL MAMMARY ARTERY AND RIGHT ENDOSCOPIC HARVESTED GREATER SAPHENOUS VEIN (Chest) CLIPPING, LEFT ATRIAL APPENDAGE USING ATRICLIP ECHOCARDIOGRAM, TRANSESOPHAGEAL, INTRAOPERATIVE ISOLATION, VEIN, PULMONARY, BILATERAL     Patient location during evaluation: ICU Anesthesia Type: General Level of consciousness: sedated Pain management: pain level controlled Vital Signs Assessment: post-procedure vital signs reviewed and stable Respiratory status: patient remains intubated per anesthesia plan and patient on ventilator - see flowsheet for VS Cardiovascular status: stable Postop Assessment: no apparent nausea or vomiting Anesthetic complications: no   No notable events documented.  Last Vitals:  Vitals:   04/05/24 0809 04/05/24 1524  BP:    Pulse: 64 64  Resp: 10 16  Temp:    SpO2: 94% 96%    Last Pain:  Vitals:   04/05/24 0644  TempSrc: Oral  PainSc: 0-No pain                 Ann Diaz      "

## 2024-04-05 NOTE — Interval H&P Note (Signed)
 History and Physical Interval Note:  04/05/2024 7:24 AM  Ann Diaz  has presented today for surgery, with the diagnosis of CAD AFIB.  The various methods of treatment have been discussed with the patient and family. After consideration of risks, benefits and other options for treatment, the patient has consented to  Procedures with comments: CORONARY ARTERY BYPASS GRAFTING (CABG) (N/A) COX MAZE PROCEDURE (N/A) - ENCOMPASS CLIP CLIPPING, LEFT ATRIAL APPENDAGE (N/A) ECHOCARDIOGRAM, TRANSESOPHAGEAL, INTRAOPERATIVE (N/A) as a surgical intervention.  The patient's history has been reviewed, patient examined, no change in status, stable for surgery.  I have reviewed the patient's chart and labs.  Questions were answered to the patient's satisfaction.     Con RAMAN Ann Diaz

## 2024-04-05 NOTE — Hospital Course (Addendum)
" °  Referring: Ladona Heinz, MD Primary Care: Fernand Tracey LABOR, MD Primary Cardiologist:None     History of Present Illness:     Ms. Boldin is a 79 year old woman with history of paroxysmal atrial fibrillation hypertension GERD and mild mitral regurgitation.  She presented with atrial fibrillation initially which prompted a CT coronary scan.  This demonstrated significant calcium  and subsequent left heart cath showed multivessel coronary artery disease.   She has been having shortness of breath and syncope.  Her shortness of breath has improved with management of her A-fib.  A Holter study back in October demonstrated a 2% burden of atrial fibrillation.  She was most recently put on Multaq  by Dr. Edwyna.  She was previously on amiodarone  and then flecainide .   She also reports left shoulder pain that radiates from the back to the front that improves with laying flat.  She saw an orthopedist who said it was a pinched nerve and she has been trying physical therapy but it has not yet resolved.  Of note, she walks with a significant limp.  She does not know why she has a limp but she is able to ambulate without any assistive devices.   She is fully functional and independent in her ADLs.  She goes to the gym twice a week for exercise class.  She is a never smoker.  Following full review pf the patient and all relevant studies the patient was recommended CABG.  Hospital course:  The patient was admitted electively and on 04/05/2024 taken to the operating room at which time she underwent CABG x 3.  A SVG-PL, SVG-D1, and LIMA-LAD grafts were placed.  Additionally she underwent occlusion of the left atrial appendage with a 35 mm flex mini atrial clip.  Also pulmonary vein isolation with encompass clamp.  Patient tolerated procedure well and was taken to the surgical intensive care unit in stable condition.   Postoperative hospital course:  The patient has done well.  She was extubated using standard  postcardiac surgical protocols under the care of the CCM team.  She has remained neurologically intact.  On postop day 1 she was hemodynamically stable requiring no inotropic or vasopressor support.  She is noted to be intolerant to amiodarone  and was on the Multaq  preoperatively for atrial fibrillation and this has been restarted.  Renal function has remained within normal limits.  She did require routine diuresis for volume overload not related to congestive failure but to routine cardiac surgery status.  She was transitioned off the Insulin  drip. Her pre op HGA1C was 5.4. Accu checks and SS PRN will be stopped after transfer to the floor.  She did require intraoperative transfusion of 1 unit of packed cells.  She does have an expected acute blood loss anemia and hemoglobin/hematocrit are being monitored. She was stared on Ferrous sulfate . She was diuresed with IV Lasix  starting on 01/23. She was given scheduled Reglan  and Zofran  PRN for nausea. She had hyponatremia;likely secondary to water  intake and volume excess. She had delirium overnight and was also agitated;however, both gradually improved. EPWs and chest tubes were removed on POD 3. Foley and sleeve were removed on POD 4. She was stable for transfer from the ICU to 4E for further convalescence on 01/25. PT/OT were ordered as she will likely need CIR when ready for discharge.         "

## 2024-04-05 NOTE — Brief Op Note (Signed)
 04/05/2024  8:30 AM  8:16 AM  PATIENT:  Ann Diaz  79 y.o. female  PRE-OPERATIVE DIAGNOSIS:  CORONARY ARTERY DISEASE, ATRIAL FIBRILLATION  POST-OPERATIVE DIAGNOSIS:  CORONARY ARTERY DISEASE, ATRIAL FIBRILLATION  PROCEDURE:  Procedures: CORONARY ARTERY BYPASS GRAFTING X 3, USING LEFT INTERNAL MAMMARY ARTERY AND RIGHT ENDOSCOPIC HARVESTED GREATER SAPHENOUS VEIN (N/A) CLIPPING, LEFT ATRIAL APPENDAGE USING ATRICLIP (N/A) ECHOCARDIOGRAM, TRANSESOPHAGEAL, INTRAOPERATIVE (N/A) ISOLATION, VEIN, PULMONARY, BILATERAL Vein harvest time: Vein prep time: SVG-PL, SVG-D1, LIMA-LAD  SURGEON:  Surgeons and Role:    * Su, Con RAMAN, MD - Primary  PHYSICIAN ASSISTANT: Chavon Lucarelli PA-C  ASSISTANTS: MALLORY HARDY RNFA   ANESTHESIA:   general  EBL:  885 mL   BLOOD ADMINISTERED:315 CC PRBC  DRAINS: LEFT PLEURAL AND MEDIASTINAL CHEST TUBES   LOCAL MEDICATIONS USED:  NONE  SPECIMEN:  No Specimen  DISPOSITION OF SPECIMEN:  N/A  COUNTS:  YES  TOURNIQUET:  * No tourniquets in log *  DICTATION: .Dragon Dictation  PLAN OF CARE: Admit to inpatient   PATIENT DISPOSITION:  ICU - intubated and hemodynamically stable.   Delay start of Pharmacological VTE agent (>24hrs) due to surgical blood loss or risk of bleeding: yes

## 2024-04-05 NOTE — Anesthesia Procedure Notes (Signed)
 Procedure Name: Intubation Date/Time: 04/05/2024 8:58 AM  Performed by: Elby Raelene SAUNDERS, CRNAPre-anesthesia Checklist: Patient identified, Emergency Drugs available, Suction available and Patient being monitored Patient Re-evaluated:Patient Re-evaluated prior to induction Oxygen Delivery Method: Circle System Utilized Preoxygenation: Pre-oxygenation with 100% oxygen Induction Type: IV induction Ventilation: Mask ventilation without difficulty Laryngoscope Size: Miller and 2 Grade View: Grade I Tube type: Oral Tube size: 8.0 mm Number of attempts: 1 Airway Equipment and Method: Stylet Placement Confirmation: ETT inserted through vocal cords under direct vision, positive ETCO2 and breath sounds checked- equal and bilateral Secured at: 22 cm Tube secured with: Tape Dental Injury: Teeth and Oropharynx as per pre-operative assessment

## 2024-04-05 NOTE — Anesthesia Procedure Notes (Signed)
 Arterial Line Insertion Start/End1/21/2026 7:40 AM, 04/05/2024 7:45 AM Performed by: Keneth Lynwood POUR, MD, Zelphia Norleen HERO, CRNA, CRNA  Patient location: Pre-op. Preanesthetic checklist: patient identified, IV checked, site marked, risks and benefits discussed, surgical consent, monitors and equipment checked, pre-op evaluation, timeout performed and anesthesia consent Lidocaine  1% used for infiltration Left, radial was placed Catheter size: 20 G Hand hygiene performed  and maximum sterile barriers used   Attempts: 1 Procedure performed using ultrasound to evaluate access site. Ultrasound Notes:relevant anatomy identified, ultrasound used to visualize needle entry and vessel patent under ultrasound. Following insertion, dressing applied. Post procedure assessment: normal and unchanged  Patient tolerated the procedure well with no immediate complications.

## 2024-04-05 NOTE — Anesthesia Procedure Notes (Signed)
 Central Venous Catheter Insertion Performed by: Keneth Lynwood POUR, MD, anesthesiologist Start/End1/21/2026 8:10 AM, 04/05/2024 8:15 AM Patient location: Pre-op. Preanesthetic checklist: patient identified, IV checked, site marked, risks and benefits discussed, surgical consent, monitors and equipment checked, pre-op evaluation, timeout performed and anesthesia consent Lidocaine  1% used for infiltration and patient sedated Hand hygiene performed  and maximum sterile barriers used  Catheter size: 8.5 Fr Central line was placed.MAC introducer Procedure performed using ultrasound to evaluate access site. Ultrasound Notes:relevant anatomy identified, ultrasound used to visualize needle entry, vessel patent under ultrasound and image(s) printed for medical record. Attempts: 1 Following insertion, line sutured and dressing applied. Post procedure assessment: blood return through all ports, free fluid flow and no air  Patient tolerated the procedure well with no immediate complications.

## 2024-04-05 NOTE — Transfer of Care (Signed)
 Immediate Anesthesia Transfer of Care Note  Patient: Ann Diaz  Procedure(s) Performed: CORONARY ARTERY BYPASS GRAFTING X 3, USING LEFT INTERNAL MAMMARY ARTERY AND RIGHT ENDOSCOPIC HARVESTED GREATER SAPHENOUS VEIN (Chest) CLIPPING, LEFT ATRIAL APPENDAGE USING ATRICLIP ECHOCARDIOGRAM, TRANSESOPHAGEAL, INTRAOPERATIVE ISOLATION, VEIN, PULMONARY, BILATERAL  Patient Location: SICU  Anesthesia Type:General  Level of Consciousness: sedated and Patient remains intubated per anesthesia plan  Airway & Oxygen Therapy: Patient remains intubated per anesthesia plan and Patient placed on Ventilator (see vital sign flow sheet for setting)  Post-op Assessment: Report given to RN and Post -op Vital signs reviewed and stable  Post vital signs: Reviewed and stable  Last Vitals:  Vitals Value Taken Time  BP    Temp    Pulse 80 04/05/24 15:29  Resp 16 04/05/24 15:28  SpO2 99 % 04/05/24 15:29  Vitals shown include unfiled device data.  Last Pain:  Vitals:   04/05/24 0644  TempSrc: Oral  PainSc: 0-No pain         Complications: No notable events documented.

## 2024-04-05 NOTE — Procedures (Signed)
 Extubation Procedure Note  Patient Details:   Name: Ann Diaz DOB: 1945-07-12 MRN: [id number]   Airway Documentation:    Vent end date: 04/05/24 Vent end time: 1829   Evaluation  O2 sats: stable throughout Complications: No apparent complications Patient did tolerate procedure well. Bilateral Breath Sounds: Clear   Yes patient had audible cuff leak prior to extubation, NIF -40 cmh20, VC 1.6 L/min. Patient had a good strong productive cough and able to state her full name and location.  Placed on 4 L [place] sat 98%, HR 81, RR 20.  Will continue to monitor patient.  [name] 04/05/2024, 6:34 PM

## 2024-04-06 ENCOUNTER — Inpatient Hospital Stay (HOSPITAL_COMMUNITY)

## 2024-04-06 LAB — BASIC METABOLIC PANEL WITH GFR
Anion gap: 9 (ref 5–15)
Anion gap: 8 (ref 5–15)
BUN: 15 mg/dL (ref 8–23)
BUN: 18 mg/dL (ref 8–23)
CO2: 23 mmol/L (ref 22–32)
CO2: 25 mmol/L (ref 22–32)
Calcium: 7.9 mg/dL — ABNORMAL LOW (ref 8.9–10.3)
Calcium: 8.5 mg/dL — ABNORMAL LOW (ref 8.9–10.3)
Chloride: 104 mmol/L (ref 98–111)
Chloride: 102 mmol/L (ref 98–111)
Creatinine, Ser: 0.67 mg/dL (ref 0.44–1.00)
Creatinine, Ser: 0.7 mg/dL (ref 0.44–1.00)
GFR, Estimated: >60 mL/min
GFR, Estimated: >60 mL/min
Glucose, Bld: 139 mg/dL — ABNORMAL HIGH (ref 70–99)
Glucose, Bld: 151 mg/dL — ABNORMAL HIGH (ref 70–99)
Potassium: 4.5 mmol/L (ref 3.5–5.1)
Potassium: 4.8 mmol/L (ref 3.5–5.1)
Sodium: 136 mmol/L (ref 135–145)
Sodium: 134 mmol/L — ABNORMAL LOW (ref 135–145)

## 2024-04-06 LAB — LIPID PANEL
Cholesterol: 52 mg/dL (ref 0–200)
HDL: 29 mg/dL — ABNORMAL LOW
LDL Cholesterol: 7 mg/dL (ref 0–99)
Total CHOL/HDL Ratio: 1.8 ratio
Triglycerides: 79 mg/dL
VLDL: 16 mg/dL (ref 0–40)

## 2024-04-06 LAB — CBC
HCT: 23 % — ABNORMAL LOW (ref 36.0–46.0)
HCT: 24 % — ABNORMAL LOW (ref 36.0–46.0)
Hemoglobin: 7.5 g/dL — ABNORMAL LOW (ref 12.0–15.0)
Hemoglobin: 7.8 g/dL — ABNORMAL LOW (ref 12.0–15.0)
MCH: 28.6 pg (ref 26.0–34.0)
MCH: 29.2 pg (ref 26.0–34.0)
MCHC: 32.6 g/dL (ref 30.0–36.0)
MCHC: 32.5 g/dL (ref 30.0–36.0)
MCV: 87.8 fL (ref 80.0–100.0)
MCV: 89.9 fL (ref 80.0–100.0)
Platelets: 107 K/uL — ABNORMAL LOW (ref 150–400)
Platelets: 116 K/uL — ABNORMAL LOW (ref 150–400)
RBC: 2.62 MIL/uL — ABNORMAL LOW (ref 3.87–5.11)
RBC: 2.67 MIL/uL — ABNORMAL LOW (ref 3.87–5.11)
RDW: 14.9 % (ref 11.5–15.5)
RDW: 15.1 % (ref 11.5–15.5)
WBC: 7.3 K/uL (ref 4.0–10.5)
WBC: 9.6 K/uL (ref 4.0–10.5)
nRBC: 0 % (ref 0.0–0.2)
nRBC: 0 % (ref 0.0–0.2)

## 2024-04-06 LAB — GLUCOSE, CAPILLARY
Glucose-Capillary: 115 mg/dL — ABNORMAL HIGH (ref 70–99)
Glucose-Capillary: 129 mg/dL — ABNORMAL HIGH (ref 70–99)
Glucose-Capillary: 129 mg/dL — ABNORMAL HIGH (ref 70–99)
Glucose-Capillary: 130 mg/dL — ABNORMAL HIGH (ref 70–99)
Glucose-Capillary: 136 mg/dL — ABNORMAL HIGH (ref 70–99)
Glucose-Capillary: 137 mg/dL — ABNORMAL HIGH (ref 70–99)
Glucose-Capillary: 141 mg/dL — ABNORMAL HIGH (ref 70–99)
Glucose-Capillary: 144 mg/dL — ABNORMAL HIGH (ref 70–99)
Glucose-Capillary: 148 mg/dL — ABNORMAL HIGH (ref 70–99)

## 2024-04-06 LAB — MAGNESIUM
Magnesium: 2.6 mg/dL — ABNORMAL HIGH (ref 1.7–2.4)
Magnesium: 2.5 mg/dL — ABNORMAL HIGH (ref 1.7–2.4)

## 2024-04-06 MED ORDER — INSULIN ASPART 100 UNIT/ML IJ SOLN
0.0000 [IU] | INTRAMUSCULAR | Status: DC
  Administered 2024-04-06 (×3): 2 [IU] via SUBCUTANEOUS
  Administered 2024-04-07: 4 [IU] via SUBCUTANEOUS
  Administered 2024-04-07 – 2024-04-08 (×5): 2 [IU] via SUBCUTANEOUS
  Filled 2024-04-06: qty 4
  Filled 2024-04-06: qty 2
  Filled 2024-04-06: qty 5
  Filled 2024-04-06 (×6): qty 2

## 2024-04-06 MED ORDER — PNEUMOCOCCAL 20-VAL CONJ VACC 0.5 ML IM SUSY: 0.5000 mL | PREFILLED_SYRINGE | INTRAMUSCULAR | Status: AC | PRN

## 2024-04-06 MED ORDER — PROCHLORPERAZINE EDISYLATE 10 MG/2ML IJ SOLN
5.0000 mg | Freq: Four times a day (QID) | INTRAMUSCULAR | Status: DC | PRN
  Administered 2024-04-06 – 2024-04-07 (×2): 5 mg via INTRAVENOUS
  Filled 2024-04-06: qty 2

## 2024-04-06 MED ORDER — PROCHLORPERAZINE EDISYLATE 10 MG/2ML IJ SOLN
5.0000 mg | Freq: Four times a day (QID) | INTRAMUSCULAR | Status: DC | PRN
  Administered 2024-04-06: 5 mg via INTRAVENOUS
  Filled 2024-04-06 (×2): qty 2

## 2024-04-06 MED ORDER — ONDANSETRON HCL 4 MG/2ML IJ SOLN
4.0000 mg | Freq: Four times a day (QID) | INTRAMUSCULAR | Status: DC | PRN
  Administered 2024-04-07 (×2): 4 mg via INTRAVENOUS
  Filled 2024-04-06 (×2): qty 2

## 2024-04-06 MED ORDER — DRONEDARONE HCL 400 MG PO TABS
400.0000 mg | ORAL_TABLET | Freq: Two times a day (BID) | ORAL | Status: AC
  Administered 2024-04-06 – 2024-04-11 (×11): 400 mg via ORAL
  Filled 2024-04-06 (×12): qty 1

## 2024-04-06 MED ORDER — LIDOCAINE 5 % EX PTCH
1.0000 | MEDICATED_PATCH | CUTANEOUS | Status: AC
  Administered 2024-04-06 – 2024-04-10 (×3): 1 via TRANSDERMAL
  Filled 2024-04-06 (×3): qty 1

## 2024-04-06 MED FILL — Thrombin (Recombinant) For Soln 20000 Unit: CUTANEOUS | Qty: 1 | Status: AC

## 2024-04-06 NOTE — Progress Notes (Signed)
" ° °  Inpatient Rehab Admissions Coordinator :  Per therapy recommendations, patient was screened for CIR candidacy by Heron Leavell RN MSN. Did very well on eval POD # 1. We await further progress before determining rehab venue needs.  Heron Leavell, RN, MSN Rehab Admissions Coordinator [phone] 04/06/2024 12:13 PM     "

## 2024-04-06 NOTE — Progress Notes (Signed)
 1 Day Post-Op Procedures (LRB): CORONARY ARTERY BYPASS GRAFTING X 3, USING LEFT INTERNAL MAMMARY ARTERY AND RIGHT ENDOSCOPIC HARVESTED GREATER SAPHENOUS VEIN (N/A) CLIPPING, LEFT ATRIAL APPENDAGE USING ATRICLIP (N/A) ECHOCARDIOGRAM, TRANSESOPHAGEAL, INTRAOPERATIVE (N/A) ISOLATION, VEIN, PULMONARY, BILATERAL Subjective: Extubated overnight BP stable, no pressors Hgb 7.5 this morning, chest tube output thin Remains in NSR  Objective: Vital signs in last 24 hours: BP 108/61   Pulse 77   Temp 98.2 F (36.8 C)   Resp 17   Ht 5' 10 (1.778 m)   Wt 80.9 kg   SpO2 97%   BMI 25.59 kg/m  Filed Weights   04/05/24 0644 04/06/24 0600  Weight: 73.8 kg 80.9 kg    Hemodynamic parameters for last 24 hours: CVP:  [0 mmHg-24 mmHg] 9 mmHg CO:  [4 L/min-5.2 L/min] 5.2 L/min CI:  [2.1 L/min/m2-2.7 L/min/m2] 2.7 L/min/m2  Intake/Output from previous day: 01/21 0701 - 01/22 0700 In: 6620.9 [I.V.:3087.4; Blood:482; IV Piggyback:3051.6] Out: 3125 [Urine:1670; Blood:885; Chest Tube:570] Intake/Output this shift: No intake/output data recorded.  Physical Exam: General - Resting comfortably in bed CV - RRR Resp - Unlabored on 3L [place] Abd - Soft, ND/NT Ext - Moderate edema  Lab Results:    Latest Ref Rng & Units 04/06/2024    3:45 AM 04/05/2024    8:29 PM 04/05/2024    8:10 PM  CBC  WBC 4.0 - 10.5 K/uL 7.3  11.6    Hemoglobin 12.0 - 15.0 g/dL 7.5  8.1  5.4   Hematocrit 36.0 - 46.0 % 23.0  24.4  16.0   Platelets 150 - 400 K/uL 107  126        Latest Ref Rng & Units 04/06/2024    3:45 AM 04/05/2024    8:29 PM 04/05/2024    8:10 PM  CMP  Glucose 70 - 99 mg/dL 860  853    BUN 8 - 23 mg/dL 15  15    Creatinine 9.55 - 1.00 mg/dL 9.32  9.28    Sodium 864 - 145 mmol/L 136  138  142   Potassium 3.5 - 5.1 mmol/L 4.5  4.5  3.6   Chloride 98 - 111 mmol/L 104  105    CO2 22 - 32 mmol/L 23  23    Calcium  8.9 - 10.3 mg/dL 7.9  7.9      CXR: No large effusion or  pneumothorax  Assessment/Plan: S/P Procedures (LRB): CORONARY ARTERY BYPASS GRAFTING X 3, USING LEFT INTERNAL MAMMARY ARTERY AND RIGHT ENDOSCOPIC HARVESTED GREATER SAPHENOUS VEIN (N/A) CLIPPING, LEFT ATRIAL APPENDAGE USING ATRICLIP (N/A) ECHOCARDIOGRAM, TRANSESOPHAGEAL, INTRAOPERATIVE (N/A) ISOLATION, VEIN, PULMONARY, BILATERAL POD1 s/p CABG x 3, PVI, LAAL NEURO- intact  Pain control PRN CV- in SR around 70-80 bpm             Cap pacing wires  Continue metoprolol   Will resume Multaq  today, has not tolerated amiodarone  in the past (itchiness, vomiting) RESP- Saturating well on 3L , wean as tolerated             Continue IS, pulm hygiene, ambulation  Keep chest tubes RENAL- creatinine and lytes Ok  Weight 178 lbs from 162 lbs pre-op  No lasix for today yet  Foley will stay GI- tolerating clears             Advance diet  BM: None since surgery Endo- BG well controlled Transition to iSS ID- [id number] DVT ppx - SCD  Dispo: ICU   LOS: 1 day  Con RAMAN Sherion Dooly 04/06/2024

## 2024-04-06 NOTE — Discharge Summary (Signed)
 "      [address] [place] [address]             [phone]        Physician Discharge Summary  Patient ID: Ann Diaz MRN: [id number] DOB/AGE: 77/31/1947 77 y.o.  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   Dyspnea on exertion 03/02/2024   Coronary artery disease 03/02/2024   Encounter for monitoring flecainide  therapy 02/08/2024   DOE (dyspnea on exertion) 02/08/2024   Moderate mitral regurgitation 01/11/2024   New onset atrial fibrillation (HCC) 10/28/2023   Essential hypertension 10/28/2023   Anxiety    Arthritis    GERD (gastroesophageal reflux disease)    Hypertension    PONV (postoperative nausea and vomiting)    Lumbar pseudoarthrosis 07/31/2021   Spondylolisthesis, lumbar region 11/25/2020     Discharge Diagnoses:  Patient Active Problem List   Diagnosis Date Noted   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   Dyspnea on exertion 03/02/2024   Coronary artery disease 03/02/2024   Encounter for monitoring flecainide  therapy 02/08/2024   DOE (dyspnea on exertion) 02/08/2024   Moderate mitral regurgitation 01/11/2024   New onset atrial fibrillation (HCC) 10/28/2023   Essential hypertension 10/28/2023   Anxiety    Arthritis    GERD (gastroesophageal reflux disease)    Hypertension    PONV (postoperative nausea and vomiting)    Lumbar pseudoarthrosis 07/31/2021   Spondylolisthesis, lumbar region 11/25/2020     Discharged Condition: stable   Referring: Ann Heinz, MD Primary Care: Ann Diaz DELENA, MD Primary Cardiologist:None     History of Present Illness:     Ann Diaz is a 79 year old woman with history of paroxysmal atrial fibrillation hypertension GERD and mild mitral regurgitation.  She presented with atrial fibrillation initially which prompted a CT coronary scan.  This demonstrated significant calcium  and subsequent left  heart cath showed multivessel coronary artery disease.   She has been having shortness of breath and syncope.  Her shortness of breath has improved with management of her A-fib.  A Holter study back in October demonstrated a 2% burden of atrial fibrillation.  She was most recently put on Multaq  by Dr. Edwyna.  She was previously on amiodarone  and then flecainide .   She also reports left shoulder pain that radiates from the back to the front that improves with laying flat.  She saw an orthopedist who said it was a pinched nerve and she has been trying physical therapy but it has not yet resolved.  Of note, she walks with a significant limp.  She does not know why she has a limp but she is able to ambulate without any assistive devices.   She is fully functional and independent in her ADLs.  She goes to the gym twice a week for exercise class.  She is a never smoker.  Following full review pf the patient and all relevant studies the patient was recommended CABG.  Hospital course:  The patient was admitted electively and on 04/05/2024 taken to the operating room at which time she underwent CABG x 3.  A SVG-PL, SVG-D1, and LIMA-LAD grafts were placed.  Additionally she underwent occlusion of the left atrial appendage with a 35 mm flex mini atrial clip.  Also pulmonary vein isolation with encompass clamp.  Patient tolerated procedure well and was taken to the surgical  intensive care unit in stable condition.   Postoperative hospital course:  The patient has done well.  She was extubated using standard postcardiac surgical protocols under the care of the CCM team.  She has remained neurologically intact.  On postop day 1 she was hemodynamically stable requiring no inotropic or vasopressor support.  She is noted to be intolerant to amiodarone  and was on the Multaq  preoperatively for atrial fibrillation and this has been restarted.  Eliquis  has also been resumed.  Renal function has remained within normal  limits.  She was transitioned off the Insulin  drip. Her pre op HGA1C was 5.4. Accu checks and SS PRN were stopped after transfer to the floor.  She did require intraoperative transfusion of 1 unit of packed cells.  She does have an expected acute blood loss anemia and hemoglobin/hematocrit are being monitored.  She was stared on Ferrous sulfate . She was diuresed with IV Lasix  starting on 01/23.  This was transitioned to oral dosing will continue for a short period of time at discharge.  She was given scheduled Reglan  and Zofran  PRN for nausea.  This is resolved.  She had hyponatremia;likely secondary to water  intake and volume excess. She had delirium overnight and was also agitated;however, both gradually improved back to patient's baseline.  Initially it was felt she may require CIR but she has progressed quite nicely and home health PT is being arranged.  EPWs and chest tubes were removed on POD 3. Foley and sleeve were removed on POD 4. She was stable for transfer from the ICU to 4E for further convalescence on 01/26.  Incisions are noted to be healing well without evidence of infection.  She is tolerating diet.  Oxygen has been weaned and maintains good saturations on room air.  Overall, at the time of discharge the patient is felt to be quite stable.        Consults: pulmonary/intensive care  Significant Diagnostic Studies:   DG Chest 2 View Result Date: 04/10/2024 CLINICAL DATA:  Cardiac surgery. EXAM: CHEST - 2 VIEW COMPARISON:  04/07/2024. FINDINGS: Support apparatus has been removed in the interval. Trachea is midline. Heart is at the upper limits of normal in size to mildly enlarged. Thoracic aorta is calcified. Seven intact median sternotomy wires. Left atrial appendage clip. Improving bibasilar atelectasis with small bilateral loculated pleural effusions. No pneumothorax. IMPRESSION: Improving bibasilar atelectasis with small loculated bilateral pleural effusions. Electronically Signed   By:  Ann Diaz M.D.   On: 04/10/2024 10:45   DG Chest Port 1 View Result Date: 04/07/2024 CLINICAL DATA:  Status post cardiac surgery. EXAM: PORTABLE CHEST 1 VIEW COMPARISON:  04/06/2024 FINDINGS: No definite left apical pneumothorax on the current study although extreme left apex is not included on the film. Right IJ central line remains in place. Mediastinal/pericardial and left thoracic drains again noted. The cardio pericardial silhouette is enlarged. Lucency along the right heart or has decreased in the interval. Persistent basilar atelectasis with right pleural effusion again noted. Gaseous distention of the stomach evident in the left upper quadrant. IMPRESSION: 1. No definite left apical pneumothorax on the current study although extreme left apex is not included on the film. 2. Persistent basilar atelectasis with right pleural effusion. 3. Decreasing lucency along the right heart border. Electronically Signed   By: Camellia Candle M.D.   On: 04/07/2024 07:40   DG Chest Port 1 View Result Date: 04/06/2024 CLINICAL DATA:  Status post CABG. EXAM: PORTABLE CHEST 1 VIEW COMPARISON:  04/05/2024 FINDINGS:  Interval extubation and NG tube removal. Right IJ central line tip overlies the proximal SVC level. Mediastinal/pericardial and left thoracic drains again noted. Lucency along the right heart were is more conspicuous and may reflect atelectatic lung in the medial right base although pneumomediastinum/pneumopericardium could have this appearance. Probable trace left apical pneumothorax. Bibasilar atelectasis is progressive in the interval with probable tiny bilateral effusions. Interval increase in gaseous distention of the stomach. Telemetry leads overlie the chest. IMPRESSION: 1. Interval extubation and NG tube removal. 2. Possible trace left apical pneumothorax. 3. Lucency along the right heart border is more conspicuous and may reflect atelectatic lung in the medial right base although  pneumomediastinum/pneumopericardium could have this appearance. Close attention on follow-up recommended. 4. Interval increase in gaseous distention of the stomach. Electronically Signed   By: Camellia Candle M.D.   On: 04/06/2024 07:20   DG Chest Port 1 View Result Date: 04/05/2024 CLINICAL DATA:  Post CABG. EXAM: PORTABLE CHEST 1 VIEW COMPARISON:  04/04/2024 FINDINGS: Interval median sternotomy. Endotracheal tube 5.7 cm above the carina. Right IJ central venous catheter with tip over the SVC. Two adjacent mediastinal drains. Apparent enteric tube with side port over the region of the stomach in the left upper quadrant and tip is not visualized. Suggestion of a left basilar chest tube. Lungs are adequately inflated with mild hazy opacification over the left base/retrocardiac region likely small amount left pleural fluid with basilar atelectasis. No evidence of pneumothorax. Right lung is probably clear. Cardiomediastinal silhouette and remainder the exam is unremarkable. IMPRESSION: 1. Mild hazy opacification over the left base/retrocardiac region likely small amount of left pleural fluid with basilar atelectasis. 2. Tubes and lines as described. Electronically Signed   By: Toribio Agreste M.D.   On: 04/05/2024 15:43   ECHO INTRAOPERATIVE TEE Result Date: 04/05/2024  *INTRAOPERATIVE TRANSESOPHAGEAL REPORT *  Patient Name:   Ann Diaz Date of Exam: 04/05/2024 Medical Rec #:  [id number]      Height:       70.0 in Accession #:    [id number]     Weight:       162.7 lb Date of Birth:  28-Jun-1945     BSA:          1.91 m Patient Age:    78 years       BP:           143/62 mmHg Patient Gender: F              HR:           75 bpm. Exam Location:  Anesthesiology Transesophogeal exam was perform intraoperatively during surgical procedure. Patient was closely monitored under general anesthesia during the entirety of examination. Indications:     CABG Performing Phys: [id number] CON RAMAN SU Diagnosing Phys: Lynwood Cornea  Complications: No known complications during this procedure. POST-OP IMPRESSIONS _ Left Ventricle: has normal systolic function. _ Right Ventricle: normal function. The left atrial appendage appears completely occluded. _ Aorta: The aorta appears unchanged from pre-bypass; there is no dissection present in the aorta. _ Aortic Valve: The aortic valve appears unchanged from pre-bypass. There is mild regurgitation. _ Mitral Valve: The mitral valve appears unchanged from pre-bypass. There is mild regurgitation. _ Tricuspid Valve: The tricuspid valve appears unchanged from pre-bypass. There is mild regurgitation. _ Pulmonic Valve: The pulmonic valve appears unchanged from pre-bypass. _ Interatrial Septum: The interatrial septum appears unchanged from pre-bypass. _ Pericardium: The pericardium appears unchanged from pre-bypass. _ Comments: No  significant pericardial or pleural effusion. PRE-OP FINDINGS  Left Ventricle: The left ventricle has normal systolic function, with an ejection fraction of 55-60%. The cavity size was normal. Right Ventricle: The right ventricle has normal systolic function. The cavity was mildly dilated. There is no increase in right ventricular wall thickness. Left Atrium: Left atrial size was not assessed. No left atrial/left atrial appendage thrombus was detected. Right Atrium: Right atrial size was not assessed. Interatrial Septum: No atrial level shunt detected by color flow Doppler. Pericardium: There is no evidence of pericardial effusion. There is no pleural effusion. Mitral Valve: The mitral valve is normal in structure. Mitral valve regurgitation is mild by color flow Doppler. There is no evidence of mitral stenosis. Tricuspid Valve: The tricuspid valve was normal in structure. Tricuspid valve regurgitation is mild by color flow Doppler. No evidence of tricuspid stenosis is present. Aortic Valve: The aortic valve is normal in structure. Aortic valve regurgitation is mild by color flow  Doppler. There is no stenosis of the aortic valve. Pulmonic Valve: The pulmonic valve was normal in structure No evidence of pumonic stenosis. Pulmonic valve regurgitation is mild by color flow Doppler. Aorta: The aortic root, ascending aorta and aortic arch are normal in size and structure.  Lynwood Cornea Electronically signed by Lynwood Cornea Signature Date/Time: 04/05/2024/2:56:54 PM    Final    DG Chest 2 View Result Date: 04/04/2024 CLINICAL DATA:  Preop chest exam. Upcoming cardiac surgery. Coronary artery disease, multi vessel. EXAM: CHEST - 2 VIEW COMPARISON:  Radiograph 01/12/2024, CT 03/01/2024 FINDINGS: The cardiomediastinal contours are normal. Aortic atherosclerosis. The hiatal hernia on prior CT is not demonstrated by radiograph. The lungs are clear. Pulmonary vasculature is normal. No consolidation, pleural effusion, or pneumothorax. No acute osseous abnormalities are seen. Remote right rib fracture. IMPRESSION: No active cardiopulmonary disease. Electronically Signed   By: Andrea Gasman M.D.   On: 04/04/2024 17:20   VAS US  DOPPLER PRE CABG Result Date: 04/04/2024 PREOPERATIVE VASCULAR EVALUATION Patient Name:  Ann Diaz  Date of Exam:   04/04/2024 Medical Rec #: [id number]       Accession #:    [id number] Date of Birth: 30-Oct-1945      Patient Gender: F Patient Age:   62 years Exam Location:  [facility] Procedure:      VAS US  DOPPLER PRE CABG Referring Phys: BAILEY SU --------------------------------------------------------------------------------  Indications:      Pre-CABG. Risk Factors:     Hypertension. Comparison Study: No prior studies. Performing Technologist: Gerome Ny RVT  Examination Guidelines: A complete evaluation includes B-mode imaging, spectral Doppler, color Doppler, and power Doppler as needed of all accessible portions of each vessel. Bilateral testing is considered an integral part of a complete examination. Limited examinations for reoccurring indications may be  performed as noted.  Right Carotid Findings: +----------+--------+--------+--------+-----------------------+--------+           PSV cm/sEDV cm/sStenosisDescribe               Comments +----------+--------+--------+--------+-----------------------+--------+ CCA Prox  98      15              smooth and heterogenous         +----------+--------+--------+--------+-----------------------+--------+ CCA Distal91      19              smooth and heterogenous         +----------+--------+--------+--------+-----------------------+--------+ ICA Prox  92      22  smooth and heterogenous         +----------+--------+--------+--------+-----------------------+--------+ ICA Mid   93      32                                              +----------+--------+--------+--------+-----------------------+--------+ ICA Distal90      24                                              +----------+--------+--------+--------+-----------------------+--------+ ECA       104     8                                               +----------+--------+--------+--------+-----------------------+--------+ +----------+--------+-------+--------+------------+           PSV cm/sEDV cmsDescribeArm Pressure +----------+--------+-------+--------+------------+ Subclavian120                    135          +----------+--------+-------+--------+------------+ +---------+--------+--+--------+--+---------+ VertebralPSV cm/s43EDV cm/s10Antegrade +---------+--------+--+--------+--+---------+ Left Carotid Findings: +----------+--------+--------+--------+-----------------------+--------+           PSV cm/sEDV cm/sStenosisDescribe               Comments +----------+--------+--------+--------+-----------------------+--------+ CCA Prox  118     26              smooth and heterogenous         +----------+--------+--------+--------+-----------------------+--------+ CCA Distal99      22               smooth and heterogenous         +----------+--------+--------+--------+-----------------------+--------+ ICA Prox  103     31              smooth and heterogenous         +----------+--------+--------+--------+-----------------------+--------+ ICA Mid   115     33                                              +----------+--------+--------+--------+-----------------------+--------+ ICA Distal160     40                                     tortuous +----------+--------+--------+--------+-----------------------+--------+ ECA       133     5                                               +----------+--------+--------+--------+-----------------------+--------+ +----------+--------+--------+--------+------------+ SubclavianPSV cm/sEDV cm/sDescribeArm Pressure +----------+--------+--------+--------+------------+           117                     143          +----------+--------+--------+--------+------------+ +---------+--------+--+--------+--+---------+ VertebralPSV cm/s59EDV cm/s20Antegrade +---------+--------+--+--------+--+---------+  ABI Findings: +------------------+-----+---------+ Rt Pressure (mmHg)IndexWaveform  +------------------+-----+---------+ 135  triphasic +------------------+-----+---------+ 165               1.15 triphasic +------------------+-----+---------+ 162               1.13 triphasic +------------------+-----+---------+ +------------------+-----+---------+ Lt Pressure (mmHg)IndexWaveform  +------------------+-----+---------+ 143                    triphasic +------------------+-----+---------+ 174               1.22 triphasic +------------------+-----+---------+ 159               1.11 triphasic +------------------+-----+---------+ +-------+---------------+ ABI/TBIToday's ABI/TBI +-------+---------------+ Right  1.15            +-------+---------------+ Left   1.22             +-------+---------------+  Right Doppler Findings: +--------+--------+---------+ Site    PressureDoppler   +--------+--------+---------+ [id number]     triphasic +--------+--------+---------+ Radial          triphasic +--------+--------+---------+ Ulnar           triphasic +--------+--------+---------+  Left Doppler Findings: +--------+--------+---------+ Site    PressureDoppler   +--------+--------+---------+ [id number]     triphasic +--------+--------+---------+ Radial          triphasic +--------+--------+---------+ Ulnar           triphasic +--------+--------+---------+   Summary: Right Carotid: Velocities in the right ICA are consistent with a 1-39% stenosis. Left Carotid: Velocities in the left ICA are consistent with a 1-39% stenosis. Vertebrals: Bilateral vertebral arteries demonstrate antegrade flow. Right ABI: Resting right ankle-brachial index is within normal range. Left ABI: Resting left ankle-brachial index is within normal range. Right Upper Extremity: Doppler waveform obliterate with right radial compression. Doppler waveforms remain within normal limits with right ulnar compression. Left Upper Extremity: Doppler waveform obliterate with left radial compression. Doppler waveforms remain within normal limits with left ulnar compression.  Electronically signed by Gaile New MD on 04/04/2024 at 12:44:23 PM.    Final    CARDIAC CATHETERIZATION Result Date: 03/14/2024 Images from the original result were not included. Cardiac Catheterization 03/14/2024: Hemodynamic data: LVEDP 19 mmHg, moderately elevated.  No pressure gradient across aortic valve. Angiographic data: LM: Long vessel, distal left main has a 50% stenosis. LAD: Large vessel, ostial mild to moderate calcification evident with a 75% stenosis.  Large D1 with a proximal 70% stenosis and there is a large D2.  LAD after D2 in the midsegment has long segment 80% stenosis. RI: Small vessel. LCx: Gives origin  to large OM1, large OM 3.  After small OM 2, there is a focal 80% stenosis. RCA: Very large caliber vessel.  Moderate disease is evident in the proximal to mid segment of 30 to 40% followed by a 80% stenosis in the midsegment.  Distal RCA has a diffuse 60% stenosis followed by a very large PL branch with multiple secondary branches which has proximal/ostial 80% stenosis. Impression and recommendations: Patient has hemodynamically significant proximal LAD stenosis by CT FFR at 0.8.  Also the lesion appears significant and mildly calcified.  Patient also with class III angina pectoris, given multivessel disease, she would benefit from evaluation for CABG.    Results for orders placed or performed during the hospital encounter of 04/05/24 (from the past 72 hours)  Glucose, capillary     Status: Abnormal   Collection Time: 04/08/24 11:47 AM  Result Value Ref Range   Glucose-Capillary 149 (H) 70 - 99 mg/dL    Comment: Glucose reference range applies  only to samples taken after fasting for at least 8 hours.  Glucose, capillary     Status: Abnormal   Collection Time: 04/08/24  4:06 PM  Result Value Ref Range   Glucose-Capillary 124 (H) 70 - 99 mg/dL    Comment: Glucose reference range applies only to samples taken after fasting for at least 8 hours.  Glucose, capillary     Status: Abnormal   Collection Time: 04/08/24  9:13 PM  Result Value Ref Range   Glucose-Capillary 111 (H) 70 - 99 mg/dL    Comment: Glucose reference range applies only to samples taken after fasting for at least 8 hours.  Basic metabolic panel     Status: Abnormal   Collection Time: 04/09/24  4:47 AM  Result Value Ref Range   Sodium 130 (L) 135 - 145 mmol/L   Potassium 3.7 3.5 - 5.1 mmol/L   Chloride 92 (L) 98 - 111 mmol/L   CO2 32 22 - 32 mmol/L   Glucose, Bld 96 70 - 99 mg/dL    Comment: Glucose reference range applies only to samples taken after fasting for at least 8 hours.   BUN 22 8 - 23 mg/dL   Creatinine, Ser 9.22  0.44 - 1.00 mg/dL   Calcium  8.3 (L) 8.9 - 10.3 mg/dL   GFR, Estimated >39 >39 mL/min    Comment: (NOTE) Calculated using the CKD-EPI Creatinine Equation (2021)    Anion gap 6 5 - 15    Comment: Performed at [facility], [address], [place], [place] [address]  CBC     Status: Abnormal   Collection Time: 04/09/24  4:47 AM  Result Value Ref Range   WBC 10.1 4.0 - 10.5 K/uL   RBC 2.50 (L) 3.87 - 5.11 MIL/uL   Hemoglobin 7.4 (L) 12.0 - 15.0 g/dL   HCT 78.5 (L) 63.9 - 53.9 %   MCV 85.6 80.0 - 100.0 fL   MCH 29.6 26.0 - 34.0 pg   MCHC 34.6 30.0 - 36.0 g/dL   RDW 85.3 88.4 - 84.4 %   Platelets 159 150 - 400 K/uL   nRBC 0.0 0.0 - 0.2 %    Comment: Performed at [facility], [address] [place], [place] [address]  Magnesium      Status: None   Collection Time: 04/09/24  4:47 AM  Result Value Ref Range   Magnesium  2.0 1.7 - 2.4 mg/dL    Comment: Performed at [facility], [address] [place], [place] [address]  Glucose, capillary     Status: Abnormal   Collection Time: 04/09/24  6:33 AM  Result Value Ref Range   Glucose-Capillary 100 (H) 70 - 99 mg/dL    Comment: Glucose reference range applies only to samples taken after fasting for at least 8 hours.  Glucose, capillary     Status: None   Collection Time: 04/10/24 12:08 PM  Result Value Ref Range   Glucose-Capillary 84 70 - 99 mg/dL    Comment: Glucose reference range applies only to samples taken after fasting for at least 8 hours.      Treatments: surgery:  Date of Surgery: 04/06/24    Preop Diagnosis:  Multivessel coronary artery disease Postop Diagnosis: Same Surgeon:  Con GORMAN Clunes, MD Assistant(s):  Lemond Cera PA-C (performed right [facility])   Experienced assistance was necessary for this case due to surgical complexity. Danija Gosa assisted by independently harvesting the saphenous vein endoscopically and closing the leg incision.  He then assisted with retraction of delicate tissues, exposure,  suctioning, and suture management during the anastomosis.   Anesthesia: GET   Procedures: 1. CABG x 3 (SVG - PL, SVG - D1, LIMA - LAD) 2. Endoscopic Vein Harvest  3. Occlusion of left atrial appendage (35 mm Flex Mini Atriclip) 4. Pulmonary vein isolation with Encompass clamp   Cross clamp time: 80 min Bypass time: 146 min  Discharge Exam: Blood pressure (!) 131/56, pulse (!) 103, temperature 98.3 F (36.8 C), temperature source Oral, resp. rate 18, height 5' 10 (1.778 m), weight 74.4 kg, SpO2 95%.   General appearance: alert, cooperative, and no distress Neurologic: intact Heart: regular rate and rhythm Lungs: clear to auscultation bilaterally Abdomen: benign Wound: incis healing well, mod ecchymosis right thigh  Discharge Medications:  The patient has been discharged on:   1.Beta Blocker:  Yes [ x  ]                              No   [   ]                              If No, reason:  2.Ace Inhibitor/ARB: Yes [   ]                                     No  [  n  ]                                     If No, reason: Labile blood pressure  3.Statin:   Yes [ x  ]                  No  [   ]                  If No, reason:  4.Ecasa:  Yes  [ x  ]                  No   [   ]                  If No, reason:  Patient had ACS upon admission:  Plavix/P2Y12 inhibitor: Yes [   ]                                      No  [  x ]     Discharge Instructions     Amb Referral to Cardiac Rehabilitation   Complete by: As directed    Diagnosis: CABG   CABG X ___: 3   After initial evaluation and assessments completed: Virtual Based Care may be provided alone or in conjunction with Phase 2 Cardiac Rehab based on patient barriers.: Yes   Intensive Cardiac Rehabilitation (ICR) [facility] location only OR Traditional Cardiac Rehabilitation (TCR) *If criteria for ICR are not met will enroll in TCR ([facility] only): Yes      Allergies as of 04/11/2024       Reactions   Sulfa Antibiotics  Nausea And Vomiting, Other (See Comments)   hypokalemia SEVERE hypokalemia   Codeine Nausea  And Vomiting   Morphine  Nausea And Vomiting   Amiodarone  Nausea And Vomiting   Meperidine Hives   Tramadol Hcl Itching        Medication List     STOP taking these medications    acetaminophen  650 MG CR tablet Commonly known as: TYLENOL    ALPRAZolam  0.5 MG tablet Commonly known as: XANAX    amLODipine  2.5 MG tablet Commonly known as: NORVASC    isosorbide  mononitrate 30 MG 24 hr tablet Commonly known as: IMDUR    metoprolol  succinate 50 MG 24 hr tablet Commonly known as: TOPROL -XL   nitroGLYCERIN  0.4 MG SL tablet Commonly known as: NITROSTAT        TAKE these medications    apixaban  5 MG Tabs tablet Commonly known as: ELIQUIS  Take 1 tablet (5 mg total) by mouth 2 (two) times daily.   aspirin  EC 81 MG tablet Take 81 mg by mouth daily. Swallow whole.   Biotin 5000 MCG Caps Take 5,000 mcg by mouth daily.   CALCIUM  600+D3 PO Take 1 tablet by mouth in the morning.   cyclobenzaprine  5 MG tablet Commonly known as: FLEXERIL  Take 1 tablet (5 mg total) by mouth 3 (three) times daily as needed for muscle spasms. What changed: when to take this   diclofenac Sodium 1 % Gel Commonly known as: VOLTAREN Apply 1 Application topically daily as needed (shoulder pain).   dronedarone  400 MG tablet Commonly known as: MULTAQ  Take 1 tablet (400 mg total) by mouth 2 (two) times daily with a meal.   famotidine  20 MG tablet Commonly known as: PEPCID  Take 20 mg by mouth daily as needed for heartburn or indigestion.   ferrous sulfate  325 (65 FE) MG tablet Take 1 tablet (325 mg total) by mouth 2 (two) times daily with a meal.   fexofenadine 180 MG tablet Commonly known as: ALLEGRA Take 180 mg by mouth daily as needed for allergies or rhinitis.   furosemide  20 MG tablet Commonly known as: LASIX  Take 1 tablet (20 mg total) by mouth daily.   GREEN TEA PO Take 2 tablets by mouth  daily. Fat burner   metoprolol  tartrate 25 MG tablet Commonly known as: LOPRESSOR  Take 0.5 tablets (12.5 mg total) by mouth 2 (two) times daily.   oxyCODONE  5 MG immediate release tablet Commonly known as: Oxy IR/ROXICODONE  Take 1 tablet (5 mg total) by mouth every 6 (six) hours as needed for severe pain (pain score 7-10).   oxymetazoline  0.05 % nasal spray Commonly known as: AFRIN Place 1-2 sprays into both nostrils daily as needed for congestion.   potassium chloride  10 MEQ tablet Commonly known as: KLOR-CON  M Take 1 tablet (10 mEq total) by mouth daily.   rosuvastatin  20 MG tablet Commonly known as: CRESTOR  Take 1 tablet (20 mg total) by mouth daily.        Follow-up Information     Rutha Manuelita HERO, PA-C. Go on 04/17/2024.   Specialties: Physician Assistant, Thoracic Surgery Why: Appointment time is at 4:00 pm. Please arrive by 3:00 pm in order to have PA/LAT CXR take PRIOR to office appointment. CXR located in the same building on the SECOND floor. Contact information: [address] [place] [address] [phone]         Ann Diaz LABOR, MD. Schedule an appointment as soon as possible for a visit.   Specialty: Family Medicine Why: for 2-3 weeks after discharge Contact information: [address] [place] [address] [phone]  Revankar, Jennifer SAUNDERS, MD. Go on 04/20/2024.   Specialty: Cardiology Why: Appointment time is at 4:00 pm Contact information: [address] [place] [address] [phone]         [facility]) Follow up.   Specialty: Home Health Services Why: Home Health Physical and Occupational THerapy-office to call with visit times. Contact information: [address] [place]  [address] [phone]                Signed:  Babe Anthis E Anyia Gierke, PA-C  04/11/2024, 9:58 AM  "

## 2024-04-06 NOTE — Progress Notes (Signed)
 "  NAME:  Ann Diaz, MRN:  [id number], DOB:  01-01-1946, LOS: 1 ADMISSION DATE:  04/05/2024, CONSULTATION DATE:  1/21 REFERRING MD:  SU, CHIEF COMPLAINT:  post CABG critical care    History of Present Illness:  79 year old female w/ prior h/o HTN, GERD and mild MR. Hospitalized Nov 2025 w/ new onset AF.  As part of evaluation underwent Coronary CT  12/17 and found to have svr CAD and subsequently underwent diagnostic left heart cath on 12/18. : Left main 50%, LAD 75%, D1 70%, Distal Cx 80%, Prox RCA 80%; TTE (11/20/23): Normal BiV function, mild-mod mitral regurgitation w/ hemodynamically significant prox LAD stenosis. Because she also had class  III angina was referred to cardiothoracic surgery. She was evaluated on 1/15, DOAC for her AF placed on hold and she presented for CABG w/ LA appendage clipping   Note multiple allergies:  Sulfa Antibiotics Codeine Morphine  Amiodarone  Meperidine Tramadol Hcl   OR events Bypass time 2h 26 m Clamp time 1hr 20 m EBL 885 Blood products/cell saver: 482 cell saver Fluids 2500 crystalloid/250 ml albumin    -tissue friable and oozy  -intra op nml bivent fxn w/ no valve issues  Pertinent  Medical History  PAF, HTN, GERD, mild MR.   Significant Hospital Events: Including procedures, antibiotic start and stop dates in addition to other pertinent events   1/21 3V CABG w left atrial appendage clipping   Interim History / Subjective:  3L [place] satting well. Net positive 3.5 L with 570 in chest tube drain, 1200 UOP. SBP 100-120. Off norepi. Glycemic control is good on insulin  infusion.  Objective    Blood pressure 116/64, pulse 78, temperature 97.9 F (36.6 C), temperature source Oral, resp. rate (!) 24, height 5' 10 (1.778 m), weight 80.9 kg, SpO2 97%. CVP:  [0 mmHg-24 mmHg] 1 mmHg CO:  [4 L/min-5.2 L/min] 5.2 L/min CI:  [2.1 L/min/m2-2.7 L/min/m2] 2.7 L/min/m2  Vent Mode: PSV;CPAP FiO2 (%):  [40 %-50 %] 40 % Set Rate:  [4 bmp-16 bmp] 14  bmp Vt Set:  [550 mL] 550 mL PEEP:  [5 cmH20] 5 cmH20 Pressure Support:  [10 cmH20] 10 cmH20   Intake/Output Summary (Last 24 hours) at 04/06/2024 1138 Last data filed at 04/06/2024 1000 Gross per 24 hour  Intake 4754.1 ml  Output 3130 ml  Net 1624.1 ml   Filed Weights   04/05/24 0644 04/06/24 0600  Weight: 73.8 kg 80.9 kg    Examination: General: 79 year old female. Awake, alert, on simple [place]. No distress. HENT: NCAT. Internal jugular right sided CVL.  Lungs: No increase work of breathing. Cardiovascular: RRR mid sternal dressing CD&I. Pacing wires. Chest tubes.  Abdomen: soft  Extremities: warm no edema right SVG site dressing w/ ace and CD&I Neuro: moving all ext no focal def GU: Foley  Resolved problem list   Assessment and Plan   Multi-vessel CAD now s/p CABG X 3 w/ left atrial appendage clipping w/ right SVG plan Continue post operative telemetry monitoring for conduction disturbances Ensure temporary epicardial pacing wires in place with back up rate for symptomatic bradycardia or AVB MAP goal 65-80 - d/c Arterial line today Tight glycemic control - transition to ISS today. Multi-modal analgesia  Con't SCDs; w/ DVT prophylaxis vs systemic AC to be initiated at surgical team discretion -- decision made to hold off on ppx initiation as of 1/22 Complete prophylactic antibiotics    H/o afib Plan Tele  EP recommending that we resume patient's home  dronedarone  (Multaq ) which has been done. She has a listed adverse reaction to amiodarone  though relatively low grade it would seem (see listed Allergies). Holding [facility] w/ time to resume per Cardiac surg   Expected acute blood loss anemia  Baseline hgb: 12.1 Plan Trend CBC Transfuse for active bleeding or hgb < 9    Labs   CBC: Recent Labs  Lab 04/04/24 0900 04/05/24 0906 04/05/24 1238 04/05/24 1244 04/05/24 1540 04/05/24 1825 04/05/24 2010 04/05/24 2029 04/06/24 0345  WBC 6.5  --   --   --  16.8*  --   --   11.6* 7.3  HGB 12.1   < > 7.7*   < > 8.2*  9.4* 7.1* 5.4* 8.1* 7.5*  HCT 36.4   < > 22.9*   < > 24.0*  27.4* 21.0* 16.0* 24.4* 23.0*  MCV 87.3  --   --   --  85.9  --   --  88.4 87.8  PLT 232  --  192  --  171  --   --  126* 107*   < > = values in this interval not displayed.    Basic Metabolic Panel: Recent Labs  Lab 04/04/24 0900 04/05/24 0906 04/05/24 1141 04/05/24 1214 04/05/24 1244 04/05/24 1314 04/05/24 1351 04/05/24 1355 04/05/24 1540 04/05/24 1825 04/05/24 2010 04/05/24 2029 04/06/24 0345  NA 137   < > 138   < > 137   < > 139   < > 139 140 142 138 136  K 5.0   < > 4.9   < > 5.3*   < > 4.7   < > 4.6 4.4 3.6 4.5 4.5  CL 103   < > 103  --  103  --  105  --   --   --   --  105 104  CO2 24  --   --   --   --   --   --   --   --   --   --  23 23  GLUCOSE 90   < > 141*  --  138*  --  125*  --   --   --   --  146* 139*  BUN 19   < > 18  --  17  --  17  --   --   --   --  15 15  CREATININE 0.88   < > 0.80  --  0.70  --  0.70  --   --   --   --  0.71 0.67  CALCIUM  9.5  --   --   --   --   --   --   --   --   --   --  7.9* 7.9*  MG  --   --   --   --   --   --   --   --   --   --   --  3.2* 2.6*   < > = values in this interval not displayed.   GFR: Estimated Creatinine Clearance: 64.7 mL/min (by C-G formula based on SCr of 0.67 mg/dL). Recent Labs  Lab 04/04/24 0900 04/05/24 1540 04/05/24 2029 04/06/24 0345  WBC 6.5 16.8* 11.6* 7.3    Liver Function Tests: Recent Labs  Lab 04/04/24 0900  AST 24  ALT 17  ALKPHOS 63  BILITOT 0.5  PROT 6.9  ALBUMIN  4.2   No results for input(s): LIPASE, AMYLASE in the last 168  hours. No results for input(s): AMMONIA in the last 168 hours.  ABG    Component Value Date/Time   PHART 7.340 (L) 04/05/2024 2010   PCO2ART 29.9 (L) 04/05/2024 2010   PO2ART 114 (H) 04/05/2024 2010   HCO3 16.2 (L) 04/05/2024 2010   TCO2 17 (L) 04/05/2024 2010   ACIDBASEDEF 9.0 (H) 04/05/2024 2010   O2SAT 98 04/05/2024 2010      Coagulation Profile: Recent Labs  Lab 04/04/24 0900 04/05/24 1540  INR 1.0 1.4*    Cardiac Enzymes: No results for input(s): CKTOTAL, CKMB, CKMBINDEX, TROPONINI in the last 168 hours.  HbA1C: Hgb A1c MFr Bld  Date/Time Value Ref Range Status  04/04/2024 09:00 AM 5.3 4.8 - 5.6 % Final    Comment:    (NOTE) Diagnosis of Diabetes The following HbA1c ranges recommended by the American Diabetes Association (ADA) may be used as an aid in the diagnosis of diabetes mellitus.  Hemoglobin             Suggested A1C NGSP%              Diagnosis  <5.7                   Non Diabetic  5.7-6.4                Pre-Diabetic  >6.4                   Diabetic  <7.0                   Glycemic control for                       adults with diabetes.      CBG: Recent Labs  Lab 04/06/24 0008 04/06/24 0111 04/06/24 0310 04/06/24 0507 04/06/24 0827  GLUCAP 148* 141* 144* 129* 136*    Review of Systems:   Not able   Past Medical History:  She,  has a past medical history of Anxiety, Arthritis, Coronary artery disease, Dysrhythmia, Essential hypertension (10/28/2023), GERD (gastroesophageal reflux disease), Hypertension, Lumbar pseudoarthrosis (07/31/2021), Moderate mitral regurgitation (01/11/2024), New onset atrial fibrillation (HCC) (10/28/2023), PONV (postoperative nausea and vomiting), and Spondylolisthesis, lumbar region (11/25/2020).   Surgical History:   Past Surgical History:  Procedure Laterality Date   BACK SURGERY     11/25/2020   CHOLECYSTECTOMY     EYE SURGERY     FRACTURE SURGERY Left    left wrist   LEFT HEART CATH AND CORONARY ANGIOGRAPHY N/A 03/14/2024   Procedure: LEFT HEART CATH AND CORONARY ANGIOGRAPHY;  Surgeon: Ladona Heinz, MD;  Location: [facility] INVASIVE CV [facility];  Service: Cardiovascular;  Laterality: N/A;   TUBAL LIGATION       Social History:   reports that she has never smoked. She has never used smokeless tobacco. She reports that she does not  drink alcohol and does not use drugs.   Family History:  Her family history includes Diabetes in her brother; Heart attack in her brother; Hypertension in her brother, father, and mother. There is no history of Cancer.   Allergies Allergies[1]   Home Medications  Prior to Admission medications  Medication Sig Start Date End Date Taking? Authorizing Provider  acetaminophen  (TYLENOL ) 650 MG CR tablet Take 1,300 mg by mouth at bedtime.   Yes [provider]  ALPRAZolam  (XANAX ) 0.5 MG tablet Take 0.5 mg by mouth 2 (two) times daily as needed for anxiety.  07/09/20  Yes [provider]  amLODipine  (NORVASC ) 2.5 MG tablet Take 1 tablet (2.5 mg total) by mouth daily. 03/14/24 03/14/25 Yes Ladona Heinz, MD  apixaban  (ELIQUIS ) 5 MG TABS tablet Take 1 tablet (5 mg total) by mouth 2 (two) times daily. 03/15/24  Yes Ladona Heinz, MD  aspirin  EC 81 MG tablet Take 81 mg by mouth daily. Swallow whole.   Yes [provider]  Biotin 5000 MCG CAPS Take 5,000 mcg by mouth daily.   Yes [provider]  Calcium  Carb-Cholecalciferol (CALCIUM  600+D3 PO) Take 1 tablet by mouth in the morning.   Yes [provider]  diclofenac Sodium (VOLTAREN) 1 % GEL Apply 1 Application topically daily as needed (shoulder pain).   Yes [provider]  dronedarone  (MULTAQ ) 400 MG tablet Take 1 tablet (400 mg total) by mouth 2 (two) times daily with a meal. 03/02/24  Yes Revankar, Jennifer SAUNDERS, MD  famotidine  (PEPCID ) 20 MG tablet Take 20 mg by mouth daily as needed for heartburn or indigestion.   Yes [provider]  fexofenadine (ALLEGRA) 180 MG tablet Take 180 mg by mouth daily as needed for allergies or rhinitis.   Yes [provider]  Landy Tea, Camellia sinensis, (GREEN TEA PO) Take 2 tablets by mouth daily. Fat burner   Yes [provider]  isosorbide  mononitrate (IMDUR ) 30 MG 24 hr tablet Take 1 tablet (30 mg total) by mouth daily. 03/14/24 03/14/25 Yes  Ladona Heinz, MD  metoprolol  succinate (TOPROL -XL) 50 MG 24 hr tablet Take 1 tablet (50 mg total) by mouth daily. Take with or immediately following a meal. 10/28/23 04/05/24 Yes Revankar, Jennifer SAUNDERS, MD  nitroGLYCERIN  (NITROSTAT ) 0.4 MG SL tablet Place 1 tablet (0.4 mg total) under the tongue every 5 (five) minutes as needed for chest pain. 03/14/24 04/08/24 Yes Ladona Heinz, MD  oxymetazoline  (AFRIN) 0.05 % nasal spray Place 1-2 sprays into both nostrils daily as needed for congestion.   Yes [provider]  rosuvastatin  (CRESTOR ) 20 MG tablet Take 1 tablet (20 mg total) by mouth daily. 03/02/24 05/31/24 Yes Revankar, Jennifer SAUNDERS, MD  cyclobenzaprine  (FLEXERIL ) 5 MG tablet Take 1 tablet (5 mg total) by mouth 3 (three) times daily as needed for muscle spasms. Patient taking differently: Take 5 mg by mouth at bedtime as needed for muscle spasms. 08/01/21   Mavis Purchase, MD     Critical care time   Total critical care time spent by me: 32 minutes   Critical care time was exclusive of separately billable procedures and the treatment of any other patient.   Critical care was necessary to treat or prevent imminent or life-threatening deterioration.  Critical care was time spent personally by me on the following activities: development of treatment plan with patient and/or surrogate as well as nursing, discussions with consultants, re-evaluation of the patient's condition and their response to treatment, examination of patient, obtaining history from patient or surrogate, ordering and performing treatments and interventions, ordering and review of laboratory studies, ordering and review of radiographic studies, and participation in multidisciplinary rounds.  Lamar Dales, MD Pulmonary, Critical Care & Sleep Medicine [facility]  7a-7p: For contact information, see AMION. If no response to pager, please call PCCM 2-H APP. After 7p: Please call PCCM APP on-call for 2-H.           [1]  Allergies Allergen Reactions   Sulfa Antibiotics Nausea And Vomiting and Other (See Comments)    hypokalemia SEVERE hypokalemia  Codeine Nausea And Vomiting   Morphine  Nausea And Vomiting   Amiodarone  Nausea And Vomiting   Meperidine Hives   Tramadol Hcl Itching   "

## 2024-04-06 NOTE — Progress Notes (Signed)
 Physical Therapy Treatment Patient Details Name: Ann Diaz MRN: [id number] DOB: [birth year] Today's Date: 04/06/2024   History of Present Illness 79 year old woman presented to her Cardiologist with atrial fibrillation, prompting a CT coronary scan which demonstrated significant calcium . 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 PMH: paroxysmal atrial fibrillation hypertension GERD and mild mitral regurgitation.    PT Comments  PTA pt living with husband in split level house with 1 step to enter and 6 steps to bed and bath. Pt was completely independent with mobility and iADLs, driving and going to the gym 2x/wk. Pt is currently limited in safe mobility by increased chest pain in presence of chest tube and sternotomy, limited use of BUE in presence of sternal precautions, fatigue and generalized weakness. Pt is currently min Ax2 for STS and minAx2 for ambulation of 60 ft with close chair follow. Patient will benefit from intensive inpatient follow-up therapy, >3 hours/day. PT will continue to follow acutely.     If plan is discharge home, recommend the following: A little help with walking and/or transfers;A little help with bathing/dressing/bathroom;Assistance with cooking/housework;Assist for transportation;Help with stairs or ramp for entrance   Can travel by private vehicle      No   Equipment Recommendations  None recommended by PT    Recommendations for Other Services Rehab consult     Precautions / Restrictions Precautions Precautions: Sternal Precaution/Restrictions Comments: educated to move in the tube, no pushing, pulling lifting, chest tube and portable wound vac. Restrictions Weight Bearing Restrictions Per Provider Order: Yes (sternal precautions)     Mobility  Bed Mobility               General bed mobility comments: up in recliner on entry    Transfers Overall transfer level: Needs  assistance Equipment used: 2 person hand held assist Transfers: Sit to/from Stand Sit to Stand: Min assist, +2 physical assistance           General transfer comment: min Ax2 for pad scoot to EoB and power up to EVA walker while holding heart pillow    Ambulation/Gait Ambulation/Gait assistance: +2 safety/equipment, Min assist Gait Distance (Feet): 60 Feet Assistive device: Elyn Finder Gait Pattern/deviations: Step-through pattern, Decreased step length - left, Antalgic, Trunk flexed Gait velocity: slowed Gait velocity interpretation: <1.31 ft/sec, indicative of household ambulator   General Gait Details: increased limp due to leg length descrepency, pt reports it is her baseline, vc for upright positioning, after ~50 feet increasing trunk flexion, pt unable to correct due to fatigue, had pt sit in recliner to wheel back to room         Balance Overall balance assessment: Needs assistance Sitting-balance support: Feet supported, No upper extremity supported Sitting balance-Leahy Scale: Good     Standing balance support: During functional activity, Reliant on assistive device for balance, Bilateral upper extremity supported Standing balance-Leahy Scale: Poor Standing balance comment: requires UE support for dynamic activity                            Communication Communication Communication: No apparent difficulties  Cognition Arousal: Alert Behavior During Therapy: Flat affect, WFL for tasks assessed/performed   PT - Cognitive impairments: No apparent impairments                         Following commands: Intact      Cueing Cueing Techniques:  Verbal cues, Gestural cues, Tactile cues     General Comments General comments (skin integrity, edema, etc.): VSS on 2L O2 via [place], increased LE edema      Pertinent Vitals/Pain Pain Assessment Pain Assessment: Faces Faces Pain Scale: Hurts whole lot Pain Location: chest tube insertion Pain  Descriptors / Indicators: Discomfort, Grimacing, Guarding, Throbbing, Tightness Pain Intervention(s): Limited activity within patient's tolerance, Premedicated before session, Repositioned    Home Living Family/patient expects to be discharged to:: Private residence Living Arrangements: Spouse/significant other Available Help at Discharge: Family;Available 24 hours/day Type of Home: House Home Access: Stairs to enter Entrance Stairs-Rails: None Entrance Stairs-Number of Steps: 1 Alternate Level Stairs-Number of Steps: 6 Home Layout: Two level Home Equipment: Agricultural Consultant (2 wheels);Shower seat - built in Additional Comments: huband available 24.7    Prior Function            PT Goals (current goals can now be found in the care plan section) Acute Rehab PT Goals PT Goal Formulation: With patient/family Time For Goal Achievement: 04/20/24 Potential to Achieve Goals: Fair    Frequency    Min 4X/week       AM-PAC PT 6 Clicks Mobility   Outcome Measure  Help needed turning from your back to your side while in a flat bed without using bedrails?: A Little Help needed moving from lying on your back to sitting on the side of a flat bed without using bedrails?: A Lot Help needed moving to and from a bed to a chair (including a wheelchair)?: A Lot Help needed standing up from a chair using your arms (e.g., wheelchair or bedside chair)?: A Lot Help needed to walk in hospital room?: A Lot Help needed climbing 3-5 steps with a railing? : Total 6 Click Score: 12    End of Session Equipment Utilized During Treatment: Gait belt;Oxygen Activity Tolerance: Patient limited by pain Patient left: in chair;with call bell/phone within reach;with nursing/sitter in room;with family/visitor present Nurse Communication: Mobility status PT Visit Diagnosis: Unsteadiness on feet (R26.81);Other abnormalities of gait and mobility (R26.89);Muscle weakness (generalized) (M62.81);Difficulty in  walking, not elsewhere classified (R26.2);Pain Pain - part of body:  (chest)     Time: 1029-1110 PT Time Calculation (min) (ACUTE ONLY): 41 min  Charges:    $Gait Training: 8-22 mins $Therapeutic Activity: 8-22 mins PT General Charges $$ ACUTE PT VISIT: 1 Visit                     Ann Diaz B. Fleeta Lapidus PT, DPT Acute Rehabilitation Services Please use secure chat or  Call Office [phone]    Ann Diaz 04/06/2024, 11:50 AM

## 2024-04-06 NOTE — Op Note (Signed)
 Date of Surgery: 04/06/24   Preop Diagnosis:  Multivessel coronary artery disease Postop Diagnosis: Same Surgeon:  Con GORMAN Clunes, MD Assistant(s):  Lemond Cera PA-C (performed right [facility])  Experienced assistance was necessary for this case due to surgical complexity. Wayne assisted by independently harvesting the saphenous vein endoscopically and closing the leg incision. He then assisted with retraction of delicate tissues, exposure, suctioning, and suture management during the anastomosis.  Anesthesia: GET  Procedures: 1. CABG x 3 (SVG - PL, SVG - D1, LIMA - LAD) 2. Endoscopic Vein Harvest  3. Occlusion of left atrial appendage (35 mm Flex Mini Atriclip) 4. Pulmonary vein isolation with Encompass clamp  Cross clamp time: 80 min Bypass time: 146 min  Approach:  Median sternotomy Drains: 1 left pleural 28 fr, 2 mediastinal 28 Fr straight Pacing Wires: atrial and ventricular Findings of Procedure: 1. Vein conduit very ectatic and dilated but opposite leg not of better quality 2. PL target quality: Small, fair 3. Diagonal target quality: Small, fair 4. LAD target quality: Medium sized, good 5.  PDA was small, both OM1 and OM2 were too small to bypass, especially given the limited amount and quality of vein conduit.  Estimated Blood Loss: 885 mL Blood Products Administered: 1U PRBC Disposition:  ICU Specimens:  none Indication: Ms. Hinde is a 79 year old woman with history of paroxysmal atrial fibrillation hypertension GERD and mitral regurgitation.  She has been having shortness of breath which is improved with management of her atrial fibrillation.  She denies chest pain but does have left shoulder pain which is thought to be from a pinched nerve.  She is fully functional and independent in her ADLs.   Given her history of paroxysmal A-fib we will plan to perform pulmonary vein isolation with the encompass clamp at the time of her CABG.  We will also clip her left atrial  appendage.   Procedure in Detail:  The risks, benefits, complications, treatment options, and expected outcomes were discussed with the patient. The possibilities of reaction to medication, pulmonary aspiration, perforation of viscus, bleeding, recurrent infection, the need for additional procedures, failure to diagnose a condition, and creating a complication requiring transfusion or operation were discussed with the patient. The patient concurred with the proposed plan, giving informed consent. The patient was taken to the operating room, identified as Ann Diaz and the procedure verified as Coronary Artery Bypass Grafting. A Time Out was held and the above information confirmed.  The patient was prepped and draped in a sterile fashion. An endoscopic vein harvesting in the right leg was carried out by Wayne Gold PA-C. A standard median sternotomy was performed. The left internal mammary artery was taken down using cautery and clips and left pleural chest tube was placed.  Intervenous heparin  was given at the appropriate dose to obtain anticoagulation sufficient for CPB. The LIMA was then transected distally. The flow was excellent.   The pericardium was then opened and the pericardial well was created. The aorta was palpated and free of calcium /plaque.  Concentric purse strings were placed and the aorta was cannulated with a 20 fr EOPA cannula. After adequate de-airing this cannula was connected to the CPB circuit.  A purse string was placed around the right atrial appendage and it was cannulated with a large triple stage venous cannula. Prior to initiating CPB the LIMA was fashioned to the appropriate length.   Cardiopulmonary bypass was then initiated. The oblique and transverse sinuses were developed and the Encompass clamp was  carefully passed through the sinuses and around the posterior left atrium.  Two sets of 90 second ablation were then performed without adverse events.  The left atrial  appendage was then measured and occluded with a 35 mm Flex Mini Atriclip.  There were no ST changes.  The distal targets (PL, D1, LAD)  were then evaluated and marked. The heart basket was then placed into the appropriate position. An antegrade vent/cardioplegia cannula was then placed. The aortic cross clamp was then applied and the heart was arrested with antegrade cardioplegia. Cardioplegia was then administered intermittantly to ensure adequate myocardial protection.   The distal anastomosis to the PL was completed first. After positioning the heart, the artery was opened and RSV was anastomosed to the artery using 7-0 prolene in a running fashion. The flow was tested and was adequate.   Next, the distal anastomosis to  to the D1 was completed. After positioning the heart, the artery was opened and RSV was anastomosed to the artery using 7-0 prolene in a running fashion. The flow was tested and was adequate.    Next the LIMA to LAD anastomosis was completed in a similar fashion with 7-0 prolene.    The proximal anastomoses were then completed in the standard fashion using 6-0 prolene in a running fashion. After adequate deairing maneuvers, the cross clamp was removed and the heart was reperfused.   Examination of all surgical sites revealed good hemostasis. Atrial and ventricular epicardial pacing wires were then placed. The patient was fully ventillated and successfully weaned off of CPB. After a test dose of protamine , the patient was fully decannulated.  The heparin  was fully reversed with protamine . Again, examination of all surgical sites were evaluated for hemostasis which was good.  Post-bypass TEE revealed preserved RV/LV function and no new wall motion abnormalities.    Mediastinal drains were placed. The sternum was closed using stainless steel wires. The fascia was closed with #1 vicryl and the deep dermal with 2-0 Vicryl. The skin was closed with 3-0 Vicryl. A sterile dressing was  applied over the incision.  At the end of the operation, all sponge, instruments, and needle counts were correct.   The patient tolerated the procedure without complication and was transported to the CTICU in stable condition.  Complications: none  Specimen: None  Implants: 35 mm Atriclip

## 2024-04-07 ENCOUNTER — Inpatient Hospital Stay (HOSPITAL_COMMUNITY)

## 2024-04-07 DIAGNOSIS — E871 Hypo-osmolality and hyponatremia: Secondary | ICD-10-CM

## 2024-04-07 DIAGNOSIS — Z951 Presence of aortocoronary bypass graft: Secondary | ICD-10-CM | POA: Diagnosis not present

## 2024-04-07 DIAGNOSIS — R739 Hyperglycemia, unspecified: Secondary | ICD-10-CM

## 2024-04-07 DIAGNOSIS — K5903 Drug induced constipation: Secondary | ICD-10-CM | POA: Diagnosis not present

## 2024-04-07 DIAGNOSIS — K219 Gastro-esophageal reflux disease without esophagitis: Secondary | ICD-10-CM | POA: Diagnosis not present

## 2024-04-07 LAB — CBC
HCT: 22.3 % — ABNORMAL LOW (ref 36.0–46.0)
Hemoglobin: 7.3 g/dL — ABNORMAL LOW (ref 12.0–15.0)
MCH: 29.4 pg (ref 26.0–34.0)
MCHC: 32.7 g/dL (ref 30.0–36.0)
MCV: 89.9 fL (ref 80.0–100.0)
Platelets: 142 K/uL — ABNORMAL LOW (ref 150–400)
RBC: 2.48 MIL/uL — ABNORMAL LOW (ref 3.87–5.11)
RDW: 15.2 % (ref 11.5–15.5)
WBC: 13.5 K/uL — ABNORMAL HIGH (ref 4.0–10.5)
nRBC: 0 % (ref 0.0–0.2)

## 2024-04-07 LAB — GLUCOSE, CAPILLARY
Glucose-Capillary: 100 mg/dL — ABNORMAL HIGH (ref 70–99)
Glucose-Capillary: 124 mg/dL — ABNORMAL HIGH (ref 70–99)
Glucose-Capillary: 135 mg/dL — ABNORMAL HIGH (ref 70–99)
Glucose-Capillary: 135 mg/dL — ABNORMAL HIGH (ref 70–99)
Glucose-Capillary: 141 mg/dL — ABNORMAL HIGH (ref 70–99)
Glucose-Capillary: 186 mg/dL — ABNORMAL HIGH (ref 70–99)

## 2024-04-07 LAB — BASIC METABOLIC PANEL WITH GFR
Anion gap: 7 (ref 5–15)
BUN: 19 mg/dL (ref 8–23)
CO2: 24 mmol/L (ref 22–32)
Calcium: 8.4 mg/dL — ABNORMAL LOW (ref 8.9–10.3)
Chloride: 98 mmol/L (ref 98–111)
Creatinine, Ser: 0.65 mg/dL (ref 0.44–1.00)
GFR, Estimated: >60 mL/min
Glucose, Bld: 116 mg/dL — ABNORMAL HIGH (ref 70–99)
Potassium: 4.7 mmol/L (ref 3.5–5.1)
Sodium: 129 mmol/L — ABNORMAL LOW (ref 135–145)

## 2024-04-07 MED ORDER — POLYETHYLENE GLYCOL 3350 17 G PO PACK
17.0000 g | PACK | Freq: Every day | ORAL | Status: DC
  Administered 2024-04-07 – 2024-04-10 (×3): 17 g via ORAL
  Filled 2024-04-07 (×3): qty 1

## 2024-04-07 MED ORDER — FUROSEMIDE 10 MG/ML IJ SOLN
40.0000 mg | Freq: Three times a day (TID) | INTRAMUSCULAR | Status: AC
  Administered 2024-04-07 (×3): 40 mg via INTRAVENOUS
  Filled 2024-04-07 (×3): qty 4

## 2024-04-07 MED ORDER — METOCLOPRAMIDE HCL 5 MG/ML IJ SOLN
10.0000 mg | Freq: Four times a day (QID) | INTRAMUSCULAR | Status: AC
  Administered 2024-04-07 – 2024-04-08 (×6): 10 mg via INTRAVENOUS
  Filled 2024-04-07 (×6): qty 2

## 2024-04-07 MED ORDER — FAMOTIDINE 20 MG PO TABS
20.0000 mg | ORAL_TABLET | Freq: Every day | ORAL | Status: DC
  Administered 2024-04-07 – 2024-04-11 (×5): 20 mg via ORAL
  Filled 2024-04-07 (×5): qty 1

## 2024-04-07 MED ORDER — HEPARIN SODIUM (PORCINE) 5000 UNIT/ML IJ SOLN
5000.0000 [IU] | Freq: Three times a day (TID) | INTRAMUSCULAR | Status: DC
  Administered 2024-04-07 – 2024-04-10 (×8): 5000 [IU] via SUBCUTANEOUS
  Filled 2024-04-07 (×9): qty 1

## 2024-04-07 MED ORDER — FERROUS SULFATE 325 (65 FE) MG PO TABS
325.0000 mg | ORAL_TABLET | Freq: Two times a day (BID) | ORAL | Status: DC
  Administered 2024-04-07 – 2024-04-11 (×9): 325 mg via ORAL
  Filled 2024-04-07 (×9): qty 1

## 2024-04-07 MED FILL — Albumin, Human Inj 5%: INTRAVENOUS | Qty: 250 | Status: AC

## 2024-04-07 MED FILL — Electrolyte-R (PH 7.4) Solution: INTRAVENOUS | Qty: 4000 | Status: AC

## 2024-04-07 MED FILL — Heparin Sodium (Porcine) Inj 1000 Unit/ML: INTRAMUSCULAR | Qty: 20 | Status: AC

## 2024-04-07 MED FILL — Sodium Chloride IV Soln 0.9%: INTRAVENOUS | Qty: 2000 | Status: AC

## 2024-04-07 MED FILL — Sodium Bicarbonate IV Soln 8.4%: INTRAVENOUS | Qty: 50 | Status: AC

## 2024-04-07 NOTE — Progress Notes (Signed)
 2 Days Post-Op Procedures (LRB): CORONARY ARTERY BYPASS GRAFTING X 3, USING LEFT INTERNAL MAMMARY ARTERY AND RIGHT ENDOSCOPIC HARVESTED GREATER SAPHENOUS VEIN (N/A) CLIPPING, LEFT ATRIAL APPENDAGE USING ATRICLIP (N/A) ECHOCARDIOGRAM, TRANSESOPHAGEAL, INTRAOPERATIVE (N/A) ISOLATION, VEIN, PULMONARY, BILATERAL Subjective:  Still having some nausea. No BM yet.  Ambulated a little this am.  Objective: Vital signs in last 24 hours: Temp:  [97.8 F (36.6 C)-99 F (37.2 C)] 99 F (37.2 C) (01/23 0500) Pulse Rate:  [67-78] 71 (01/23 0600) Cardiac Rhythm: Normal sinus rhythm (01/22 2100) Resp:  [11-24] 14 (01/23 0600) BP: (109-133)/(51-87) 121/57 (01/23 0600) SpO2:  [91 %-99 %] 94 % (01/23 0600) Weight:  [81.5 kg] 81.5 kg (01/23 0500)  Hemodynamic parameters for last 24 hours: CVP:  [1 mmHg] 1 mmHg  Intake/Output from previous day: 01/22 0701 - 01/23 0700 In: 1731.6 [P.O.:1410; I.V.:221.6; IV Piggyback:100] Out: 1395 [Urine:575; Chest Tube:820] Intake/Output this shift: No intake/output data recorded.  General appearance: alert and cooperative Neurologic: intact Heart: regular rate and rhythm Lungs: diminished breath sounds bibasilar Abdomen: soft, non-tender; bowel sounds present Extremities: edema moderate Wound: Prevena in place  Lab Results: Recent Labs    04/06/24 1611 04/07/24 0530  WBC 9.6 13.5*  HGB 7.8* 7.3*  HCT 24.0* 22.3*  PLT 116* 142*   BMET:  Recent Labs    04/06/24 1611 04/07/24 0530  NA 134* 129*  K 4.8 4.7  CL 102 98  CO2 25 24  GLUCOSE 151* 116*  BUN 18 19  CREATININE 0.70 0.65  CALCIUM  8.5* 8.4*    PT/INR:  Recent Labs    04/05/24 1540  LABPROT 17.8*  INR 1.4*   ABG    Component Value Date/Time   PHART 7.340 (L) 04/05/2024 2010   HCO3 16.2 (L) 04/05/2024 2010   TCO2 17 (L) 04/05/2024 2010   ACIDBASEDEF 9.0 (H) 04/05/2024 2010   O2SAT 98 04/05/2024 2010   CBG (last 3)  Recent Labs    04/06/24 1948 04/07/24 0034  04/07/24 0409  GLUCAP 129* 135* 100*   Narrative & Impression  CLINICAL DATA:  Status post cardiac surgery.   EXAM: PORTABLE CHEST 1 VIEW   COMPARISON:  04/06/2024   FINDINGS: No definite left apical pneumothorax on the current study although extreme left apex is not included on the film. Right IJ central line remains in place. Mediastinal/pericardial and left thoracic drains again noted. The cardio pericardial silhouette is enlarged. Lucency along the right heart or has decreased in the interval. Persistent basilar atelectasis with right pleural effusion again noted. Gaseous distention of the stomach evident in the left upper quadrant.   IMPRESSION: 1. No definite left apical pneumothorax on the current study although extreme left apex is not included on the film. 2. Persistent basilar atelectasis with right pleural effusion. 3. Decreasing lucency along the right heart border.     Electronically Signed   By: Camellia Candle M.D.   On: 04/07/2024 07:40     Assessment/Plan: S/P Procedures (LRB): CORONARY ARTERY BYPASS GRAFTING X 3, USING LEFT INTERNAL MAMMARY ARTERY AND RIGHT ENDOSCOPIC HARVESTED GREATER SAPHENOUS VEIN (N/A) CLIPPING, LEFT ATRIAL APPENDAGE USING ATRICLIP (N/A) ECHOCARDIOGRAM, TRANSESOPHAGEAL, INTRAOPERATIVE (N/A) ISOLATION, VEIN, PULMONARY, BILATERAL  POD 2 Hemodynamically stable in NSR. Continue Lopressor .  +336 cc yesterday. 820 cc out from chest tubes. Thin serosanguinous. Wt is 17 lbs over preop. Start diuresis with lasix q8 today.  Na 129 probably due to drinking water  and volume excess. Liberalize Na intake and decrease water  intake.  Expected  acute blood loss anemia: BP is stable so will start iron and follow. Diuresis should help.  Keep all CT's today.  IS, ambulation.  Nausea: continue Reglan , Zofran /compazine  prn.   LOS: 2 days    Ann Diaz 04/07/2024

## 2024-04-07 NOTE — Plan of Care (Signed)

## 2024-04-07 NOTE — Progress Notes (Signed)
 TCTS Evening Rounds:  Hemodynamically stable but nurse has noticed some orthostasis when she is up, probably related to diuresis today. -2L so far.   She has been sitting up in chair and ambulated a short distance twice today.  Nausea better.  Still has a fair amount of thin chest tube drainage likely due to volume excess.

## 2024-04-07 NOTE — Progress Notes (Signed)
 "  NAME:  Ann Diaz, MRN:  [id number], DOB:  Jul 19, 1945, LOS: 2 ADMISSION DATE:  04/05/2024, CONSULTATION DATE:  1/21 REFERRING MD:  SU, CHIEF COMPLAINT:  post CABG critical care    History of Present Illness:  79 year old female w/ prior h/o HTN, GERD and mild MR. Hospitalized Nov 2025 w/ new onset AF.  As part of evaluation underwent Coronary CT  12/17 and found to have svr CAD and subsequently underwent diagnostic left heart cath on 12/18. : Left main 50%, LAD 75%, D1 70%, Distal Cx 80%, Prox RCA 80%; TTE (11/20/23): Normal BiV function, mild-mod mitral regurgitation w/ hemodynamically significant prox LAD stenosis. Because she also had class  III angina was referred to cardiothoracic surgery. She was evaluated on 1/15, DOAC for her AF placed on hold and she presented for CABG w/ LA appendage clipping   Note multiple allergies:  Sulfa Antibiotics Codeine Morphine  Amiodarone  Meperidine Tramadol Hcl   OR events Bypass time 2h 26 m Clamp time 1hr 20 m EBL 885 Blood products/cell saver: 482 cell saver Fluids 2500 crystalloid/250 ml albumin    -tissue friable and oozy  -intra op nml bivent fxn w/ no valve issues  Pertinent  Medical History  PAF, HTN, GERD, mild MR.   Significant Hospital Events: Including procedures, antibiotic start and stop dates in addition to other pertinent events   1/21 3V CABG w left atrial appendage clipping   Interim History / Subjective:  Afebrile. Overnight some pain. Nausea better this morning. Has walked. Still having high CT output.   Objective    Blood pressure (!) 121/57, pulse 71, temperature 99 F (37.2 C), resp. rate 14, height 5' 10 (1.778 m), weight 81.5 kg, SpO2 94%. CVP:  [1 mmHg-9 mmHg] 1 mmHg      Intake/Output Summary (Last 24 hours) at 04/07/2024 0657 Last data filed at 04/07/2024 9362 Gross per 24 hour  Intake 1761.89 ml  Output 1395 ml  Net 366.89 ml   Filed Weights   04/05/24 0644 04/06/24 0600 04/07/24 0500  Weight: 73.8  kg 80.9 kg 81.5 kg    Examination: General: elderly woman sitting up in the chair in NAD HENT: Chicken/AT, eyes anicteric  Lungs: thin serosanguinous chest tube output, rhales. Cardiovascular: S1S2, RRR Abdomen: soft, NT  Extremities: +edema, no cyanosis Neuro: awake, alert, answering questions appropriately GU: foley  Chest tube otput 820cc over 24 hrs   Na+  129 BUN 19 Cr 0.65 WBC 13.5 H/H 7.3/22.3 Platelets 142 CXR personally reviewed> improving pneumomediastinum, pulmonary edema, likely small  pleural effusions bilaterally  Resolved problem list   Assessment and Plan   Multi-vessel CAD now s/p CABG X 3 w/ left atrial appendage clipping w/ right SVG -post-op care per TCTS -needs diuresis-- lasix  40mg  x 3 doses today -keep foley for strict I/O -aspirin , statin -con't metoprolol  BID -keep chest tubes with ongoing high output -pain control per protocol- morphine , oxy, tramadol PRN -progress diet and mobility  H/o paroxysmal Afib -con't PTA dronedarone  -has allergy to amiodarone  -restart DVT prophylaxis today, hopefully back on full AC this weekend -tele monitoring  Post-op anemia, not clinically significant, not unexpected -enteral iron BIDAC + bowel regimen -transfuse for Hb <7 or hemodynamically significant bleeding  Hyponatremia -avoid hypotonic fluids; encourage diet -strict I/O -diuresis  Hyperglycemia -SSI PRN -goal BG <140  H/o HTN -hold PTA amlodipine , imdur , Toprol   GERD -can switch back to PTA pepcid   Labs   CBC: Recent Labs  Lab 04/05/24 1540 04/05/24 1825 04/05/24  2010 04/05/24 2029 04/06/24 0345 04/06/24 1611 04/07/24 0530  WBC 16.8*  --   --  11.6* 7.3 9.6 13.5*  HGB 8.2*  9.4*   < > 5.4* 8.1* 7.5* 7.8* 7.3*  HCT 24.0*  27.4*   < > 16.0* 24.4* 23.0* 24.0* 22.3*  MCV 85.9  --   --  88.4 87.8 89.9 89.9  PLT 171  --   --  126* 107* 116* 142*   < > = values in this interval not displayed.    Basic Metabolic Panel: Recent Labs   Lab 04/04/24 0900 04/05/24 0906 04/05/24 1351 04/05/24 1355 04/05/24 2010 04/05/24 2029 04/06/24 0345 04/06/24 1611 04/07/24 0530  NA 137   < > 139   < > 142 138 136 134* 129*  K 5.0   < > 4.7   < > 3.6 4.5 4.5 4.8 4.7  CL 103   < > 105  --   --  105 104 102 98  CO2 24  --   --   --   --  23 23 25 24   GLUCOSE 90   < > 125*  --   --  146* 139* 151* 116*  BUN 19   < > 17  --   --  15 15 18 19   CREATININE 0.88   < > 0.70  --   --  0.71 0.67 0.70 0.65  CALCIUM  9.5  --   --   --   --  7.9* 7.9* 8.5* 8.4*  MG  --   --   --   --   --  3.2* 2.6* 2.5*  --    < > = values in this interval not displayed.   GFR: Estimated Creatinine Clearance: 64.7 mL/min (by C-G formula based on SCr of 0.65 mg/dL). Recent Labs  Lab 04/05/24 2029 04/06/24 0345 04/06/24 1611 04/07/24 0530  WBC 11.6* 7.3 9.6 13.5*       Critical care time   Leita SHAUNNA Gaskins, DO 04/07/24 8:15 AM [facility]  For contact information, see Amion. If no response to pager, please call PCCM 2H APP. After hours, 7PM- 7AM, please call on call APP for 2H.      "

## 2024-04-07 NOTE — Progress Notes (Signed)
 Physical Therapy Treatment Patient Details Name: Ann Diaz MRN: [id number] DOB: 1945-08-28 Today's Date: 04/07/2024   History of Present Illness 79 year old woman presented to her Cardiologist with atrial fibrillation, prompting a CT coronary scan which demonstrated significant calcium . 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 PMH: paroxysmal atrial fibrillation hypertension GERD and mild mitral regurgitation.    PT Comments  Pt reporting she wants to get up and walk. Pt is able to progress her ambulation distance however has poor insight into her fatigue, and communicating need to go back to room. Pt able to initiate gait in EVA walker with min A, once she reports her fatigue she still needs to get back to room. She increases her gait speed, with decreased command follow, progressing to mod and then maxA to maintain stability with getting back to her room. RN notified to bring chair follow in future for safety.     If plan is discharge home, recommend the following: A little help with walking and/or transfers;A little help with bathing/dressing/bathroom;Assistance with cooking/housework;Assist for transportation;Help with stairs or ramp for entrance   Can travel by private vehicle      Yes  Equipment Recommendations  None recommended by PT    Recommendations for Other Services Rehab consult     Precautions / Restrictions Precautions Precautions: Sternal Recall of Precautions/Restrictions: Impaired Precaution/Restrictions Comments: educated to move in the tube, no pushing, pulling lifting, chest tube and portable wound vac. Restrictions Weight Bearing Restrictions Per Provider Order: Yes (sternal precautions)     Mobility  Bed Mobility Overal bed mobility: Needs Assistance Bed Mobility: Sit to Sidelying, Rolling, Sidelying to Sit Rolling: Contact guard assist Sidelying to sit: Mod assist     Sit to sidelying: Mod  assist General bed mobility comments: pt able to manage LE to EoB needs modA for bringing trunk to upright, needs modA for returning LE to bed and contact guard for rolling to place bed pad    Transfers Overall transfer level: Needs assistance Equipment used:  (EVA walker) Transfers: Sit to/from Stand, Bed to chair/wheelchair/BSC Sit to Stand: Mod assist, From elevated surface          Lateral/Scoot Transfers: Mod assist General transfer comment: modA for power up to EVA walker while pt held heart pillow, modA for lateral scooting towards HoB to return to supine    Ambulation/Gait Ambulation/Gait assistance: Min assist, Mod assist, Max assist Gait Distance (Feet): 150 Feet Assistive device: Elyn Finder Gait Pattern/deviations: Step-through pattern, Decreased step length - left, Antalgic, Trunk flexed Gait velocity: slowed Gait velocity interpretation: <1.31 ft/sec, indicative of household ambulator   General Gait Details: pt with difficulty communicating her fatigue with ambulation, despite constant questioning from PT, able to ambulate about 100 feet with min A, requrires mod progressing to maxA to maintain upright with increasing velocity to return to room. unable to follow commands for upright posture and maximizing body mechanics      Balance Overall balance assessment: Needs assistance Sitting-balance support: Feet supported, No upper extremity supported Sitting balance-Leahy Scale: Good     Standing balance support: During functional activity, Reliant on assistive device for balance, Bilateral upper extremity supported Standing balance-Leahy Scale: Poor Standing balance comment: requires UE support for dynamic activity                            Communication Communication Communication: No apparent difficulties  Cognition Arousal: Alert Behavior During  Therapy: Flat affect, WFL for tasks assessed/performed   PT - Cognitive impairments: No apparent  impairments                         Following commands: Intact      Cueing Cueing Techniques: Verbal cues, Gestural cues, Tactile cues     General Comments General comments (skin integrity, edema, etc.): VSS on 3L O2 via [place], poor pleth wave with ambulation but when pleth wave present in 90%O2      Pertinent Vitals/Pain Pain Assessment Pain Assessment: Faces Faces Pain Scale: Hurts whole lot Pain Location: chest tube insertion, headache Pain Descriptors / Indicators: Discomfort, Grimacing, Guarding, Throbbing, Tightness Pain Intervention(s): Limited activity within patient's tolerance, Monitored during session, Repositioned     PT Goals (current goals can now be found in the care plan section) Acute Rehab PT Goals PT Goal Formulation: With patient/family Time For Goal Achievement: 04/20/24 Potential to Achieve Goals: Fair Progress towards PT goals: Progressing toward goals    Frequency    Min 4X/week       AM-PAC PT 6 Clicks Mobility   Outcome Measure  Help needed turning from your back to your side while in a flat bed without using bedrails?: A Little Help needed moving from lying on your back to sitting on the side of a flat bed without using bedrails?: A Lot Help needed moving to and from a bed to a chair (including a wheelchair)?: A Lot Help needed standing up from a chair using your arms (e.g., wheelchair or bedside chair)?: A Lot Help needed to walk in hospital room?: A Lot Help needed climbing 3-5 steps with a railing? : Total 6 Click Score: 12    End of Session Equipment Utilized During Treatment: Gait belt;Oxygen Activity Tolerance: Patient limited by fatigue;Other (comment) (difficulty expressing fatigue to therapist) Patient left: in chair;with call bell/phone within reach;with nursing/sitter in room;with family/visitor present Nurse Communication: Mobility status PT Visit Diagnosis: Unsteadiness on feet (R26.81);Other abnormalities of gait  and mobility (R26.89);Muscle weakness (generalized) (M62.81);Difficulty in walking, not elsewhere classified (R26.2);Pain Pain - part of body:  (chest)     Time: 8595-8568 PT Time Calculation (min) (ACUTE ONLY): 27 min  Charges:    $Gait Training: 8-22 mins $Therapeutic Activity: 8-22 mins PT General Charges $$ ACUTE PT VISIT: 1 Visit                     Landers Prajapati B. Fleeta Diaz PT, DPT Acute Rehabilitation Services Please use secure chat or  Call Office [phone]    Ann KATHEE Fleeta [facility] 04/07/2024, 4:43 PM

## 2024-04-07 NOTE — Evaluation (Signed)
 Occupational Therapy Evaluation Patient Details Name: Ann Diaz MRN: [id number] DOB: 11/04/1945 Today's Date: 04/07/2024   History of Present Illness   79 year old woman presented to her Cardiologist with atrial fibrillation, prompting a CT coronary scan which demonstrated significant calcium . 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 PMH: paroxysmal atrial fibrillation hypertension GERD and mild mitral regurgitation.     Clinical Impressions Pt admitted based on above, and was seen based on problem list below. PTA pt was independent with ADLs and IADLs. Today pt is requiring set up  to mod assist for ADLs. Bed mobility and functional transfers are  mod assist. Pt limited by decreased knowledge of precautions, strength, balance, and activity tolerance. Pt active PTA, and anticipate would progress well with intensive rehab. Recommending >3 hours of skilled rehab daily. OT will continue to follow acutely to maximize functional independence.     If plan is discharge home, recommend the following:   A lot of help with walking and/or transfers;A lot of help with bathing/dressing/bathroom;Assistance with cooking/housework;Assist for transportation;Supervision due to cognitive status     Functional Status Assessment   Patient has had a recent decline in their functional status and demonstrates the ability to make significant improvements in function in a reasonable and predictable amount of time.     Equipment Recommendations   Other (comment) (Defer to next venue)     Recommendations for Other Services   Rehab consult     Precautions/Restrictions   Precautions Precautions: Sternal Recall of Precautions/Restrictions: Impaired Restrictions Weight Bearing Restrictions Per Provider Order: No Other Position/Activity Restrictions: cardiac sternal precautions     Mobility Bed Mobility Overal bed mobility: Needs  Assistance Bed Mobility: Sit to Sidelying, Rolling Rolling: Contact guard assist       Sit to sidelying: Mod assist General bed mobility comments: Assist to bring BLEs in bed    Transfers Overall transfer level: Needs assistance Equipment used: Rolling walker (2 wheels) Transfers: Sit to/from Stand Sit to Stand: Mod assist           General transfer comment: mod assist, with cues for maintaing sternal precautions      Balance Overall balance assessment: Needs assistance Sitting-balance support: Feet supported, No upper extremity supported Sitting balance-Leahy Scale: Good     Standing balance support: During functional activity, Reliant on assistive device for balance, Bilateral upper extremity supported Standing balance-Leahy Scale: Poor Standing balance comment: reliant on RW         ADL either performed or assessed with clinical judgement   ADL Overall ADL's : Needs assistance/impaired Eating/Feeding: Set up;Sitting   Grooming: Set up;Sitting   Upper Body Bathing: Minimal assistance;Sitting   Lower Body Bathing: Moderate assistance;Sit to/from stand   Upper Body Dressing : Minimal assistance;Sitting   Lower Body Dressing: Moderate assistance;Sit to/from stand   Toilet Transfer: Moderate assistance;Ambulation;Rolling walker (2 wheels)   Toileting- Clothing Manipulation and Hygiene: Moderate assistance;Sit to/from stand       Functional mobility during ADLs: Moderate assistance;Rolling walker (2 wheels) General ADL Comments: Pt limited by decreased activity tolerance     Vision Baseline Vision/History: 0 No visual deficits Vision Assessment?: No apparent visual deficits            Pertinent Vitals/Pain Pain Assessment Pain Assessment: Faces Faces Pain Scale: Hurts whole lot Pain Location: headache Pain Descriptors / Indicators: Discomfort, Grimacing, Guarding, Throbbing, Tightness Pain Intervention(s): Monitored during session      Extremity/Trunk Assessment Upper Extremity Assessment Upper  Extremity Assessment: Generalized weakness   Lower Extremity Assessment Lower Extremity Assessment: Defer to PT evaluation   Cervical / Trunk Assessment Cervical / Trunk Assessment: Kyphotic   Communication Communication Communication: No apparent difficulties   Cognition Arousal: Lethargic Behavior During Therapy: Flat affect Cognition: Cognition impaired     Awareness: Online awareness impaired Memory impairment (select all impairments): Short-term memory Attention impairment (select first level of impairment): Sustained attention Executive functioning impairment (select all impairments): Sequencing, Problem solving, Reasoning OT - Cognition Comments: Pt with flat affect and lethargic, potentially impacting cog. Able to verbalize sternal precautions, but poor implementation functionally     Following commands: Intact       Cueing  General Comments   Cueing Techniques: Verbal cues;Gestural cues;Tactile cues  Session conducted on 3L, pt destat, requires cues for pursed lip breathing, able to recover to 94%           Home Living Family/patient expects to be discharged to:: Private residence Living Arrangements: Spouse/significant other Available Help at Discharge: Family;Available 24 hours/day Type of Home: House Home Access: Stairs to enter [employer] of Steps: 1 Entrance Stairs-Rails: None Home Layout: Two level Alternate Level Stairs-Number of Steps: 6 Alternate Level Stairs-Rails: Right Bathroom Shower/Tub: Producer, Television/film/video: Standard Bathroom Accessibility: Yes   Home Equipment: [employer]ant (2 wheels);Shower seat - built in   Additional Comments: Primary bed/bath is upstairs. Pt lives in split level home      Prior Functioning/Environment Prior Level of Function : Independent/Modified Independent   Mobility Comments: Ind, no AD ADLs Comments: Ind     OT Problem List: Decreased strength;Decreased activity tolerance;Impaired balance (sitting and/or standing);Decreased cognition;Decreased knowledge of use of DME or AE;Decreased safety awareness;Decreased knowledge of precautions;Cardiopulmonary status limiting activity   OT Treatment/Interventions: Self-care/ADL training;Therapeutic exercise;Energy conservation;DME and/or AE instruction;Therapeutic activities;Patient/family education;Balance training      OT Goals(Current goals can be found in the care plan section)   Acute Rehab OT Goals Patient Stated Goal: To rest OT Goal Formulation: With patient Time For Goal Achievement: 04/21/24 Potential to Achieve Goals: Good   OT Frequency:  Min 2X/week       AM-PAC OT 6 Clicks Daily Activity     Outcome Measure Help from another person eating meals?: None Help from another person taking care of personal grooming?: A Little Help from another person toileting, which includes using toliet, bedpan, or urinal?: A Lot Help from another person bathing (including washing, rinsing, drying)?: A Lot Help from another person to put on and taking off regular upper body clothing?: A Little Help from another person to put on and taking off regular lower body clothing?: A Lot 6 Click Score: 16   End of Session Equipment Utilized During Treatment: Rolling walker (2 wheels);Oxygen Nurse Communication: Mobility status  Activity Tolerance: Patient tolerated treatment well Patient left: in bed;with call bell/phone within reach;with family/visitor present  OT Visit Diagnosis: Unsteadiness on feet (R26.81);Other abnormalities of gait and mobility (R26.89);Muscle weakness (generalized) (M62.81)                Time: 9067-8994 OT Time Calculation (min): 33 min Charges:  OT General Charges $OT Visit: 1 Visit OT Evaluation $OT Eval Moderate Complexity: 1 Mod OT Treatments $Self Care/Home Management : 23-37 mins  Adrianne BROCKS, OT  Acute Rehabilitation  Services Office [phone] Secure chat preferred   Adrianne GORMAN Savers 04/07/2024, 12:12 PM

## 2024-04-08 DIAGNOSIS — K5903 Drug induced constipation: Secondary | ICD-10-CM

## 2024-04-08 DIAGNOSIS — R739 Hyperglycemia, unspecified: Secondary | ICD-10-CM | POA: Diagnosis not present

## 2024-04-08 DIAGNOSIS — E871 Hypo-osmolality and hyponatremia: Secondary | ICD-10-CM | POA: Diagnosis not present

## 2024-04-08 DIAGNOSIS — Z951 Presence of aortocoronary bypass graft: Secondary | ICD-10-CM | POA: Diagnosis not present

## 2024-04-08 DIAGNOSIS — K219 Gastro-esophageal reflux disease without esophagitis: Secondary | ICD-10-CM | POA: Diagnosis not present

## 2024-04-08 LAB — BASIC METABOLIC PANEL WITH GFR
Anion gap: 11 (ref 5–15)
BUN: 24 mg/dL — ABNORMAL HIGH (ref 8–23)
CO2: 28 mmol/L (ref 22–32)
Calcium: 8 mg/dL — ABNORMAL LOW (ref 8.9–10.3)
Chloride: 94 mmol/L — ABNORMAL LOW (ref 98–111)
Creatinine, Ser: 0.91 mg/dL (ref 0.44–1.00)
GFR, Estimated: >60 mL/min
Glucose, Bld: 131 mg/dL — ABNORMAL HIGH (ref 70–99)
Potassium: 4 mmol/L (ref 3.5–5.1)
Sodium: 132 mmol/L — ABNORMAL LOW (ref 135–145)

## 2024-04-08 LAB — CBC
HCT: 21.9 % — ABNORMAL LOW (ref 36.0–46.0)
Hemoglobin: 7.5 g/dL — ABNORMAL LOW (ref 12.0–15.0)
MCH: 29.4 pg (ref 26.0–34.0)
MCHC: 34.2 g/dL (ref 30.0–36.0)
MCV: 85.9 fL (ref 80.0–100.0)
Platelets: 159 10*3/uL (ref 150–400)
RBC: 2.55 MIL/uL — ABNORMAL LOW (ref 3.87–5.11)
RDW: 14.7 % (ref 11.5–15.5)
WBC: 13.2 10*3/uL — ABNORMAL HIGH (ref 4.0–10.5)
nRBC: 0 % (ref 0.0–0.2)

## 2024-04-08 LAB — GLUCOSE, CAPILLARY
Glucose-Capillary: 104 mg/dL — ABNORMAL HIGH (ref 70–99)
Glucose-Capillary: 111 mg/dL — ABNORMAL HIGH (ref 70–99)
Glucose-Capillary: 120 mg/dL — ABNORMAL HIGH (ref 70–99)
Glucose-Capillary: 124 mg/dL — ABNORMAL HIGH (ref 70–99)
Glucose-Capillary: 149 mg/dL — ABNORMAL HIGH (ref 70–99)
Glucose-Capillary: 96 mg/dL (ref 70–99)

## 2024-04-08 LAB — MAGNESIUM: Magnesium: 1.9 mg/dL (ref 1.7–2.4)

## 2024-04-08 MED ORDER — MAGNESIUM SULFATE 2 GM/50ML IV SOLN
2.0000 g | Freq: Once | INTRAVENOUS | Status: AC
  Administered 2024-04-08: 2 g via INTRAVENOUS
  Filled 2024-04-08: qty 50

## 2024-04-08 MED ORDER — POTASSIUM CHLORIDE CRYS ER 20 MEQ PO TBCR
20.0000 meq | EXTENDED_RELEASE_TABLET | Freq: Three times a day (TID) | ORAL | Status: AC
  Administered 2024-04-08 (×3): 20 meq via ORAL
  Filled 2024-04-08 (×3): qty 1

## 2024-04-08 MED ORDER — SENNA 8.6 MG PO TABS
1.0000 | ORAL_TABLET | Freq: Every day | ORAL | Status: DC
  Administered 2024-04-08: 8.6 mg via ORAL
  Filled 2024-04-08: qty 1

## 2024-04-08 MED ORDER — FUROSEMIDE 10 MG/ML IJ SOLN
40.0000 mg | Freq: Two times a day (BID) | INTRAMUSCULAR | Status: AC
  Administered 2024-04-08 (×2): 40 mg via INTRAVENOUS
  Filled 2024-04-08 (×2): qty 4

## 2024-04-08 MED ORDER — INSULIN ASPART 100 UNIT/ML IJ SOLN
0.0000 [IU] | Freq: Three times a day (TID) | INTRAMUSCULAR | Status: DC
  Administered 2024-04-08 (×2): 2 [IU] via SUBCUTANEOUS
  Filled 2024-04-08 (×2): qty 2

## 2024-04-08 MED ORDER — SORBITOL 70 % SOLN
60.0000 mL | Freq: Once | Status: AC
  Administered 2024-04-08: 60 mL via ORAL
  Filled 2024-04-08: qty 60

## 2024-04-08 NOTE — Plan of Care (Signed)

## 2024-04-08 NOTE — Progress Notes (Signed)
 "  NAME:  Ann Diaz, MRN:  [id number], DOB:  04/09/1945, LOS: 3 ADMISSION DATE:  04/05/2024, CONSULTATION DATE:  1/21 REFERRING MD:  SU, CHIEF COMPLAINT:  post CABG critical care    History of Present Illness:  79 year old female w/ prior h/o HTN, GERD and mild MR. Hospitalized Nov 2025 w/ new onset AF.  As part of evaluation underwent Coronary CT  12/17 and found to have svr CAD and subsequently underwent diagnostic left heart cath on 12/18. : Left main 50%, LAD 75%, D1 70%, Distal Cx 80%, Prox RCA 80%; TTE (11/20/23): Normal BiV function, mild-mod mitral regurgitation w/ hemodynamically significant prox LAD stenosis. Because she also had class  III angina was referred to cardiothoracic surgery. She was evaluated on 1/15, DOAC for her AF placed on hold and she presented for CABG w/ LA appendage clipping   Note multiple allergies:  Sulfa Antibiotics Codeine Morphine  Amiodarone  Meperidine Tramadol Hcl   OR events Bypass time 2h 26 m Clamp time 1hr 20 m EBL 885 Blood products/cell saver: 482 cell saver Fluids 2500 crystalloid/250 ml albumin    -tissue friable and oozy  -intra op nml bivent fxn w/ no valve issues  Pertinent  Medical History  PAF, HTN, GERD, mild MR.   Significant Hospital Events: Including procedures, antibiotic start and stop dates in addition to other pertinent events   1/21 3V CABG w left atrial appendage clipping   Interim History / Subjective:  Nausea better yesterday and overnight, still very poor appetite. No BM yet. Pain is better controlled today.  Objective    Blood pressure 128/61, pulse 81, temperature 100 F (37.8 C), resp. rate 18, height 5' 10 (1.778 m), weight 81.5 kg, SpO2 96%.        Intake/Output Summary (Last 24 hours) at 04/08/2024 1102 Last data filed at 04/08/2024 0900 Gross per 24 hour  Intake 661.96 ml  Output 2785 ml  Net -2123.04 ml   Filed Weights   04/05/24 0644 04/06/24 0600 04/07/24 0500  Weight: 73.8 kg 80.9 kg 81.5 kg     Examination: General: elderly woman sitting up in bed in NAD HENT: [place]/AT, eyes anicteric Lungs: breathing comfortably on [place], reduced basilar breath sounds, weak inspiratory effort Cardiovascular: S1S2, RRR. Serosanguinous chest tube output.  Abdomen: soft, NT  Extremities:  ++edema, no cyanosis Neuro: lethargic but arouses to verbal stimulation, moving all extremities GU: foley with clear yellow urine  Chest tube otput 270cc over 24 hrs I/O -2.9L past 24 hrs, still 1L positive Wt +17# since admission   Na+  132 K+ 4.0 BUN 24 Cr 0.91 WBC 13.2 H/H 7.5/21.9 Platelets 159   Resolved problem list   Assessment and Plan   Multi-vessel CAD now s/p CABG X 3 w/ left atrial appendage clipping w/ right SVG -post-op care per TCTS -chest tubes out today since significantly reduced output -needs more diuresis-- lasix  40mg  x 2 doses today -strict I/O -aspirin , statin -con't metoprolol  BID -pain control per protocol- tramadol and oxy -con't efforts at progressing diet and mobility -tele monitoring  H/o paroxysmal Afib -con't PTA dronedarone ; intolerant to amiodarone  in the past. Appreciate EP's input.  -con't DVT prophylaxis.  -tele monitoring  Post-op anemia, not clinically significant, not unexpected. Stable.  -enteral iron; daily miralax  with this.  -no current need for transfusion  Constipation -adding senna, con't daily miralax  -one time sorbitol  today -mobility -encourage PO intake  Poor appetite, PO intake -family can bring in food if she prefers -aggressive bowel regimen  today -encouraging PO intake  Hyponatremia; improving -avoid hypotonic fluids; encourage diet -strict I/O -diuresis  Hyperglycemia; minimal insulin  requirements -SSI PRN -goal BG <140  H/o HTN -con't to hold PTA imdur , amlodipine , Toprol .    GERD -con't PTA pepcid   Deconditioning -PT, OT-- recommending CIR consult. Order placed.  Husband updated at bedside during rounds.    Labs   CBC: Recent Labs  Lab 04/05/24 2029 04/06/24 0345 04/06/24 1611 04/07/24 0530 04/08/24 0443  WBC 11.6* 7.3 9.6 13.5* 13.2*  HGB 8.1* 7.5* 7.8* 7.3* 7.5*  HCT 24.4* 23.0* 24.0* 22.3* 21.9*  MCV 88.4 87.8 89.9 89.9 85.9  PLT 126* 107* 116* 142* 159    Basic Metabolic Panel: Recent Labs  Lab 04/05/24 2029 04/06/24 0345 04/06/24 1611 04/07/24 0530 04/08/24 0443  NA 138 136 134* 129* 132*  K 4.5 4.5 4.8 4.7 4.0  CL 105 104 102 98 94*  CO2 23 23 25 24 28   GLUCOSE 146* 139* 151* 116* 131*  BUN 15 15 18 19  24*  CREATININE 0.71 0.67 0.70 0.65 0.91  CALCIUM  7.9* 7.9* 8.5* 8.4* 8.0*  MG 3.2* 2.6* 2.5*  --  1.9   GFR: Estimated Creatinine Clearance: 56.9 mL/min (by C-G formula based on SCr of 0.91 mg/dL). Recent Labs  Lab 04/06/24 0345 04/06/24 1611 04/07/24 0530 04/08/24 0443  WBC 7.3 9.6 13.5* 13.2*       Critical care time   Leita SHAUNNA Gaskins, DO 04/08/24 11:02 AM [facility]  For contact information, see Amion. If no response to pager, please call PCCM 2H APP. After hours, 7PM- 7AM, please call on call APP for 2H.      "

## 2024-04-08 NOTE — Progress Notes (Signed)
 3 Days Post-Op Procedures (LRB): CORONARY ARTERY BYPASS GRAFTING X 3, USING LEFT INTERNAL MAMMARY ARTERY AND RIGHT ENDOSCOPIC HARVESTED GREATER SAPHENOUS VEIN (N/A) CLIPPING, LEFT ATRIAL APPENDAGE USING ATRICLIP (N/A) ECHOCARDIOGRAM, TRANSESOPHAGEAL, INTRAOPERATIVE (N/A) ISOLATION, VEIN, PULMONARY, BILATERAL Subjective:  No complaints this am. She had delirium overnight and was agitated. Better this am.  Objective: Vital signs in last 24 hours: Temp:  [98.8 F (37.1 C)-100.4 F (38 C)] 100.2 F (37.9 C) (01/24 0600) Pulse Rate:  [69-79] 74 (01/24 0600) Cardiac Rhythm: Normal sinus rhythm (01/23 2100) Resp:  [12-22] 15 (01/24 0600) BP: (86-153)/(42-112) 145/58 (01/24 0600) SpO2:  [88 %-98 %] 94 % (01/24 0600)  Hemodynamic parameters for last 24 hours:    Intake/Output from previous day: 01/23 0701 - 01/24 0700 In: 620 [P.O.:620] Out: 3535 [Urine:3265; Chest Tube:270] Intake/Output this shift: No intake/output data recorded.  General appearance: alert and cooperative Neurologic: intact Heart: regular rate and rhythm Lungs: diminished breath sounds bibasilar Extremities: edema mild Wound: Prevena in place. Right leg incision ok. There is some ecchymosis of thigh as expected.  Lab Results: Recent Labs    04/07/24 0530 04/08/24 0443  WBC 13.5* 13.2*  HGB 7.3* 7.5*  HCT 22.3* 21.9*  PLT 142* 159   BMET:  Recent Labs    04/07/24 0530 04/08/24 0443  NA 129* 132*  K 4.7 4.0  CL 98 94*  CO2 24 28  GLUCOSE 116* 131*  BUN 19 24*  CREATININE 0.65 0.91  CALCIUM  8.4* 8.0*    PT/INR:  Recent Labs    04/05/24 1540  LABPROT 17.8*  INR 1.4*   ABG    Component Value Date/Time   PHART 7.340 (L) 04/05/2024 2010   HCO3 16.2 (L) 04/05/2024 2010   TCO2 17 (L) 04/05/2024 2010   ACIDBASEDEF 9.0 (H) 04/05/2024 2010   O2SAT 98 04/05/2024 2010   CBG (last 3)  Recent Labs    04/07/24 2358 04/08/24 0439 04/08/24 0808  GLUCAP 104* 96 120*     Assessment/Plan: S/P Procedures (LRB): CORONARY ARTERY BYPASS GRAFTING X 3, USING LEFT INTERNAL MAMMARY ARTERY AND RIGHT ENDOSCOPIC HARVESTED GREATER SAPHENOUS VEIN (N/A) CLIPPING, LEFT ATRIAL APPENDAGE USING ATRICLIP (N/A) ECHOCARDIOGRAM, TRANSESOPHAGEAL, INTRAOPERATIVE (N/A) ISOLATION, VEIN, PULMONARY, BILATERAL  POD 3 Hemodynamically stable in NSR 80's on Lopressor  and Multaq . DC pacing wires.  -3L yesterday. No wt today but still edematous so will continue diuresis and KCL.  CT output 70 last shift. Will remove later after pacing wires removal.  Keep foley and sleeve today for further diuresis and should be able to remove tomorrow.  IS, ambulation. PT/OT.   LOS: 3 days    Ann Diaz 04/08/2024

## 2024-04-08 NOTE — Plan of Care (Signed)
" °  Problem: Clinical Measurements: Goal: Ability to maintain clinical measurements within normal limits will improve Outcome: Progressing Goal: Diagnostic test results will improve Outcome: Progressing Goal: Respiratory complications will improve Outcome: Progressing Goal: Cardiovascular complication will be avoided Outcome: Progressing   Problem: Skin Integrity: Goal: Risk for impaired skin integrity will decrease Outcome: Progressing   Problem: Cardiac: Goal: Will achieve and/or maintain hemodynamic stability Outcome: Progressing   Problem: Clinical Measurements: Goal: Postoperative complications will be avoided or minimized Outcome: Progressing   "

## 2024-04-08 NOTE — Progress Notes (Signed)
 Patient ID: Ann Diaz, female   DOB: 14-Jul-1945, 79 y.o.   MRN: [id number]  TCTS  Evening Rounds:  Hemodynamically stable in NSR.  Diuresing well.  Bowels working now.  Ambulated in hall.

## 2024-04-08 NOTE — Discharge Instructions (Signed)
 Discharge Instructions:  1. You may shower, please wash incisions daily with soap and water  and keep dry.  If you wish to cover wounds with dressing you may do so but please keep clean and change daily.  No tub baths or swimming until incisions have completely healed.  If your incisions become red or develop any drainage please call our office at [phone]  2. No Driving until cleared by Dr. Linward office and you are no longer using narcotic pain medications  3. Monitor your weight daily.. Please use the same scale and weigh at same time... If you gain 5-10 lbs in 48 hours with associated lower extremity swelling, please contact our office at [phone]  4. Fever of 101.5 for at least 24 hours with no source, please contact our office at [phone]  5. Activity- up as tolerated, please walk at least 3 times per day.  Avoid strenuous activity, no lifting, pushing, or pulling with your arms over 8-10 lbs for a minimum of 6 weeks  6. If any questions or concerns arise, please do not hesitate to contact our office at [phone]

## 2024-04-09 DIAGNOSIS — K219 Gastro-esophageal reflux disease without esophagitis: Secondary | ICD-10-CM | POA: Diagnosis not present

## 2024-04-09 DIAGNOSIS — R739 Hyperglycemia, unspecified: Secondary | ICD-10-CM | POA: Diagnosis not present

## 2024-04-09 DIAGNOSIS — E871 Hypo-osmolality and hyponatremia: Secondary | ICD-10-CM | POA: Diagnosis not present

## 2024-04-09 DIAGNOSIS — Z951 Presence of aortocoronary bypass graft: Secondary | ICD-10-CM | POA: Diagnosis not present

## 2024-04-09 LAB — TYPE AND SCREEN
ABO/RH(D): A NEG
Antibody Screen: NEGATIVE
Unit division: 0
Unit division: 0
Unit division: 0
Unit division: 0

## 2024-04-09 LAB — BASIC METABOLIC PANEL WITH GFR
Anion gap: 6 (ref 5–15)
BUN: 22 mg/dL (ref 8–23)
CO2: 32 mmol/L (ref 22–32)
Calcium: 8.3 mg/dL — ABNORMAL LOW (ref 8.9–10.3)
Chloride: 92 mmol/L — ABNORMAL LOW (ref 98–111)
Creatinine, Ser: 0.77 mg/dL (ref 0.44–1.00)
GFR, Estimated: >60 mL/min
Glucose, Bld: 96 mg/dL (ref 70–99)
Potassium: 3.7 mmol/L (ref 3.5–5.1)
Sodium: 130 mmol/L — ABNORMAL LOW (ref 135–145)

## 2024-04-09 LAB — CBC
HCT: 21.4 % — ABNORMAL LOW (ref 36.0–46.0)
Hemoglobin: 7.4 g/dL — ABNORMAL LOW (ref 12.0–15.0)
MCH: 29.6 pg (ref 26.0–34.0)
MCHC: 34.6 g/dL (ref 30.0–36.0)
MCV: 85.6 fL (ref 80.0–100.0)
Platelets: 159 10*3/uL (ref 150–400)
RBC: 2.5 MIL/uL — ABNORMAL LOW (ref 3.87–5.11)
RDW: 14.6 % (ref 11.5–15.5)
WBC: 10.1 10*3/uL (ref 4.0–10.5)
nRBC: 0 % (ref 0.0–0.2)

## 2024-04-09 LAB — BPAM RBC
Blood Product Expiration Date: 202602112359
Blood Product Expiration Date: 202602112359
Blood Product Expiration Date: 202602122359
Blood Product Expiration Date: 202602122359
ISSUE DATE / TIME: 202601211239
ISSUE DATE / TIME: 202601211239
Unit Type and Rh: 600
Unit Type and Rh: 600
Unit Type and Rh: 600
Unit Type and Rh: 600

## 2024-04-09 LAB — GLUCOSE, CAPILLARY: Glucose-Capillary: 100 mg/dL — ABNORMAL HIGH (ref 70–99)

## 2024-04-09 LAB — MAGNESIUM: Magnesium: 2 mg/dL (ref 1.7–2.4)

## 2024-04-09 MED ORDER — POTASSIUM CHLORIDE CRYS ER 20 MEQ PO TBCR
20.0000 meq | EXTENDED_RELEASE_TABLET | Freq: Two times a day (BID) | ORAL | Status: AC
  Administered 2024-04-09 – 2024-04-10 (×4): 20 meq via ORAL
  Filled 2024-04-09 (×4): qty 1

## 2024-04-09 MED ORDER — FUROSEMIDE 40 MG PO TABS
40.0000 mg | ORAL_TABLET | Freq: Every day | ORAL | Status: AC
  Administered 2024-04-09 – 2024-04-10 (×2): 40 mg via ORAL
  Filled 2024-04-09 (×2): qty 1

## 2024-04-09 NOTE — Progress Notes (Signed)
 "  NAME:  Ann Diaz, MRN:  [id number], DOB:  [birth year], LOS: 4 ADMISSION DATE:  04/05/2024, CONSULTATION DATE:  1/21 REFERRING MD:  SU, CHIEF COMPLAINT:  post CABG critical care    History of Present Illness:  79 year old female w/ prior h/o HTN, GERD and mild MR. Hospitalized Nov 2025 w/ new onset AF.  As part of evaluation underwent Coronary CT  12/17 and found to have svr CAD and subsequently underwent diagnostic left heart cath on 12/18. : Left main 50%, LAD 75%, D1 70%, Distal Cx 80%, Prox RCA 80%; TTE (11/20/23): Normal BiV function, mild-mod mitral regurgitation w/ hemodynamically significant prox LAD stenosis. Because she also had class  III angina was referred to cardiothoracic surgery. She was evaluated on 1/15, DOAC for her AF placed on hold and she presented for CABG w/ LA appendage clipping   Note multiple allergies:  Sulfa Antibiotics Codeine Morphine  Amiodarone  Meperidine Tramadol Hcl   OR events Bypass time 2h 26 m Clamp time 1hr 20 m EBL 885 Blood products/cell saver: 482 cell saver Fluids 2500 crystalloid/250 ml albumin    -tissue friable and oozy  -intra op nml bivent fxn w/ no valve issues  Pertinent  Medical History  PAF, HTN, GERD, mild MR.   Significant Hospital Events: Including procedures, antibiotic start and stop dates in addition to other pertinent events   1/21 3V CABG w left atrial appendage clipping   Interim History / Subjective:  Constipation resolved. Nausea better. Eating better today. Did not eat dinner last night.   Objective    Blood pressure 117/82, pulse 81, temperature 99 F (37.2 C), resp. rate (!) 24, height 5' 10 (1.778 m), weight 75.8 kg, SpO2 92%.        Intake/Output Summary (Last 24 hours) at 04/09/2024 0735 Last data filed at 04/09/2024 0600 Gross per 24 hour  Intake 170 ml  Output 2700 ml  Net -2530 ml   Filed Weights   04/06/24 0600 04/07/24 0500 04/09/24 0500  Weight: 80.9 kg 81.5 kg 75.8 kg     Examination: General: chronically ill appearing woman sitting up in the chair in NAD HENT: [place]/AT, eyes anicteric Neck RIJ CVC Lungs: breathing comfortably on [place], R basilar rhales Cardiovascular: S1S2, RRR Abdomen: soft, NT Extremities:   improving edema Neuro: more awake and interactive today GU: foley with yellow urine  I/O -2.5L past 24 hrs, -1.6L for admission Wt +5# since admission   Na+  130 K+ 3.7 BUN 22 Cr 0.77 WBC 10.1 H/H 7.4/21.4 Platelets 159   Resolved problem list  Constipation  Assessment and Plan   Multi-vessel CAD now s/p CABG X 3 w/ left atrial appendage clipping w/ right SVG -post-op care per TCTS -d/c foley, CVC today -lasix  once daily -aspirin , statin, metoprolol  BID -pain control per protocol -con't weaning oxygen, pulmonary hygiene -tele monitoring -con't advancing diet and mobility.   H/o paroxysmal Afib -con't dronedarone ; intolerant to amio -con't DVT prophylaxis -can restart full AC when TCTS is ready  Post-op anemia, not clinically significant, not unexpected. Stable.  -con't enteral iron plus miralax  -monitor, not requiring transfusion  Poor appetite, PO intake -con't encouraging PO intake  Hyponatremia; improving -avoid hypotonic fluids -monitor -con't diuresis  Hyperglycemia; minimal insulin  requirements -SSI PRN -goal BG 140-180  H/o HTN -con't to hold PTA imdur , amlodipine , Toprol .    GERD -con't PTA pepcid   Deconditioning -walked 1 small loop this morning, con't working with PT & OT -anticipate she needs CIR  Planning to  transfer to the floor today.   Labs   CBC: Recent Labs  Lab 04/06/24 0345 04/06/24 1611 04/07/24 0530 04/08/24 0443 04/09/24 0447  WBC 7.3 9.6 13.5* 13.2* 10.1  HGB 7.5* 7.8* 7.3* 7.5* 7.4*  HCT 23.0* 24.0* 22.3* 21.9* 21.4*  MCV 87.8 89.9 89.9 85.9 85.6  PLT 107* 116* 142* 159 159    Basic Metabolic Panel: Recent Labs  Lab 04/05/24 2029 04/06/24 0345 04/06/24 1611  04/07/24 0530 04/08/24 0443 04/09/24 0447  NA 138 136 134* 129* 132* 130*  K 4.5 4.5 4.8 4.7 4.0 3.7  CL 105 104 102 98 94* 92*  CO2 23 23 25 24 28  32  GLUCOSE 146* 139* 151* 116* 131* 96  BUN 15 15 18 19  24* 22  CREATININE 0.71 0.67 0.70 0.65 0.91 0.77  CALCIUM  7.9* 7.9* 8.5* 8.4* 8.0* 8.3*  MG 3.2* 2.6* 2.5*  --  1.9 2.0   GFR: Estimated Creatinine Clearance: 64.7 mL/min (by C-G formula based on SCr of 0.77 mg/dL). Recent Labs  Lab 04/06/24 1611 04/07/24 0530 04/08/24 0443 04/09/24 0447  WBC 9.6 13.5* 13.2* 10.1       Critical care time   Ann SHAUNNA Gaskins, DO 04/09/24 9:22 AM [facility]  For contact information, see Amion. If no response to pager, please call PCCM 2H APP. After hours, 7PM- 7AM, please call on call APP for 2H.      "

## 2024-04-09 NOTE — Plan of Care (Signed)

## 2024-04-09 NOTE — Progress Notes (Signed)
 TCTS Evening Rounds:  Hemodynamically stable today. Continuing to diureses. Ambulated well No further confusion. Waiting on 4E bed.

## 2024-04-09 NOTE — Progress Notes (Signed)
 4 Days Post-Op Procedures (LRB): CORONARY ARTERY BYPASS GRAFTING X 3, USING LEFT INTERNAL MAMMARY ARTERY AND RIGHT ENDOSCOPIC HARVESTED GREATER SAPHENOUS VEIN (N/A) CLIPPING, LEFT ATRIAL APPENDAGE USING ATRICLIP (N/A) ECHOCARDIOGRAM, TRANSESOPHAGEAL, INTRAOPERATIVE (N/A) ISOLATION, VEIN, PULMONARY, BILATERAL Subjective: Feels much better. Had BM, ambulated this am. Working on IS. No confusion overnight.  Objective: Vital signs in last 24 hours: Temp:  [98.4 F (36.9 C)-100.8 F (38.2 C)] 99 F (37.2 C) (01/25 0600) Pulse Rate:  [67-82] 81 (01/25 0600) Cardiac Rhythm: Normal sinus rhythm (01/24 2059) Resp:  [8-24] 24 (01/25 0600) BP: (87-142)/(47-82) 117/82 (01/25 0600) SpO2:  [86 %-100 %] 92 % (01/25 0600) Weight:  [75.8 kg] 75.8 kg (01/25 0500)  Hemodynamic parameters for last 24 hours:    Intake/Output from previous day: 01/24 0701 - 01/25 0700 In: 170 [P.O.:120; IV Piggyback:50] Out: 2700 [Urine:2700] Intake/Output this shift: No intake/output data recorded.  General appearance: alert and cooperative Neurologic: intact Heart: regular rate and rhythm Lungs: diminished breath sounds RLL Extremities: trace edema in ankles Wound: Prevena stopped working. Will remove today. Leg incision ok. Stable right thigh ecchymosis.  Lab Results: Recent Labs    04/08/24 0443 04/09/24 0447  WBC 13.2* 10.1  HGB 7.5* 7.4*  HCT 21.9* 21.4*  PLT 159 159   BMET:  Recent Labs    04/08/24 0443 04/09/24 0447  NA 132* 130*  K 4.0 3.7  CL 94* 92*  CO2 28 32  GLUCOSE 131* 96  BUN 24* 22  CREATININE 0.91 0.77  CALCIUM  8.0* 8.3*    PT/INR: No results for input(s): LABPROT, INR in the last 72 hours. ABG    Component Value Date/Time   PHART 7.340 (L) 04/05/2024 2010   HCO3 16.2 (L) 04/05/2024 2010   TCO2 17 (L) 04/05/2024 2010   ACIDBASEDEF 9.0 (H) 04/05/2024 2010   O2SAT 98 04/05/2024 2010   CBG (last 3)  Recent Labs    04/08/24 1606 04/08/24 2113  04/09/24 0633  GLUCAP 124* 111* 100*    Assessment/Plan: S/P Procedures (LRB): CORONARY ARTERY BYPASS GRAFTING X 3, USING LEFT INTERNAL MAMMARY ARTERY AND RIGHT ENDOSCOPIC HARVESTED GREATER SAPHENOUS VEIN (N/A) CLIPPING, LEFT ATRIAL APPENDAGE USING ATRICLIP (N/A) ECHOCARDIOGRAM, TRANSESOPHAGEAL, INTRAOPERATIVE (N/A) ISOLATION, VEIN, PULMONARY, BILATERAL  POD 4 Hemodynamically stable in NSR. Continue Lopressor  and Multaq .  -2500 cc yesterday with IV lasix  40 bid. Wt now 4 lbs over preop. Will continue po lasix  for a couple more days.  Glucose under good control and Hgb A1c 5.4 preop on no meds. Stop CBG's  DC sleeve and foley.  Expected acute postop blood loss anemia: stable. Continue iron and stop drawing blood.  Will check 2V CXR in am.  Transfer to 4E and continue IS, ambulation.  PT/OT/ CIR eval   LOS: 4 days    Ann Diaz 04/09/2024

## 2024-04-10 ENCOUNTER — Inpatient Hospital Stay (HOSPITAL_COMMUNITY)

## 2024-04-10 DIAGNOSIS — K219 Gastro-esophageal reflux disease without esophagitis: Secondary | ICD-10-CM | POA: Diagnosis not present

## 2024-04-10 DIAGNOSIS — E871 Hypo-osmolality and hyponatremia: Secondary | ICD-10-CM | POA: Diagnosis not present

## 2024-04-10 DIAGNOSIS — Z951 Presence of aortocoronary bypass graft: Secondary | ICD-10-CM | POA: Diagnosis not present

## 2024-04-10 LAB — GLUCOSE, CAPILLARY: Glucose-Capillary: 84 mg/dL (ref 70–99)

## 2024-04-10 MED ORDER — CHLORHEXIDINE GLUCONATE CLOTH 2 % EX PADS
6.0000 | MEDICATED_PAD | Freq: Every day | CUTANEOUS | Status: DC
  Administered 2024-04-10: 6 via TOPICAL

## 2024-04-10 MED ORDER — FUROSEMIDE 40 MG PO TABS
40.0000 mg | ORAL_TABLET | Freq: Every day | ORAL | Status: DC
  Administered 2024-04-11: 40 mg via ORAL
  Filled 2024-04-10: qty 1

## 2024-04-10 MED ORDER — SODIUM CHLORIDE 0.9 % IV SOLN: 250.0000 mL | INTRAVENOUS | Status: AC | PRN

## 2024-04-10 MED ORDER — APIXABAN 5 MG PO TABS
5.0000 mg | ORAL_TABLET | Freq: Two times a day (BID) | ORAL | Status: DC
  Administered 2024-04-10 – 2024-04-11 (×3): 5 mg via ORAL
  Filled 2024-04-10 (×3): qty 1

## 2024-04-10 MED ORDER — SODIUM CHLORIDE 0.9% FLUSH
3.0000 mL | Freq: Two times a day (BID) | INTRAVENOUS | Status: DC
  Administered 2024-04-10 – 2024-04-11 (×2): 3 mL via INTRAVENOUS

## 2024-04-10 MED ORDER — SODIUM CHLORIDE 0.9% FLUSH: 3.0000 mL | INTRAVENOUS | Status: DC | PRN

## 2024-04-10 MED ORDER — ~~LOC~~ CARDIAC SURGERY, PATIENT & FAMILY EDUCATION: Freq: Once | Status: AC

## 2024-04-10 MED ORDER — METOPROLOL TARTRATE 12.5 MG HALF TABLET
12.5000 mg | ORAL_TABLET | Freq: Two times a day (BID) | ORAL | Status: DC
  Administered 2024-04-10 – 2024-04-11 (×2): 12.5 mg via ORAL
  Filled 2024-04-10 (×2): qty 1

## 2024-04-10 MED ORDER — ASPIRIN 81 MG PO TBEC
81.0000 mg | DELAYED_RELEASE_TABLET | Freq: Every day | ORAL | Status: DC
  Administered 2024-04-10 – 2024-04-11 (×2): 81 mg via ORAL
  Filled 2024-04-10 (×2): qty 1

## 2024-04-10 NOTE — TOC Initial Note (Signed)
 Transition of Care [facility]) - Initial/Assessment Note    Patient Details  Name: Ann Diaz MRN: [id number] Date of Birth: May 10, 1945  Transition of Care [facility]) CM/SW Contact:    Sudie Erminio Deems, RN Phone Number: 04/10/2024, 4:23 PM  Clinical Narrative: Patient POD-5 CABG. PTA patient was from home with support of spouse. Patient has DME: wheelchair, crutches, rollowing walker x 3, and bedside commode in the home. CIR had been following patient and she is mobilizing too well for CIR. Patient is in agreement to home health services. [facility] is following the patient via TCTS office protocol and the patient is aggregate to services. ICM will continue to follow for additional needs as the patient progresses.                     Expected Discharge Plan: Home w Home Health Services Barriers to Discharge: Continued Medical Work up   Patient Goals and CMS Choice Patient states their goals for this hospitalization and ongoing recovery are:: plans to return home with home health services.   Choice offered to / list presented to :  (TCTS protocol- prearranged with [facility] and patient is agreeable)      Expected Discharge Plan and Services In-house Referral: NA Discharge Planning Services: CM Consult Post Acute Care Choice: Home Health Living arrangements for the past 2 months: Single Family Home                   DME Agency: NA       HH Arranged: PT, OT HH Agency: [facility] ([facility]) Date HH Agency Contacted: 04/10/24 Time HH Agency Contacted: 1623 Representative spoke with at [facility] Agency: TCTS Protocol  Prior Living Arrangements/Services Living arrangements for the past 2 months: Single Family Home Lives with:: Spouse Patient language and need for interpreter reviewed:: Yes Do you feel safe going back to the place where you live?: Yes      Need for Family Participation in Patient Care: Yes (Comment) Care giver support system in place?: Yes (comment) Current  home services: DME (wheelchair, crutches, rollowing walker x 3, and bedside commode.) Criminal Activity/Legal Involvement Pertinent to Current Situation/Hospitalization: No - Comment as needed  Activities of Daily Living   ADL Screening (condition at time of admission) Independently performs ADLs?: Yes (appropriate for developmental age) Is the patient deaf or have difficulty hearing?: Yes Does the patient have difficulty seeing, even when wearing glasses/contacts?: No Does the patient have difficulty concentrating, remembering, or making decisions?: No  Permission Sought/Granted Permission sought to share information with : Family Supports, Magazine Features Editor, Case Estate Manager/land Agent granted to share information with : Yes, Verbal Permission Granted     Permission granted to share info w AGENCY: [facility]        Emotional Assessment   Attitude/Demeanor/Rapport: Engaged Affect (typically observed): Appropriate Orientation: : Oriented to Self, Oriented to Place, Oriented to  Time, Oriented to Situation Alcohol / Substance Use: Not Applicable Psych Involvement: No (comment)  Admission diagnosis:  Coronary artery disease involving native coronary artery of native heart without angina pectoris [I25.10] Atrial fibrillation, unspecified type (HCC) [I48.91] S/P CABG x 3 [Z95.1] Patient Active Problem List   Diagnosis Date Noted   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   Dyspnea on exertion 03/02/2024   Coronary artery disease 03/02/2024   Encounter for monitoring flecainide  therapy 02/08/2024   DOE (dyspnea on exertion) 02/08/2024   Moderate mitral regurgitation 01/11/2024   New onset atrial fibrillation (HCC) 10/28/2023  Essential hypertension 10/28/2023   Anxiety    Arthritis    GERD (gastroesophageal reflux disease)    Hypertension    PONV (postoperative nausea and vomiting)    Lumbar pseudoarthrosis 07/31/2021   Spondylolisthesis, lumbar region 11/25/2020   PCP:  Fernand Tracey LABOR, MD Pharmacy:   [facility] [address] [place] - [address] [facility] [place] [address] Phone: [phone] Fax: [phone]  [facility] [address] [place] [address] Phone: [phone] Fax: [phone]     Social Drivers of Health (SDOH) Social History: SDOH Screenings   Food Insecurity: No Food Insecurity (04/06/2024)  Housing: Low Risk (04/06/2024)  Transportation Needs: No Transportation Needs (04/06/2024)  Utilities: Not At Risk (04/06/2024)  Social Connections: Socially Integrated (04/06/2024)  Tobacco Use: Low Risk (04/05/2024)   SDOH Interventions:     Readmission Risk Interventions     No data to display

## 2024-04-10 NOTE — Progress Notes (Signed)
 Physical Therapy Treatment Patient Details Name: Ann Diaz MRN: [id number] DOB: 29-Mar-1945 Today's Date: 04/10/2024   History of Present Illness 79 year old woman presented to her Cardiologist with atrial fibrillation, prompting a CT coronary scan which demonstrated significant calcium . 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 PMH: paroxysmal atrial fibrillation hypertension GERD and mild mitral regurgitation.    PT Comments  Pt is progressing well towards goals. Pt reports that her gait is at baseline. Currently Pt is supervision for bed mobility, sit to stand and CGA for 200 ft of gait with RW. Pt mostly requires cues to maintain sternal precautions which she states spouse is able to assist with at home. 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 Due to pt current functional status, home set up and available assistance at home recommending skilled physical therapy services 3x/week in order to address strength, balance and functional mobility to decrease risk for falls, injury and re-hospitalization.       If plan is discharge home, recommend the following: A little help with walking and/or transfers;A little help with bathing/dressing/bathroom;Assistance with cooking/housework;Assist for transportation;Help with stairs or ramp for entrance     Equipment Recommendations  None recommended by PT       Precautions / Restrictions Precautions Precautions: Sternal Recall of Precautions/Restrictions: Impaired Precaution/Restrictions Comments: educated to move in the tube, no pushing, pulling lifting Restrictions Weight Bearing Restrictions Per Provider Order: Yes RUE Weight Bearing Per Provider Order: Non weight bearing LUE Weight Bearing Per Provider Order: Non weight bearing Other Position/Activity Restrictions: cardiac sternal precautions     Mobility  Bed Mobility Overal bed mobility: Needs  Assistance Bed Mobility: Rolling, Sidelying to Sit Rolling: Supervision Sidelying to sit: HOB elevated, Supervision       General bed mobility comments: HOB slightly elevated, 1x verbal cue to not push with UE    Transfers Overall transfer level: Needs assistance Equipment used: Rolling walker (2 wheels) Transfers: Sit to/from Stand, Bed to chair/wheelchair/BSC Sit to Stand: Supervision   Step pivot transfers: Supervision       General transfer comment: verbal cues for sternal precautions throughout.    Ambulation/Gait Ambulation/Gait assistance: Contact guard assist Gait Distance (Feet): 200 Feet Assistive device: Rolling walker (2 wheels) Gait Pattern/deviations: Step-through pattern, Decreased step length - left, Trunk flexed, Wide base of support Gait velocity: decreased Gait velocity interpretation: <1.31 ft/sec, indicative of household ambulator   General Gait Details: Pt reports at baseline she cannot ambulate far. She has a posterior lean with significant lumbar lordosis during gait which is baseline due to weakness in bil quads from multiple previous back surgeries. Pt has been to rehab mutliple times to work on gait/balance but it has not improved. Pt denies any falls. mild buckling noted at the L knee intermittently during gait without loss of balance. Pt states this is baseline.   Stairs Stairs:  (Pt states that she can go up her steps once and stay. She has a rail she can use and discussed stair safety with sternal precautions.)             Balance Overall balance assessment: Mild deficits observed, not formally tested, Needs assistance Sitting-balance support: Feet supported, No upper extremity supported Sitting balance-Leahy Scale: Good     Standing balance support: During functional activity, Reliant on assistive device for balance, Bilateral upper extremity supported Standing balance-Leahy Scale: Fair Standing balance comment: no overt LOB with AD  Communication Communication Communication: No apparent difficulties  Cognition Arousal: Alert Behavior During Therapy: WFL for tasks assessed/performed   PT - Cognitive impairments: No apparent impairments     Following commands: Intact      Cueing Cueing Techniques: Verbal cues     General Comments General comments (skin integrity, edema, etc.): Poor Pleth line which was then lost during gait. Pt became dyspneic after 200 ft of gait but reports this is baseline. ONce PLeth back O2 sats 90% with pt still short of breathe.      Pertinent Vitals/Pain Pain Assessment Pain Assessment: Faces Faces Pain Scale: Hurts a little bit Facial Expression: Relaxed, neutral Body Movements: Absence of movements Muscle Tension: Relaxed Compliance with ventilator (intubated pts.): N/A Vocalization (extubated pts.): Talking in normal tone or no sound CPOT Total: 0 Pain Location: chest Pain Descriptors / Indicators: Guarding, Discomfort Pain Intervention(s): Monitored during session           PT Goals (current goals can now be found in the care plan section) Acute Rehab PT Goals PT Goal Formulation: With patient/family Time For Goal Achievement: 04/20/24 Potential to Achieve Goals: Fair Progress towards PT goals: Progressing toward goals    Frequency    Min 3X/week      PT Plan  Updated discharge recommendations       AM-PAC PT 6 Clicks Mobility   Outcome Measure  Help needed turning from your back to your side while in a flat bed without using bedrails?: A Little Help needed moving from lying on your back to sitting on the side of a flat bed without using bedrails?: A Little Help needed moving to and from a bed to a chair (including a wheelchair)?: A Little Help needed standing up from a chair using your arms (e.g., wheelchair or bedside chair)?: A Little Help needed to walk in hospital room?: A Little Help needed climbing 3-5 steps with a railing? : A Little 6  Click Score: 18    End of Session Equipment Utilized During Treatment: Gait belt Activity Tolerance: Patient limited by fatigue Patient left: in chair;with call bell/phone within reach;with nursing/sitter in room Nurse Communication: Mobility status PT Visit Diagnosis: Unsteadiness on feet (R26.81);Other abnormalities of gait and mobility (R26.89);Muscle weakness (generalized) (M62.81);Difficulty in walking, not elsewhere classified (R26.2);Pain Pain - part of body:  (chest)     Time: 1035-1100 PT Time Calculation (min) (ACUTE ONLY): 25 min  Charges:    $Therapeutic Activity: 23-37 mins PT General Charges $$ ACUTE PT VISIT: 1 Visit                     Dorothyann Maier, DPT, CLT  Acute Rehabilitation Services Office: [phone] (Secure chat preferred)    Dorothyann VEAR Maier 04/10/2024, 12:27 PM

## 2024-04-10 NOTE — Progress Notes (Signed)
 Inpatient Rehab Admissions Coordinator:   Chart reviewed and note per MD pt mobilizing well and prefers discharge home.  Discussed with PT who is in agreement that [facility] is appropriate.  We will sign off for CIR.  I will update TOC.    Reche Lowers, PT, DPT Admissions Coordinator [phone] 04/10/24 11:51 AM

## 2024-04-10 NOTE — Progress Notes (Addendum)
 5 Days Post-Op Procedures (LRB): CORONARY ARTERY BYPASS GRAFTING X 3, USING LEFT INTERNAL MAMMARY ARTERY AND RIGHT ENDOSCOPIC HARVESTED GREATER SAPHENOUS VEIN (N/A) CLIPPING, LEFT ATRIAL APPENDAGE USING ATRICLIP (N/A) ECHOCARDIOGRAM, TRANSESOPHAGEAL, INTRAOPERATIVE (N/A) ISOLATION, VEIN, PULMONARY, BILATERAL Subjective: Doing well this morning, remains in normal sinus rhythm Ambulated 300 ft with RN Moving bowels but doesn't remember Awaiting bed on 4E No delirium issues overnight  Objective: Vital signs in last 24 hours: BP (!) 155/67   Pulse 84   Temp 97.8 F (36.6 C) (Oral)   Resp 15   Ht 5' 10 (1.778 m)   Wt 76.1 kg   SpO2 91%   BMI 24.07 kg/m  Filed Weights   04/07/24 0500 04/09/24 0500 04/10/24 0500  Weight: 81.5 kg 75.8 kg 76.1 kg    Hemodynamic parameters for last 24 hours:    Intake/Output from previous day: 01/25 0701 - 01/26 0700 In: 300 [P.O.:300] Out: 950 [Urine:950] Intake/Output this shift: No intake/output data recorded.  Physical Exam: General - Resting comfortably in chair CV - RRR, sternotomy incision CDI with no drainage Resp - Unlabored on RA Abd - Soft, ND/NT Ext - Mild edema  Lab Results:    Latest Ref Rng & Units 04/09/2024    4:47 AM 04/08/2024    4:43 AM 04/07/2024    5:30 AM  CBC  WBC 4.0 - 10.5 K/uL 10.1  13.2  13.5   Hemoglobin 12.0 - 15.0 g/dL 7.4  7.5  7.3   Hematocrit 36.0 - 46.0 % 21.4  21.9  22.3   Platelets 150 - 400 K/uL 159  159  142       Latest Ref Rng & Units 04/09/2024    4:47 AM 04/08/2024    4:43 AM 04/07/2024    5:30 AM  CMP  Glucose 70 - 99 mg/dL 96  868  883   BUN 8 - 23 mg/dL 22  24  19    Creatinine 0.44 - 1.00 mg/dL 9.22  9.08  9.34   Sodium 135 - 145 mmol/L 130  132  129   Potassium 3.5 - 5.1 mmol/L 3.7  4.0  4.7   Chloride 98 - 111 mmol/L 92  94  98   CO2 22 - 32 mmol/L 32  28  24   Calcium  8.9 - 10.3 mg/dL 8.3  8.0  8.4     CXR: Pending  Assessment/Plan: S/P Procedures (LRB): CORONARY  ARTERY BYPASS GRAFTING X 3, USING LEFT INTERNAL MAMMARY ARTERY AND RIGHT ENDOSCOPIC HARVESTED GREATER SAPHENOUS VEIN (N/A) CLIPPING, LEFT ATRIAL APPENDAGE USING ATRICLIP (N/A) ECHOCARDIOGRAM, TRANSESOPHAGEAL, INTRAOPERATIVE (N/A) ISOLATION, VEIN, PULMONARY, BILATERAL POD5 s/p CABG, LAAL and PVI NEURO- post-op delirium now resolved  Pain control PRN CV- in SR around 80-90 bpm             Continue metoprolol , multaq   Resume Eliquis  today RESP- Saturating well on RA             Continue IS, pulm hygiene, ambulation  Follow-up on 2V CXR RENAL- creatinine and lytes Ok  Weight unchanged from yesterday, still 5 lbs over pre-op             Continue lasix  40 po daily GI- tolerating regular diet Having BM Endo- BG well controlled, off ISS, no history of DM ID- NAI DVT ppx - SCD + HSQ 5000 TID  Dispo: 4E, should be ready for discharge by tomorrow, dispo pending PT/CIR evaluation - initially recommended for inpatient rehab but has improved significantly  since then.  Patient prefers to go home but open to rehab if needed.   LOS: 5 days    Con RAMAN Evian Derringer 04/10/2024

## 2024-04-10 NOTE — Plan of Care (Signed)

## 2024-04-10 NOTE — Progress Notes (Signed)
 Pt sts she has ambulated x3 today. Gave her OHS booklet. Discussed IS, sternal precautions, exercise, diet, and CRPII. Pt receptive. Will refer to [facility]. She has a RW at home.  [phone] Aliene Aris BS, ACSM-CEP 04/10/2024 2:00 PM

## 2024-04-10 NOTE — Progress Notes (Signed)
" ° °  [address], [place] [address]             [phone]    POD # 5  Still waiting on tele bed  BP (!) 115/40   Pulse 86   Temp 98.5 F (36.9 C) (Oral)   Resp 19   Ht 5' 10 (1.778 m)   Wt 76.1 kg   SpO2 95%   BMI 24.07 kg/m    Intake/Output Summary (Last 24 hours) at 04/10/2024 1652 Last data filed at 04/09/2024 2100 Gross per 24 hour  Intake --  Output 350 ml  Net -350 ml   Elspeth C. Kerrin, MD [facility] [phone]  "

## 2024-04-10 NOTE — Progress Notes (Signed)
 "  NAME:  ADYLENE DLUGOSZ, MRN:  [id number], DOB:  02-13-1946, LOS: 5 ADMISSION DATE:  04/05/2024, CONSULTATION DATE:  1/21 REFERRING MD:  SU, CHIEF COMPLAINT:  post CABG critical care    History of Present Illness:  79 year old female w/ prior h/o HTN, GERD and mild MR. Hospitalized Nov 2025 w/ new onset AF.  As part of evaluation underwent Coronary CT  12/17 and found to have svr CAD and subsequently underwent diagnostic left heart cath on 12/18. : Left main 50%, LAD 75%, D1 70%, Distal Cx 80%, Prox RCA 80%; TTE (11/20/23): Normal BiV function, mild-mod mitral regurgitation w/ hemodynamically significant prox LAD stenosis. Because she also had class  III angina was referred to cardiothoracic surgery. She was evaluated on 1/15, DOAC for her AF placed on hold and she presented for CABG w/ LA appendage clipping   Note multiple allergies:  Sulfa Antibiotics Codeine Morphine  Amiodarone  Meperidine Tramadol Hcl   OR events Bypass time 2h 26 m Clamp time 1hr 20 m EBL 885 Blood products/cell saver: 482 cell saver Fluids 2500 crystalloid/250 ml albumin    -tissue friable and oozy  -intra op nml bivent fxn w/ no valve issues  Pertinent  Medical History  PAF, HTN, GERD, mild MR.   Significant Hospital Events: Including procedures, antibiotic start and stop dates in addition to other pertinent events   1/21 3V CABG w left atrial appendage clipping   Interim History / Subjective:  Legs still feel heavy, but walking better.  Now on RA; no SOB. Afebrile. Appetite is improving.  Objective    Blood pressure (!) 155/67, pulse 84, temperature 97.8 F (36.6 C), temperature source Oral, resp. rate 15, height 5' 10 (1.778 m), weight 76.1 kg, SpO2 91%.        Intake/Output Summary (Last 24 hours) at 04/10/2024 0701 Last data filed at 04/09/2024 2100 Gross per 24 hour  Intake 300 ml  Output 950 ml  Net -650 ml   Filed Weights   04/07/24 0500 04/09/24 0500 04/10/24 0500  Weight: 81.5 kg 75.8 kg  76.1 kg    Examination: General: elderly woman sitting up in bed in NAD HENT: [place]/AT, eyes anicteric Lungs: breathing comfortably on RA, bibasilar rhales. No conversational dyspnea.  Cardiovascular: S1S2, RRR Abdomen: soft, NT Extremities:  minimal ongoing edema Neuro: awake, answering questions, moving all extremities  I/O -Lcc past 24 hrs, -2.3L for admission Wt +5# since admission  CXR personally reviewed> tiny residual pleural effusion, mild pulmonary edema   Resolved problem list  Constipation Hyperglycemia Assessment and Plan   Multi-vessel CAD now s/p CABG X 3 w/ left atrial appendage clipping w/ right SVG -post-op care per TCTS -con't lasix  once daily -con't aspirin -- decrease to baby aspirin  -start DOAC today -statin -metoprolol  BID -con't to progress diet and mobility; not moving much better -pulmonary hygiene  H/o paroxysmal Afib; s/p LAA clip  -con't dronedarone  -restart PTA DOAC  Post-op anemia, not clinically significant, not unexpected. Stable.  -enteral iron plus miralax  -transfuse for Hb <7 or hemodynamically significant bleeding  Poor appetite, PO intake> improving -encourage PO intake  Hyponatremia; improving -recheck BMP -avoid hypotonic fluids  H/o HTN -con't holding amlodipine , Toprol , Imdur   GERD -con't PTA pepcid    Deconditioning -PT, OT -con't walking in hallway with assistance   Stable to transfer to the floor.   Labs   CBC: Recent Labs  Lab 04/06/24 0345 04/06/24 1611 04/07/24 0530 04/08/24 0443 04/09/24 0447  WBC 7.3 9.6 13.5* 13.2* 10.1  HGB 7.5* 7.8* 7.3* 7.5* 7.4*  HCT 23.0* 24.0* 22.3* 21.9* 21.4*  MCV 87.8 89.9 89.9 85.9 85.6  PLT 107* 116* 142* 159 159    Basic Metabolic Panel: Recent Labs  Lab 04/05/24 2029 04/06/24 0345 04/06/24 1611 04/07/24 0530 04/08/24 0443 04/09/24 0447  NA 138 136 134* 129* 132* 130*  K 4.5 4.5 4.8 4.7 4.0 3.7  CL 105 104 102 98 94* 92*  CO2 23 23 25 24 28  32  GLUCOSE  146* 139* 151* 116* 131* 96  BUN 15 15 18 19  24* 22  CREATININE 0.71 0.67 0.70 0.65 0.91 0.77  CALCIUM  7.9* 7.9* 8.5* 8.4* 8.0* 8.3*  MG 3.2* 2.6* 2.5*  --  1.9 2.0   GFR: Estimated Creatinine Clearance: 64.7 mL/min (by C-G formula based on SCr of 0.77 mg/dL). Recent Labs  Lab 04/06/24 1611 04/07/24 0530 04/08/24 0443 04/09/24 0447  WBC 9.6 13.5* 13.2* 10.1       Critical care time   Leita SHAUNNA Gaskins, DO 04/10/24 8:58 AM [facility]  For contact information, see Amion. If no response to pager, please call PCCM 2H APP. After hours, 7PM- 7AM, please call on call APP for 2H.      "

## 2024-04-11 ENCOUNTER — Other Ambulatory Visit: Payer: Self-pay

## 2024-04-11 DIAGNOSIS — I499 Cardiac arrhythmia, unspecified: Secondary | ICD-10-CM | POA: Insufficient documentation

## 2024-04-11 DIAGNOSIS — I2510 Atherosclerotic heart disease of native coronary artery without angina pectoris: Secondary | ICD-10-CM

## 2024-04-11 MED ORDER — OXYCODONE HCL 5 MG PO TABS: 5.0000 mg | ORAL_TABLET | Freq: Four times a day (QID) | ORAL | 0 refills | Status: AC | PRN

## 2024-04-11 MED ORDER — FUROSEMIDE 20 MG PO TABS: 20.0000 mg | ORAL_TABLET | Freq: Every day | ORAL | 0 refills | Status: AC

## 2024-04-11 MED ORDER — POTASSIUM CHLORIDE CRYS ER 10 MEQ PO TBCR: 10.0000 meq | EXTENDED_RELEASE_TABLET | Freq: Every day | ORAL | 0 refills | Status: AC

## 2024-04-11 MED ORDER — METOPROLOL TARTRATE 25 MG PO TABS: 12.5000 mg | ORAL_TABLET | Freq: Two times a day (BID) | ORAL | 1 refills | Status: AC

## 2024-04-11 MED ORDER — FERROUS SULFATE 325 (65 FE) MG PO TABS: 325.0000 mg | ORAL_TABLET | Freq: Two times a day (BID) | ORAL | 3 refills | Status: AC

## 2024-04-11 NOTE — Progress Notes (Signed)
 6 Days Post-Op Procedures (LRB): CORONARY ARTERY BYPASS GRAFTING X 3, USING LEFT INTERNAL MAMMARY ARTERY AND RIGHT ENDOSCOPIC HARVESTED GREATER SAPHENOUS VEIN (N/A) CLIPPING, LEFT ATRIAL APPENDAGE USING ATRICLIP (N/A) ECHOCARDIOGRAM, TRANSESOPHAGEAL, INTRAOPERATIVE (N/A) ISOLATION, VEIN, PULMONARY, BILATERAL Subjective: Feels well  Objective: Vital signs in last 24 hours: Temp:  [97.6 F (36.4 C)-98.5 F (36.9 C)] 98.3 F (36.8 C) (01/27 0352) Pulse Rate:  [64-95] 64 (01/27 0352) Cardiac Rhythm: Normal sinus rhythm (01/26 1923) Resp:  [11-20] 18 (01/27 0352) BP: (108-138)/(40-88) 138/88 (01/27 0352) SpO2:  [90 %-100 %] 100 % (01/27 0352) Weight:  [74.4 kg] 74.4 kg (01/27 0349)  Hemodynamic parameters for last 24 hours:    Intake/Output from previous day: 01/26 0701 - 01/27 0700 In: -  Out: 1050 [Urine:1050] Intake/Output this shift: No intake/output data recorded.  General appearance: alert, cooperative, and no distress Neurologic: intact Heart: regular rate and rhythm Lungs: clear to auscultation bilaterally Abdomen: benign Wound: incis healing well, mod ecchymosis right thigh  Lab Results: Recent Labs    04/09/24 0447  WBC 10.1  HGB 7.4*  HCT 21.4*  PLT 159   BMET:  Recent Labs    04/09/24 0447  NA 130*  K 3.7  CL 92*  CO2 32  GLUCOSE 96  BUN 22  CREATININE 0.77  CALCIUM  8.3*    PT/INR: No results for input(s): LABPROT, INR in the last 72 hours. ABG    Component Value Date/Time   PHART 7.340 (L) 04/05/2024 2010   HCO3 16.2 (L) 04/05/2024 2010   TCO2 17 (L) 04/05/2024 2010   ACIDBASEDEF 9.0 (H) 04/05/2024 2010   O2SAT 98 04/05/2024 2010   CBG (last 3)  Recent Labs    04/08/24 2113 04/09/24 0633 04/10/24 1208  GLUCAP 111* 100* 84    Meds Scheduled Meds:  apixaban   5 mg Oral BID   aspirin  EC  81 mg Oral Daily   Chlorhexidine  Gluconate Cloth  6 each Topical Daily   docusate sodium   200 mg Oral Daily   dronedarone   400 mg Oral  BID WC   famotidine   20 mg Oral Daily   ferrous sulfate   325 mg Oral BID WC   furosemide   40 mg Oral Daily   lidocaine   1 patch Transdermal Q24H   metoprolol  tartrate  12.5 mg Oral BID   rosuvastatin   20 mg Oral Daily   sodium chloride  flush  3 mL Intravenous Q12H   Continuous Infusions:  sodium chloride      PRN Meds:.sodium chloride , ondansetron  (ZOFRAN ) IV, oxyCODONE , pneumococcal 20-valent conjugate vaccine, prochlorperazine , sodium chloride  flush  Xrays DG Chest 2 View Result Date: 04/10/2024 CLINICAL DATA:  Cardiac surgery. EXAM: CHEST - 2 VIEW COMPARISON:  04/07/2024. FINDINGS: Support apparatus has been removed in the interval. Trachea is midline. Heart is at the upper limits of normal in size to mildly enlarged. Thoracic aorta is calcified. Seven intact median sternotomy wires. Left atrial appendage clip. Improving bibasilar atelectasis with small bilateral loculated pleural effusions. No pneumothorax. IMPRESSION: Improving bibasilar atelectasis with small loculated bilateral pleural effusions. Electronically Signed   By: Newell Eke M.D.   On: 04/10/2024 10:45    Assessment/Plan: S/P Procedures (LRB): CORONARY ARTERY BYPASS GRAFTING X 3, USING LEFT INTERNAL MAMMARY ARTERY AND RIGHT ENDOSCOPIC HARVESTED GREATER SAPHENOUS VEIN (N/A) CLIPPING, LEFT ATRIAL APPENDAGE USING ATRICLIP (N/A) ECHOCARDIOGRAM, TRANSESOPHAGEAL, INTRAOPERATIVE (N/A) ISOLATION, VEIN, PULMONARY, BILATERAL POD#6  1 afeb, VSS, NSR, back on eliquis /multaq  for h/o afib, also on asa/bASA/statin- BP a little too labile  for ACE/ARB sBP range 100's-130's 2 O2 sats on RA 3 weight about 1 kg> preop- voiding well, unmeasured- will give 3 more days of 20 mg lasix  at d/c  4 CBG's well controlled- off SSI, -DM hx 5 no new labs or xrays- on iron for expected ABLA 6 appears stable for D/C- reviewed discharge instructions    LOS: 6 days    Lemond FORBES Cera PA-C Pager [phone] 04/11/2024

## 2024-04-11 NOTE — Progress Notes (Signed)

## 2024-04-11 NOTE — TOC Transition Note (Signed)
 Transition of Care (TOC) - Discharge Note Rayfield Gobble RN, BSN Inpatient Care Management Unit 4E- RN Case Manager See Treatment Team for direct phone #   Patient Details  Name: Ann Diaz MRN: [id number] Date of Birth: 09-17-1945  Transition of Care [facility]) CM/SW Contact:  Gobble Rayfield Hurst, RN Phone Number: 04/11/2024, 9:59 AM   Clinical Narrative:    Pt stable for transition home today, HH has been arranged with TCTS office protocol- [facility] to follow. Liaison w/ [facility] updated on d/c today for start of care.   Family to transport home. No DME needs noted.   IP CM interventions have been completed no further needs noted.   Final next level of care: Home w Home Health Services Barriers to Discharge: Barriers Resolved   Patient Goals and CMS Choice Patient states their goals for this hospitalization and ongoing recovery are:: plans to return home with home health services.   Choice offered to / list presented to :  (TCTS protocol- prearranged with [facility] and patient is agreeable)      Discharge Placement               Home w/ [facility]        Discharge Plan and Services Additional resources added to the After Visit Summary for   In-house Referral: NA Discharge Planning Services: CM Consult Post Acute Care Choice: Home Health          DME Arranged: N/A DME Agency: NA       HH Arranged: PT, OT HH Agency: [facility] ([facility] Date HH Agency Contacted: 04/10/24 Time HH Agency Contacted: 1623 Representative spoke with at [facility] Agency: TCTS Protocol  Social Drivers of Health (SDOH) Interventions SDOH Screenings   Food Insecurity: No Food Insecurity (04/06/2024)  Housing: Low Risk (04/06/2024)  Transportation Needs: No Transportation Needs (04/06/2024)  Utilities: Not At Risk (04/06/2024)  Social Connections: Socially Integrated (04/06/2024)  Tobacco Use: Low Risk (04/05/2024)     Readmission Risk Interventions    04/11/2024    9:59 AM   Readmission Risk Prevention Plan  Transportation Screening Complete  PCP or Specialist Appt within 5-7 Days Complete  Home Care Screening Complete  Medication Review (RN CM) Complete

## 2024-04-11 NOTE — Progress Notes (Signed)
 Occupational Therapy Treatment Patient Details Name: Ann Diaz MRN: [id number] DOB: [birth year] Today's Date: 04/11/2024   History of present illness 79 year old woman presented to her Cardiologist with atrial fibrillation, prompting a CT coronary scan which demonstrated significant calcium . 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 PMH: paroxysmal atrial fibrillation hypertension GERD and mild mitral regurgitation.   OT comments  Patient demonstrating good gains with bed mobility, transfers, and self care. Patient able to donn underwear and pants with setup and supervision. Patient able to perform grooming and functional transfer with supervision and no LOB. Patient is now expected to return home with Home Health to follow.       If plan is discharge home, recommend the following:  A lot of help with walking and/or transfers;A lot of help with bathing/dressing/bathroom;Assistance with cooking/housework;Assist for transportation;Supervision due to cognitive status   Equipment Recommendations  None recommended by OT    Recommendations for Other Services      Precautions / Restrictions Precautions Precautions: Sternal Recall of Precautions/Restrictions: Intact Precaution/Restrictions Comments: reviewed to move in the tube, no pushing, pulling lifting Restrictions Weight Bearing Restrictions Per Provider Order: Yes RUE Weight Bearing Per Provider Order: Non weight bearing LUE Weight Bearing Per Provider Order: Non weight bearing Other Position/Activity Restrictions: cardiac sternal precautions       Mobility Bed Mobility Overal bed mobility: Needs Assistance Bed Mobility: Rolling, Sidelying to Sit, Supine to Sit Rolling: Supervision Sidelying to sit: HOB elevated, Supervision Supine to sit: Supervision     General bed mobility comments: no physical assistance required    Transfers Overall transfer level: Needs  assistance Equipment used: Rolling walker (2 wheels) Transfers: Sit to/from Stand, Bed to chair/wheelchair/BSC Sit to Stand: Supervision           General transfer comment: performed mobility and transfers in room with supervision     Balance Overall balance assessment: Mild deficits observed, not formally tested, Needs assistance Sitting-balance support: Feet supported, No upper extremity supported Sitting balance-Leahy Scale: Good Sitting balance - Comments: EOB   Standing balance support: During functional activity, Reliant on assistive device for balance, Bilateral upper extremity supported Standing balance-Leahy Scale: Fair Standing balance comment: no LOB while standing at sink for grooming and LB dressing tasks                           ADL either performed or assessed with clinical judgement   ADL Overall ADL's : Needs assistance/impaired     Grooming: Wash/dry hands;Wash/dry face;Oral care;Brushing hair;Supervision/safety;Standing Grooming Details (indicate cue type and reason): at sink             Lower Body Dressing: Supervision/safety;Sit to/from stand Lower Body Dressing Details (indicate cue type and reason): donned underwear and pants with supervision Toilet Transfer: Supervision/safety;Ambulation;Rolling walker (2 wheels) Toilet Transfer Details (indicate cue type and reason): simulated         Functional mobility during ADLs: Supervision/safety;Rolling walker (2 wheels)      Extremity/Trunk Assessment              Vision       Perception     Praxis     Communication Communication Communication: No apparent difficulties   Cognition Arousal: Alert Behavior During Therapy: WFL for tasks assessed/performed Cognition: No apparent impairments  Following commands: Intact        Cueing   Cueing Techniques: Verbal cues  Exercises      Shoulder Instructions       General  Comments VSS on RA    Pertinent Vitals/ Pain       Pain Assessment Pain Assessment: Faces Faces Pain Scale: Hurts a little bit Pain Location: chest Pain Descriptors / Indicators: Guarding, Discomfort Pain Intervention(s): Monitored during session  Home Living                                          Prior Functioning/Environment              Frequency  Min 2X/week        Progress Toward Goals  OT Goals(current goals can now be found in the care plan section)  Progress towards OT goals: Progressing toward goals  Acute Rehab OT Goals Patient Stated Goal: to go home OT Goal Formulation: With patient/family Time For Goal Achievement: 04/21/24 Potential to Achieve Goals: Good ADL Goals Pt Will Perform Grooming: with contact guard assist;standing Pt Will Perform Upper Body Dressing: with supervision;sitting Pt Will Perform Lower Body Dressing: with supervision;sit to/from stand Pt Will Transfer to Toilet: with supervision;ambulating;regular height toilet Pt Will Perform Toileting - Clothing Manipulation and hygiene: with supervision;sit to/from stand  Plan      Co-evaluation                 AM-PAC OT 6 Clicks Daily Activity     Outcome Measure   Help from another person eating meals?: None Help from another person taking care of personal grooming?: A Little Help from another person toileting, which includes using toliet, bedpan, or urinal?: A Little Help from another person bathing (including washing, rinsing, drying)?: A Little Help from another person to put on and taking off regular upper body clothing?: A Little Help from another person to put on and taking off regular lower body clothing?: A Little 6 Click Score: 19    End of Session Equipment Utilized During Treatment: Gait belt;Rolling walker (2 wheels)  OT Visit Diagnosis: Unsteadiness on feet (R26.81);Other abnormalities of gait and mobility (R26.89);Muscle weakness  (generalized) (M62.81)   Activity Tolerance Patient tolerated treatment well   Patient Left in bed;with call bell/phone within reach;with family/visitor present   Nurse Communication Mobility status        Time: 9062-8997 OT Time Calculation (min): 25 min  Charges: OT General Charges $OT Visit: 1 Visit OT Treatments $Self Care/Home Management : 23-37 mins  Dick Laine, OTA Acute Rehabilitation Services  Office [phone]   Jeb LITTIE Laine 04/11/2024, 1:42 PM

## 2024-04-12 ENCOUNTER — Telehealth: Payer: Self-pay | Admitting: *Deleted

## 2024-04-12 NOTE — Transitions of Care (Post Inpatient/ED Visit) (Signed)
 "  04/12/2024  Name: Ann Diaz MRN: [id number] DOB: Jul 05, 1945  Today's TOC FU Call Status: Today's TOC FU Call Status:: Successful TOC FU Call Completed TOC FU Call Complete Date: 04/12/24  Patient's Name and Date of Birth confirmed. Name, DOB  Transition Care Management Follow-up Telephone Call Date of Discharge: 04/11/24 Discharge Facility: [facility]) Type of Discharge: Inpatient Admission Primary Inpatient Discharge Diagnosis:: CORONARY ARTERY BYPASS GRAFTING X 3, How have you been since you were released from the hospital?: Better Any questions or concerns?: No  Items Reviewed: Did you receive and understand the discharge instructions provided?: Yes Medications obtained,verified, and reconciled?: Yes (Medications Reviewed) Any new allergies since your discharge?: No Dietary orders reviewed?: Yes Type of Diet Ordered:: low sodium, heart healthy Do you have support at home?: Yes People in Home [RPT]: spouse Name of Support/Comfort Primary Source: Spouse/Lewis  Medications Reviewed Today: Medications Reviewed Today     Reviewed by Lucky Andrea DELENA, RN (Registered Nurse) on 04/12/24 at 1127  Med List Status: <None>   Medication Order Taking? Sig Documenting Provider Last Dose Status Informant  apixaban  (ELIQUIS ) 5 MG TABS tablet [id number] Yes Take 1 tablet (5 mg total) by mouth 2 (two) times daily. Ladona Heinz, MD  Active Self  aspirin  EC 81 MG tablet [id number] Yes Take 81 mg by mouth daily. Swallow whole. [provider]  Active Self  Biotin 5000 MCG CAPS [id number] Yes Take 5,000 mcg by mouth daily. [provider]  Active Self  Calcium  Carb-Cholecalciferol (CALCIUM  600+D3 PO) [id number] Yes Take 1 tablet by mouth in the morning. [provider]  Active Self  cyclobenzaprine  (FLEXERIL ) 5 MG tablet [id number] Yes Take 1 tablet (5 mg total) by mouth 3 (three) times daily as needed for muscle spasms.  Patient taking differently: Take 5 mg by  mouth at bedtime as needed for muscle spasms.   Mavis Purchase, MD  Active Self  diclofenac Sodium (VOLTAREN) 1 % GEL [id number] Yes Apply 1 Application topically daily as needed (shoulder pain). [provider]  Active Self  dronedarone  (MULTAQ ) 400 MG tablet [id number] Yes Take 1 tablet (400 mg total) by mouth 2 (two) times daily with a meal. Revankar, Jennifer SAUNDERS, MD  Active Self  famotidine  (PEPCID ) 20 MG tablet [id number] Yes Take 20 mg by mouth daily as needed for heartburn or indigestion. [provider]  Active Self  ferrous sulfate  325 (65 FE) MG tablet [id number] Yes Take 1 tablet (325 mg total) by mouth 2 (two) times daily with a meal. Gold, Wayne E, PA-C  Active   fexofenadine (ALLEGRA) 180 MG tablet [id number] Yes Take 180 mg by mouth daily as needed for allergies or rhinitis. [provider]  Active Self  furosemide  (LASIX ) 20 MG tablet [id number] Yes Take 1 tablet (20 mg total) by mouth daily. Gold, Wayne E, PA-C  Active   Green Tea, Camellia sinensis, (GREEN TEA PO) [id number]  Take 2 tablets by mouth daily. Fat burner  Patient not taking: Reported on 04/12/2024   [provider]  Active Self  metoprolol  tartrate (LOPRESSOR ) 25 MG tablet [id number] Yes Take 0.5 tablets (12.5 mg total) by mouth 2 (two) times daily. Gold, Wayne E, PA-C  Active   oxyCODONE  (OXY IR/ROXICODONE ) 5 MG immediate release tablet [id number]  Take 1 tablet (5 mg total) by mouth every 6 (six) hours as needed for severe pain (pain score 7-10).  Patient not taking: Reported on 04/12/2024   Gold, Wayne E, PA-C  Active  oxymetazoline  (AFRIN) 0.05 % nasal spray [id number] Yes Place 1-2 sprays into both nostrils daily as needed for congestion. [provider]  Active Self  potassium chloride  (KLOR-CON  M) 10 MEQ tablet [id number] Yes Take 1 tablet (10 mEq total) by mouth daily. Gold, Wayne E, PA-C  Active   rosuvastatin  (CRESTOR ) 20 MG tablet [id number] Yes Take 1 tablet (20 mg total) by  mouth daily. Revankar, Jennifer SAUNDERS, MD  Active Self            Home Care and Equipment/Supplies: Were Home Health Services Ordered?: Yes Name of Home Health Agency:: [facility] Has Agency set up a time to come to your home?: Yes First Home Health Visit Date: 04/12/24 Any new equipment or medical supplies ordered?: No  Functional Questionnaire: Do you need assistance with bathing/showering or dressing?: Yes (Dtr assists with shower) Do you need assistance with meal preparation?: Yes (Family prepares meals) Do you need assistance with eating?: No Do you have difficulty maintaining continence: No Do you need assistance with getting out of bed/getting out of a chair/moving?: Yes (Family assists) Do you have difficulty managing or taking your medications?: No  Follow up appointments reviewed: PCP Follow-up appointment confirmed?: No (Patient will call to schedule) MD Provider Line [phone] Given: No Specialist Hospital Follow-up appointment confirmed?: Yes Date of Specialist follow-up appointment?: 04/17/24 Follow-Up Specialty Provider:: Thoracic Surgeon office Do you need transportation to your follow-up appointment?: No Do you understand care options if your condition(s) worsen?: Yes-patient verbalized understanding  SDOH Interventions Today    Flowsheet Row Most Recent Value  SDOH Interventions   Food Insecurity Interventions Intervention Not Indicated  Housing Interventions Intervention Not Indicated  Transportation Interventions Intervention Not Indicated  Utilities Interventions Intervention Not Indicated    Andrea Dimes RN, BSN [place]  [facility] RN Care Manager [phone]  "

## 2024-04-14 ENCOUNTER — Encounter (HOSPITAL_COMMUNITY): Payer: Self-pay

## 2024-04-17 ENCOUNTER — Ambulatory Visit: Payer: Self-pay

## 2024-04-17 ENCOUNTER — Ambulatory Visit (HOSPITAL_COMMUNITY)

## 2024-04-20 ENCOUNTER — Ambulatory Visit: Admitting: Cardiology

## 2024-04-25 ENCOUNTER — Other Ambulatory Visit (HOSPITAL_COMMUNITY)

## 2024-04-25 ENCOUNTER — Ambulatory Visit: Payer: Self-pay

## 2024-04-27 ENCOUNTER — Ambulatory Visit: Admitting: Cardiology

## 2024-05-01 ENCOUNTER — Ambulatory Visit: Admitting: Cardiology
